# Patient Record
Sex: Female | Born: 1950
Health system: Southern US, Community
[De-identification: ages and names within clinical notes are randomized; demographics above are authoritative.]

## PROBLEM LIST (undated history)

## (undated) DIAGNOSIS — Z8619 Personal history of other infectious and parasitic diseases: Secondary | ICD-10-CM

## (undated) DIAGNOSIS — T7840XA Allergy, unspecified, initial encounter: Secondary | ICD-10-CM

## (undated) DIAGNOSIS — Z8669 Personal history of other diseases of the nervous system and sense organs: Secondary | ICD-10-CM

## (undated) DIAGNOSIS — Z8709 Personal history of other diseases of the respiratory system: Secondary | ICD-10-CM

## (undated) DIAGNOSIS — S0990XA Unspecified injury of head, initial encounter: Secondary | ICD-10-CM

## (undated) DIAGNOSIS — K449 Diaphragmatic hernia without obstruction or gangrene: Secondary | ICD-10-CM

## (undated) DIAGNOSIS — F419 Anxiety disorder, unspecified: Secondary | ICD-10-CM

## (undated) DIAGNOSIS — I499 Cardiac arrhythmia, unspecified: Secondary | ICD-10-CM

## (undated) DIAGNOSIS — E119 Type 2 diabetes mellitus without complications: Secondary | ICD-10-CM

## (undated) DIAGNOSIS — K5790 Diverticulosis of intestine, part unspecified, without perforation or abscess without bleeding: Secondary | ICD-10-CM

## (undated) DIAGNOSIS — I10 Essential (primary) hypertension: Secondary | ICD-10-CM

## (undated) DIAGNOSIS — N200 Calculus of kidney: Secondary | ICD-10-CM

## (undated) DIAGNOSIS — M1990 Unspecified osteoarthritis, unspecified site: Secondary | ICD-10-CM

## (undated) DIAGNOSIS — Z9289 Personal history of other medical treatment: Secondary | ICD-10-CM

## (undated) DIAGNOSIS — E785 Hyperlipidemia, unspecified: Secondary | ICD-10-CM

## (undated) DIAGNOSIS — Z872 Personal history of diseases of the skin and subcutaneous tissue: Secondary | ICD-10-CM

## (undated) HISTORY — DX: Unspecified injury of head, initial encounter: S09.90XA

## (undated) HISTORY — DX: Cardiac arrhythmia, unspecified: I49.9

## (undated) HISTORY — DX: Personal history of other medical treatment: Z92.89

## (undated) HISTORY — PX: CATARACT EXTRACTION: SUR2

## (undated) HISTORY — DX: Allergy, unspecified, initial encounter: T78.40XA

## (undated) HISTORY — DX: Type 2 diabetes mellitus without complications: E11.9

## (undated) HISTORY — DX: Personal history of diseases of the skin and subcutaneous tissue: Z87.2

## (undated) HISTORY — DX: Essential (primary) hypertension: I10

## (undated) HISTORY — PX: CARPAL TUNNEL RELEASE: SHX101

## (undated) HISTORY — DX: Diverticulosis of intestine, part unspecified, without perforation or abscess without bleeding: K57.90

## (undated) HISTORY — PX: TRIGGER FINGER RELEASE: SHX641

## (undated) HISTORY — DX: Personal history of other diseases of the nervous system and sense organs: Z86.69

## (undated) HISTORY — PX: TUBAL LIGATION: SHX77

## (undated) HISTORY — DX: Personal history of other diseases of the respiratory system: Z87.09

## (undated) HISTORY — DX: Personal history of other infectious and parasitic diseases: Z86.19

## (undated) HISTORY — DX: Diaphragmatic hernia without obstruction or gangrene: K44.9

## (undated) HISTORY — DX: Hyperlipidemia, unspecified: E78.5

## (undated) HISTORY — PX: EYE SURGERY: SHX253

## (undated) HISTORY — DX: Anxiety disorder, unspecified: F41.9

## (undated) HISTORY — DX: Calculus of kidney: N20.0

## (undated) HISTORY — DX: Unspecified osteoarthritis, unspecified site: M19.90

---

## 2002-12-17 LAB — HM COLONOSCOPY: HM Colonoscopy: NORMAL

## 2003-05-03 ENCOUNTER — Other Ambulatory Visit: Payer: Self-pay

## 2004-01-17 ENCOUNTER — Ambulatory Visit: Payer: Self-pay | Admitting: Gastroenterology

## 2011-03-04 DIAGNOSIS — S0990XA Unspecified injury of head, initial encounter: Secondary | ICD-10-CM

## 2011-03-04 HISTORY — DX: Unspecified injury of head, initial encounter: S09.90XA

## 2011-12-22 LAB — HM MAMMOGRAPHY: HM Mammogram: NORMAL

## 2012-12-14 LAB — HM PAP SMEAR: HM Pap smear: NEGATIVE

## 2012-12-14 LAB — HM MAMMOGRAPHY: HM Mammogram: NORMAL

## 2012-12-16 ENCOUNTER — Other Ambulatory Visit (HOSPITAL_COMMUNITY)
Admission: RE | Admit: 2012-12-16 | Discharge: 2012-12-16 | Disposition: A | Discharge status: Home / Self Care | Payer: Federal, State, Local not specified - PPO | Source: Ambulatory Visit | Attending: Internal Medicine | Admitting: Internal Medicine

## 2012-12-16 ENCOUNTER — Ambulatory Visit (INDEPENDENT_AMBULATORY_CARE_PROVIDER_SITE_OTHER): Payer: Federal, State, Local not specified - PPO | Admitting: Internal Medicine

## 2012-12-16 ENCOUNTER — Encounter: Payer: Self-pay | Admitting: Internal Medicine

## 2012-12-16 ENCOUNTER — Encounter (INDEPENDENT_AMBULATORY_CARE_PROVIDER_SITE_OTHER): Payer: Self-pay

## 2012-12-16 VITALS — BP 142/80 | HR 55 | Temp 97.9°F | Ht 64.25 in | Wt 160.5 lb

## 2012-12-16 DIAGNOSIS — R200 Anesthesia of skin: Secondary | ICD-10-CM | POA: Insufficient documentation

## 2012-12-16 DIAGNOSIS — Z01419 Encounter for gynecological examination (general) (routine) without abnormal findings: Secondary | ICD-10-CM | POA: Insufficient documentation

## 2012-12-16 DIAGNOSIS — Z23 Encounter for immunization: Secondary | ICD-10-CM

## 2012-12-16 DIAGNOSIS — R7309 Other abnormal glucose: Secondary | ICD-10-CM

## 2012-12-16 DIAGNOSIS — I10 Essential (primary) hypertension: Secondary | ICD-10-CM | POA: Insufficient documentation

## 2012-12-16 DIAGNOSIS — R209 Unspecified disturbances of skin sensation: Secondary | ICD-10-CM

## 2012-12-16 DIAGNOSIS — L409 Psoriasis, unspecified: Secondary | ICD-10-CM | POA: Insufficient documentation

## 2012-12-16 DIAGNOSIS — Z0000 Encounter for general adult medical examination without abnormal findings: Secondary | ICD-10-CM

## 2012-12-16 DIAGNOSIS — Z1151 Encounter for screening for human papillomavirus (HPV): Secondary | ICD-10-CM | POA: Insufficient documentation

## 2012-12-16 DIAGNOSIS — Z79899 Other long term (current) drug therapy: Secondary | ICD-10-CM

## 2012-12-16 DIAGNOSIS — R739 Hyperglycemia, unspecified: Secondary | ICD-10-CM | POA: Insufficient documentation

## 2012-12-16 DIAGNOSIS — L408 Other psoriasis: Secondary | ICD-10-CM

## 2012-12-16 LAB — COMPREHENSIVE METABOLIC PANEL
ALT: 18 U/L (ref 0–35)
AST: 25 U/L (ref 0–37)
Albumin: 4.1 g/dL (ref 3.5–5.2)
Alkaline Phosphatase: 58 U/L (ref 39–117)
Creatinine, Ser: 0.6 mg/dL (ref 0.4–1.2)
Glucose, Bld: 116 mg/dL — ABNORMAL HIGH (ref 70–99)
Potassium: 4 mEq/L (ref 3.5–5.1)
Sodium: 141 mEq/L (ref 135–145)
Total Bilirubin: 1.1 mg/dL (ref 0.3–1.2)
Total Protein: 7 g/dL (ref 6.0–8.3)

## 2012-12-16 LAB — LIPID PANEL
Cholesterol: 225 mg/dL — ABNORMAL HIGH (ref 0–200)
HDL: 60.4 mg/dL (ref >39.00)
Total CHOL/HDL Ratio: 4

## 2012-12-16 LAB — CBC WITH DIFFERENTIAL/PLATELET
Basophils Absolute: 0 10*3/uL (ref 0.0–0.1)
Basophils Relative: 0.6 % (ref 0.0–3.0)
Eosinophils Absolute: 0.1 10*3/uL (ref 0.0–0.7)
HCT: 41 % (ref 36.0–46.0)
MCHC: 33.8 g/dL (ref 30.0–36.0)
MCV: 89.3 fl (ref 78.0–100.0)
Monocytes Absolute: 0.4 10*3/uL (ref 0.1–1.0)
Neutro Abs: 3.1 10*3/uL (ref 1.4–7.7)
Neutrophils Relative %: 54.9 % (ref 43.0–77.0)
RBC: 4.6 Mil/uL (ref 3.87–5.11)
RDW: 12.8 % (ref 11.5–14.6)

## 2012-12-16 LAB — LDL CHOLESTEROL, DIRECT: Direct LDL: 149.5 mg/dL

## 2012-12-16 LAB — MICROALBUMIN / CREATININE URINE RATIO: Microalb, Ur: 0.3 mg/dL (ref 0.0–1.9)

## 2012-12-16 LAB — TSH: TSH: 1.29 u[IU]/mL (ref 0.35–5.50)

## 2012-12-16 LAB — HEMOGLOBIN A1C: Hgb A1c MFr Bld: 6.1 % (ref 4.6–6.5)

## 2012-12-16 MED ORDER — LOSARTAN POTASSIUM 100 MG PO TABS: 100.0000 mg | ORAL_TABLET | Freq: Every day | ORAL | Status: DC

## 2012-12-16 NOTE — Assessment & Plan Note (Signed)
Exam normal. Unclear etiology of paresthesia. Will check B12, TSH, CMP, CBC, A1c with labs.

## 2012-12-16 NOTE — Addendum Note (Signed)
Addended by: Montine Circle D on: 12/16/2012 08:46 AM   Modules accepted: Orders

## 2012-12-16 NOTE — Assessment & Plan Note (Signed)
Symptoms appear to be well controlled. Will continue to monitor.

## 2012-12-16 NOTE — Assessment & Plan Note (Signed)
Last A1c 6%. Will recheck A1c with labs today.

## 2012-12-16 NOTE — Assessment & Plan Note (Signed)
BP slightly elevated today, however much lower at home by report. Will change Losartan-HCTZ to plain Losartan given low BP at home and dehydration with elevated BUN/Cr based on recent labs by former PCP. Repeat labs today. Follow up 4 weeks and prn.

## 2012-12-16 NOTE — Progress Notes (Signed)
Subjective:    Patient ID: Molly Gregory, female    DOB: 11-Mar-1950, 62 y.o.   MRN: [id number]  HPI 62YO female with h/o hypertension presents to establish care and for physical exam. She is generally feeling well. No concerns today. However, brings record of labs from former PCP 05/2012 which show elevated BUN/Cr ratio. She notes BP at home often low, <100/60, and she occasionally feels lightheaded during these times. Questions if she could stop the HCTZ.  Also concerned about occasional numbness or tingling in her toes. This comes and goes. No h/o wounds on her feet. Was noted to have elevated BG, and last A1c 6%. Never on medication for BG.  Due for mammogram. Uncertain if due for colonoscopy. Due for influenza vaccine.  Outpatient Encounter Prescriptions as of 12/16/2012  Medication Sig Dispense Refill  . Calcium Carbonate-Vitamin D (CALCIUM + D PO) Take by mouth daily.      . citalopram (CELEXA) 10 MG tablet Take 10 mg by mouth daily.      Marland Kitchen losartan (COZAAR) 100 MG tablet Take 1 tablet (100 mg total) by mouth daily.  90 tablet  3  . propranolol ER (INDERAL LA) 80 MG 24 hr capsule Take 80 mg by mouth daily.      . psyllium (METAMUCIL) 58.6 % packet Take 1 packet by mouth daily.      . [DISCONTINUED] losartan-hydrochlorothiazide (HYZAAR) 100-25 MG per tablet Take 1 tablet by mouth daily.       No facility-administered encounter medications on file as of 12/16/2012.   BP 142/80  Pulse 55  Temp(Src) 97.9 F (36.6 C) (Oral)  Ht 5' 4.25" (1.632 m)  Wt 160 lb 8 oz (72.802 kg)  BMI 27.33 kg/m2  SpO2 95%  Review of Systems  Constitutional: Negative for fever, chills, appetite change, fatigue and unexpected weight change.  HENT: Negative for congestion, ear pain, sinus pressure, sore throat, trouble swallowing and voice change.   Eyes: Negative for visual disturbance.  Respiratory: Negative for cough, shortness of breath, wheezing and stridor.   Cardiovascular: Negative for chest  pain, palpitations and leg swelling.  Gastrointestinal: Negative for nausea, vomiting, abdominal pain, diarrhea, constipation, blood in stool, abdominal distention and anal bleeding.  Genitourinary: Negative for dysuria and flank pain.  Musculoskeletal: Negative for arthralgias, gait problem, myalgias and neck pain.  Skin: Negative for color change and rash.  Neurological: Positive for numbness. Negative for dizziness and headaches.  Hematological: Negative for adenopathy. Does not bruise/bleed easily.  Psychiatric/Behavioral: Negative for suicidal ideas, sleep disturbance and dysphoric mood. The patient is not nervous/anxious.        Objective:   Physical Exam  Constitutional: She is oriented to person, place, and time. She appears well-developed and well-nourished. No distress.  HENT:  Head: Normocephalic and atraumatic.  Right Ear: External ear normal.  Left Ear: External ear normal.  Nose: Nose normal.  Mouth/Throat: Oropharynx is clear and moist. No oropharyngeal exudate.  Eyes: Conjunctivae are normal. Pupils are equal, round, and reactive to light. Right eye exhibits no discharge. Left eye exhibits no discharge. No scleral icterus.  Neck: Normal range of motion. Neck supple. No tracheal deviation present. No thyromegaly present.  Cardiovascular: Normal rate, regular rhythm, normal heart sounds and intact distal pulses.  Exam reveals no gallop and no friction rub.   No murmur heard. Pulmonary/Chest: Effort normal and breath sounds normal. No accessory muscle usage. Not tachypneic. No respiratory distress. She has no decreased breath sounds. She has no wheezes. She has  no rhonchi. She has no rales. She exhibits no tenderness.  Abdominal: Soft. Bowel sounds are normal. She exhibits no distension and no mass. There is no tenderness. There is no rebound and no guarding.  Genitourinary: Rectum normal, vagina normal and uterus normal. No breast swelling, tenderness, discharge or bleeding.  Pelvic exam was performed with patient supine. There is no rash, tenderness or lesion on the right labia. There is no rash, tenderness or lesion on the left labia. Uterus is not enlarged and not tender. Cervix exhibits no motion tenderness, no discharge and no friability. Right adnexum displays no mass, no tenderness and no fullness. Left adnexum displays no mass, no tenderness and no fullness. No erythema or tenderness around the vagina. No vaginal discharge found.  Musculoskeletal: Normal range of motion. She exhibits no edema and no tenderness.  Lymphadenopathy:    She has no cervical adenopathy.  Neurological: She is alert and oriented to person, place, and time. No cranial nerve deficit. She exhibits normal muscle tone. Coordination normal.  Skin: Skin is warm and dry. No rash noted. She is not diaphoretic. No erythema. No pallor.  Psychiatric: She has a normal mood and affect. Her behavior is normal. Judgment and thought content normal.          Assessment & Plan:

## 2012-12-21 ENCOUNTER — Encounter: Payer: Self-pay | Admitting: Internal Medicine

## 2012-12-27 ENCOUNTER — Encounter: Payer: Self-pay | Admitting: Internal Medicine

## 2013-01-03 ENCOUNTER — Ambulatory Visit (INDEPENDENT_AMBULATORY_CARE_PROVIDER_SITE_OTHER): Payer: Federal, State, Local not specified - PPO | Admitting: Internal Medicine

## 2013-01-03 ENCOUNTER — Encounter: Payer: Self-pay | Admitting: Internal Medicine

## 2013-01-03 VITALS — BP 140/80 | HR 56 | Temp 98.4°F | Ht 64.25 in | Wt 165.5 lb

## 2013-01-03 DIAGNOSIS — I10 Essential (primary) hypertension: Secondary | ICD-10-CM

## 2013-01-03 DIAGNOSIS — J0100 Acute maxillary sinusitis, unspecified: Secondary | ICD-10-CM

## 2013-01-03 MED ORDER — CITALOPRAM HYDROBROMIDE 10 MG PO TABS: 10.0000 mg | ORAL_TABLET | Freq: Every day | ORAL | Status: DC

## 2013-01-03 MED ORDER — DOXYCYCLINE HYCLATE 100 MG PO TABS: 100.0000 mg | ORAL_TABLET | Freq: Two times a day (BID) | ORAL | Status: DC

## 2013-01-03 MED ORDER — PROPRANOLOL HCL ER 80 MG PO CP24: 80.0000 mg | ORAL_CAPSULE | Freq: Every day | ORAL | Status: DC

## 2013-01-03 NOTE — Progress Notes (Signed)
Subjective:    Patient ID: Molly Gregory, female    DOB: 1950/08/03, 62 y.o.   MRN: [id number]  HPI 62YO female presents to follow up hypertension. Seen 2 weeks ago, HCTZ stopped because of episodes of lightheadedness and dehydration noted on labs. Tolerating Losartan and Propranolol well. BP typically 110-120s/60s. No headache, chest pain, dyspnea, palpitations.  Concerned today about 2 weeks of maxillary sinus pressure, pain, purulent nasal congestion. Using saline washes with no improvement. Also notes occasional dizziness. No ear pain or change in hearing. NO fever, chills, cough, dyspnea.   Outpatient Encounter Prescriptions as of 01/03/2013  Medication Sig  . Calcium Carbonate-Vitamin D (CALCIUM + D PO) Take by mouth daily.  . citalopram (CELEXA) 10 MG tablet Take 1 tablet (10 mg total) by mouth daily.  Marland Kitchen losartan (COZAAR) 100 MG tablet Take 1 tablet (100 mg total) by mouth daily.  . Multiple Vitamins-Minerals (WOMENS MULTIVITAMIN PLUS PO) Take by mouth.  . propranolol ER (INDERAL LA) 80 MG 24 hr capsule Take 1 capsule (80 mg total) by mouth daily.  . psyllium (METAMUCIL) 58.6 % packet Take 1 packet by mouth daily.   BP 140/80  Pulse 56  Temp(Src) 98.4 F (36.9 C) (Oral)  Ht 5' 4.25" (1.632 m)  Wt 165 lb 8 oz (75.07 kg)  BMI 28.19 kg/m2  SpO2 97%  Review of Systems  Constitutional: Negative for fever, chills and unexpected weight change.  HENT: Positive for congestion, postnasal drip, rhinorrhea and sinus pressure. Negative for ear discharge, ear pain, facial swelling, hearing loss, mouth sores, nosebleeds, sneezing, sore throat, tinnitus, trouble swallowing and voice change.   Eyes: Negative for pain, discharge, redness and visual disturbance.  Respiratory: Negative for cough, chest tightness, shortness of breath, wheezing and stridor.   Cardiovascular: Negative for chest pain, palpitations and leg swelling.  Musculoskeletal: Negative for arthralgias, myalgias, neck pain  and neck stiffness.  Skin: Negative for color change and rash.  Neurological: Negative for dizziness, weakness, light-headedness and headaches.  Hematological: Negative for adenopathy.       Objective:   Physical Exam  Constitutional: She is oriented to person, place, and time. She appears well-developed and well-nourished. No distress.  HENT:  Head: Normocephalic and atraumatic.  Right Ear: External ear normal. Tympanic membrane is erythematous and bulging. A middle ear effusion is present.  Left Ear: External ear normal. Tympanic membrane is erythematous and bulging. A middle ear effusion is present.  Nose: Mucosal edema present. Right sinus exhibits maxillary sinus tenderness. Left sinus exhibits maxillary sinus tenderness.  Mouth/Throat: Oropharynx is clear and moist. No oropharyngeal exudate.  Eyes: Conjunctivae are normal. Pupils are equal, round, and reactive to light. Right eye exhibits no discharge. Left eye exhibits no discharge. No scleral icterus.  Neck: Normal range of motion. Neck supple. No tracheal deviation present. No thyromegaly present.  Cardiovascular: Normal rate, regular rhythm, normal heart sounds and intact distal pulses.  Exam reveals no gallop and no friction rub.   No murmur heard. Pulmonary/Chest: Effort normal and breath sounds normal. No accessory muscle usage. Not tachypneic. No respiratory distress. She has no decreased breath sounds. She has no wheezes. She has no rhonchi. She has no rales. She exhibits no tenderness.  Musculoskeletal: Normal range of motion. She exhibits no edema and no tenderness.  Lymphadenopathy:    She has no cervical adenopathy.  Neurological: She is alert and oriented to person, place, and time. No cranial nerve deficit. She exhibits normal muscle tone. Coordination normal.  Skin: Skin is  warm and dry. No rash noted. She is not diaphoretic. No erythema. No pallor.  Psychiatric: She has a normal mood and affect. Her behavior is  normal. Judgment and thought content normal.          Assessment & Plan:

## 2013-01-03 NOTE — Assessment & Plan Note (Signed)
Symptoms and exam consistent with acute maxillary sinusitis. Will start Doxycycline. Continue prn saline nasal wash and prn tylenol for pain. Follow up if symptoms not improving over the next week.

## 2013-01-03 NOTE — Assessment & Plan Note (Signed)
BP much better controlled at home 110-120s/60s, slightly elevated here likely related to anxiety with visit. Tolerating Losartan well. Will continue Losartan and Propranolol. Follow up 6 months and prn.

## 2013-02-03 ENCOUNTER — Telehealth: Payer: Self-pay | Admitting: Internal Medicine

## 2013-02-03 ENCOUNTER — Telehealth: Payer: Self-pay | Admitting: *Deleted

## 2013-02-03 MED ORDER — AMLODIPINE BESYLATE 2.5 MG PO TABS: 2.5000 mg | ORAL_TABLET | Freq: Every day | ORAL | Status: DC

## 2013-02-03 NOTE — Telephone Encounter (Signed)
Lets try adding Amlodipine 2.5mg  daily. #30 with 3 refills and bring her in next week for BP recheck.

## 2013-02-03 NOTE — Telephone Encounter (Signed)
The patient's blood pressure has been running high  . She was forwarded to Call a Nurse.

## 2013-02-03 NOTE — Telephone Encounter (Signed)
Spoke with patient, she state her BP medication was changed by Dr. Dan Humphreys. She was on Losartan/HCTZ but then she switched to regular Losartan without the diurectic. BP last night it was 177/65 and today it was 161/60 173/79. She spoke with pharmacist, she told her she may need to call her doctor office to see if she need to change her medication. No other symptoms

## 2013-02-03 NOTE — Telephone Encounter (Signed)
Patient informed and will call back to schedule an appointment. Rx sent to pharmacy on file per patient request.

## 2013-02-05 ENCOUNTER — Encounter: Payer: Self-pay | Admitting: Internal Medicine

## 2013-02-07 ENCOUNTER — Telehealth: Payer: Self-pay | Admitting: *Deleted

## 2013-02-07 ENCOUNTER — Ambulatory Visit (INDEPENDENT_AMBULATORY_CARE_PROVIDER_SITE_OTHER): Payer: Federal, State, Local not specified - PPO | Admitting: *Deleted

## 2013-02-07 ENCOUNTER — Encounter: Payer: Self-pay | Admitting: Internal Medicine

## 2013-02-07 VITALS — BP 178/70 | HR 52

## 2013-02-07 DIAGNOSIS — I10 Essential (primary) hypertension: Secondary | ICD-10-CM

## 2013-02-07 NOTE — Progress Notes (Signed)
Pt presents to office for blood pressure check. BP readings at home have been elevated. Started on Amlodipine 2.5 mg last week for elevated readings. Confirmed she is also taking her Losartan and Propanolol daily as directed. BP readings at home: 176/81 30 minutes after meds yesterday morning; 125/65; 161/80 yesterday afternoon; 175/79; and 180/81 this morning. Pt states a mild headache for weeks but denies any shest pain, shortness of breath. Using pt's cuff, BP here 157/77. My check was 178/70. Dr. Dan Humphreys notified.

## 2013-02-07 NOTE — Telephone Encounter (Signed)
Pt presents to office for blood pressure check. BP readings at home have been elevated. Started on Amlodipine 2.5 mg last week for elevated readings. Confirmed she is also taking her Losartan and Propanolol daily as directed. BP readings at home: 176/81 30 minutes after meds yesterday morning; 125/65; 161/80 yesterday afternoon; 175/79; and 180/81 this morning. Pt states a mild headache for weeks but denies any shest pain, shortness of breath. Using pt's cuff, BP here 157/77. My check was 178/70. Dr. Dan Humphreys notified. Pt would like call with what to do.

## 2013-02-07 NOTE — Telephone Encounter (Signed)
Let's have her increase Amlodipine to 5mg  daily and monitor BP daily. Please have her email with readings.

## 2013-02-07 NOTE — Telephone Encounter (Signed)
Pt notified and verbalized understanding.

## 2013-02-09 ENCOUNTER — Encounter: Payer: Self-pay | Admitting: Internal Medicine

## 2013-02-10 ENCOUNTER — Encounter: Payer: Self-pay | Admitting: Internal Medicine

## 2013-02-11 ENCOUNTER — Encounter: Payer: Self-pay | Admitting: Internal Medicine

## 2013-02-11 ENCOUNTER — Ambulatory Visit (INDEPENDENT_AMBULATORY_CARE_PROVIDER_SITE_OTHER): Payer: Federal, State, Local not specified - PPO | Admitting: Internal Medicine

## 2013-02-11 VITALS — BP 158/82 | HR 57 | Wt 164.0 lb

## 2013-02-11 DIAGNOSIS — I10 Essential (primary) hypertension: Secondary | ICD-10-CM

## 2013-02-11 MED ORDER — AMLODIPINE BESYLATE 10 MG PO TABS: 10.0000 mg | ORAL_TABLET | Freq: Every day | ORAL | Status: DC

## 2013-02-11 NOTE — Patient Instructions (Signed)
Increase Amlodipine to 10mg  daily. Continue Losartan and Propranolol. We will schedule an ultrasound of your kidneys.  Monitor blood pressure at home and email with readings.  Follow up in 4 weeks or sooner as needed.

## 2013-02-11 NOTE — Progress Notes (Signed)
Pre visit review using our clinic review tool, if applicable. No additional management support is needed unless otherwise documented below in the visit note. 

## 2013-02-12 NOTE — Progress Notes (Signed)
Subjective:    Patient ID: Molly Gregory, female    DOB: Jul 13, 1950, 62 y.o.   MRN: [id number]  HPI 62YO female with hypertension presents for follow. BP have been running higher over last few weeks. We recently increased Amlodipine to 5mg  daily, however BP has been near 140-170s/80-100s. She denies chest pain, headache, palpitations. She has been more anxious with getting ready for the holidays. No other concerns today.  Outpatient Encounter Prescriptions as of 02/11/2013  Medication Sig  . amLODipine (NORVASC) 10 MG tablet Take 1 tablet (10 mg total) by mouth daily.  . Calcium Carbonate-Vitamin D (CALCIUM + D PO) Take by mouth daily.  . citalopram (CELEXA) 10 MG tablet Take 1 tablet (10 mg total) by mouth daily.  Marland Kitchen losartan (COZAAR) 100 MG tablet Take 1 tablet (100 mg total) by mouth daily.  . Multiple Vitamins-Minerals (WOMENS MULTIVITAMIN PLUS PO) Take by mouth.  . propranolol ER (INDERAL LA) 80 MG 24 hr capsule Take 1 capsule (80 mg total) by mouth daily.  . psyllium (METAMUCIL) 58.6 % packet Take 1 packet by mouth daily.  . [DISCONTINUED] amLODipine (NORVASC) 2.5 MG tablet Take 1 tablet (2.5 mg total) by mouth daily.   BP 158/82  Pulse 57  Wt 164 lb (74.39 kg)  SpO2 98%  Review of Systems  Constitutional: Negative for fever, chills, appetite change, fatigue and unexpected weight change.  HENT: Negative for congestion, ear pain, sinus pressure, sore throat, trouble swallowing and voice change.   Eyes: Negative for visual disturbance.  Respiratory: Negative for cough, shortness of breath, wheezing and stridor.   Cardiovascular: Negative for chest pain, palpitations and leg swelling.  Gastrointestinal: Negative for nausea, vomiting, abdominal pain, diarrhea, constipation, blood in stool, abdominal distention and anal bleeding.  Genitourinary: Negative for dysuria and flank pain.  Musculoskeletal: Negative for arthralgias, gait problem, myalgias and neck pain.  Skin: Negative  for color change and rash.  Neurological: Negative for dizziness and headaches.  Hematological: Negative for adenopathy. Does not bruise/bleed easily.  Psychiatric/Behavioral: Negative for suicidal ideas, sleep disturbance and dysphoric mood. The patient is not nervous/anxious.        Objective:   Physical Exam  Constitutional: She is oriented to person, place, and time. She appears well-developed and well-nourished. No distress.  HENT:  Head: Normocephalic and atraumatic.  Right Ear: External ear normal.  Left Ear: External ear normal.  Nose: Nose normal.  Mouth/Throat: Oropharynx is clear and moist. No oropharyngeal exudate.  Eyes: Conjunctivae are normal. Pupils are equal, round, and reactive to light. Right eye exhibits no discharge. Left eye exhibits no discharge. No scleral icterus.  Neck: Normal range of motion. Neck supple. No tracheal deviation present. No thyromegaly present.  Cardiovascular: Normal rate, regular rhythm, normal heart sounds and intact distal pulses.  Exam reveals no gallop and no friction rub.   No murmur heard. Pulmonary/Chest: Effort normal and breath sounds normal. No accessory muscle usage. Not tachypneic. No respiratory distress. She has no decreased breath sounds. She has no wheezes. She has no rhonchi. She has no rales. She exhibits no tenderness.  Musculoskeletal: Normal range of motion. She exhibits no edema and no tenderness.  Lymphadenopathy:    She has no cervical adenopathy.  Neurological: She is alert and oriented to person, place, and time. No cranial nerve deficit. She exhibits normal muscle tone. Coordination normal.  Skin: Skin is warm and dry. No rash noted. She is not diaphoretic. No erythema. No pallor.  Psychiatric: She has a normal mood  and affect. Her behavior is normal. Judgment and thought content normal.          Assessment & Plan:

## 2013-02-12 NOTE — Assessment & Plan Note (Signed)
BP Readings from Last 3 Encounters:  02/11/13 158/82  02/07/13 178/70  01/03/13 140/80   BP has been more elevated over last few weeks. Will increase Amlodipine to 10mg  daily. Pt will email with BP readings. Follow up 1 month and prn.

## 2013-02-16 ENCOUNTER — Encounter: Payer: Self-pay | Admitting: Internal Medicine

## 2013-02-18 ENCOUNTER — Encounter: Payer: Self-pay | Admitting: Internal Medicine

## 2013-02-21 NOTE — Telephone Encounter (Signed)
Renal artery ultrasound was normal

## 2013-02-28 ENCOUNTER — Encounter: Payer: Self-pay | Admitting: Internal Medicine

## 2013-03-15 ENCOUNTER — Ambulatory Visit (INDEPENDENT_AMBULATORY_CARE_PROVIDER_SITE_OTHER): Payer: Federal, State, Local not specified - PPO | Admitting: Internal Medicine

## 2013-03-15 ENCOUNTER — Encounter: Payer: Self-pay | Admitting: Internal Medicine

## 2013-03-15 VITALS — BP 140/72 | HR 57 | Temp 98.2°F | Wt 166.0 lb

## 2013-03-15 DIAGNOSIS — L408 Other psoriasis: Secondary | ICD-10-CM

## 2013-03-15 DIAGNOSIS — I10 Essential (primary) hypertension: Secondary | ICD-10-CM

## 2013-03-15 DIAGNOSIS — L409 Psoriasis, unspecified: Secondary | ICD-10-CM

## 2013-03-15 DIAGNOSIS — R202 Paresthesia of skin: Secondary | ICD-10-CM

## 2013-03-15 DIAGNOSIS — R200 Anesthesia of skin: Secondary | ICD-10-CM

## 2013-03-15 DIAGNOSIS — R209 Unspecified disturbances of skin sensation: Secondary | ICD-10-CM

## 2013-03-15 MED ORDER — LOSARTAN POTASSIUM 100 MG PO TABS: 100.0000 mg | ORAL_TABLET | Freq: Every day | ORAL | Status: DC

## 2013-03-15 MED ORDER — AMLODIPINE BESYLATE 10 MG PO TABS: 10.0000 mg | ORAL_TABLET | Freq: Every day | ORAL | Status: DC

## 2013-03-15 NOTE — Progress Notes (Signed)
Subjective:    Patient ID: Molly Gregory, female    DOB: 06/25/1950, 63 y.o.   MRN: [id number]  HPI 63 year old female with history of hypertension presents for followup. At her last visit, we increased amlodipine to 10 mg daily. She reports blood pressure has been well-controlled at home, typically less than 130/70. She denies headache, palpitations. She also underwent renal ultrasound and was told the results were normal.   She reports she is generally feeling well. Her only concern is persistent numbness and tingling in both of her feet. Previous evaluation including electrolytes, thyroid function, B12 have all been normal. Blood sugars have been slightly elevated but not in range of diabetes. She notes that she does drink well water at home. She has never had EMG testing.  She is also concerned about history of psoriasis. In the past, she was treated with topical steroid cream with improvement. However, recently she has had worsening symptoms over her back and hands. She was followed by dermatology however her dermatologist has now retired.  Outpatient Encounter Prescriptions as of 03/15/2013  Medication Sig  . amLODipine (NORVASC) 10 MG tablet Take 1 tablet (10 mg total) by mouth daily.  . Calcium Carbonate-Vitamin D (CALCIUM + D PO) Take by mouth daily.  . citalopram (CELEXA) 10 MG tablet Take 1 tablet (10 mg total) by mouth daily.  Marland Kitchen losartan (COZAAR) 100 MG tablet Take 1 tablet (100 mg total) by mouth daily.  . Multiple Vitamins-Minerals (WOMENS MULTIVITAMIN PLUS PO) Take by mouth.  . propranolol ER (INDERAL LA) 80 MG 24 hr capsule Take 1 capsule (80 mg total) by mouth daily.  . psyllium (METAMUCIL) 58.6 % packet Take 1 packet by mouth daily.   BP 140/72  Pulse 57  Temp(Src) 98.2 F (36.8 C) (Oral)  Wt 166 lb (75.297 kg)  SpO2 97%  Review of Systems  Constitutional: Negative for fever, chills, appetite change, fatigue and unexpected weight change.  HENT: Negative for  congestion, ear pain, sinus pressure, sore throat, trouble swallowing and voice change.   Eyes: Negative for visual disturbance.  Respiratory: Negative for cough, shortness of breath, wheezing and stridor.   Cardiovascular: Negative for chest pain, palpitations and leg swelling.  Gastrointestinal: Negative for nausea, vomiting, abdominal pain, diarrhea, constipation, blood in stool, abdominal distention and anal bleeding.  Genitourinary: Negative for dysuria and flank pain.  Musculoskeletal: Negative for arthralgias, gait problem, myalgias and neck pain.  Skin: Negative for color change and rash.  Neurological: Positive for numbness. Negative for dizziness and headaches.  Hematological: Negative for adenopathy. Does not bruise/bleed easily.  Psychiatric/Behavioral: Negative for suicidal ideas, sleep disturbance and dysphoric mood. The patient is not nervous/anxious.        Objective:   Physical Exam  Constitutional: She is oriented to person, place, and time. She appears well-developed and well-nourished. No distress.  HENT:  Head: Normocephalic and atraumatic.  Right Ear: External ear normal.  Left Ear: External ear normal.  Nose: Nose normal.  Mouth/Throat: Oropharynx is clear and moist. No oropharyngeal exudate.  Eyes: Conjunctivae are normal. Pupils are equal, round, and reactive to light. Right eye exhibits no discharge. Left eye exhibits no discharge. No scleral icterus.  Neck: Normal range of motion. Neck supple. No tracheal deviation present. No thyromegaly present.  Cardiovascular: Normal rate, regular rhythm, normal heart sounds and intact distal pulses.  Exam reveals no gallop and no friction rub.   No murmur heard. Pulmonary/Chest: Effort normal and breath sounds normal. No accessory muscle usage. Not  tachypneic. No respiratory distress. She has no decreased breath sounds. She has no wheezes. She has no rhonchi. She has no rales. She exhibits no tenderness.  Musculoskeletal:  Normal range of motion. She exhibits no edema and no tenderness.  Lymphadenopathy:    She has no cervical adenopathy.  Neurological: She is alert and oriented to person, place, and time. No cranial nerve deficit. She exhibits normal muscle tone. Coordination normal.  Skin: Skin is warm and dry. Rash (erythematous plaques over back, hands) noted. She is not diaphoretic. No erythema. No pallor.  Psychiatric: She has a normal mood and affect. Her behavior is normal. Judgment and thought content normal.          Assessment & Plan:

## 2013-03-15 NOTE — Progress Notes (Signed)
Pre-visit discussion using our clinic review tool. No additional management support is needed unless otherwise documented below in the visit note.  

## 2013-03-15 NOTE — Assessment & Plan Note (Signed)
Symptoms are consistent with peripheral neuropathy. Recent blood sugar levels were just slightly elevated, not consistent with DM. B12 was normal. Electrolytes and thyroid function normal. Patient does drink well water. We discussed checking 24-hour urine for heavy metals. She prefers to hold off on this. We also discussed potential referral to neurology for EMG testing. She prefers to hold off at this time.

## 2013-03-15 NOTE — Assessment & Plan Note (Signed)
Chronic psoriasis over entire body. Recently worsening. Will set up dermatology evaluation. Question if she might benefit from [employer].

## 2013-03-15 NOTE — Assessment & Plan Note (Signed)
BP Readings from Last 3 Encounters:  03/15/13 140/72  02/11/13 158/82  02/07/13 178/70   BP better controlled on current regimen. Will continue. Follow up 3 months for BP recheck.

## 2013-03-18 NOTE — Telephone Encounter (Signed)
Renal artery ultrasound was normal 02/21/2013

## 2013-04-06 ENCOUNTER — Telehealth: Payer: Self-pay | Admitting: Internal Medicine

## 2013-04-06 NOTE — Telephone Encounter (Signed)
Relevant patient education assigned to patient using Emmi. ° °

## 2013-06-02 ENCOUNTER — Encounter: Payer: Self-pay | Admitting: *Deleted

## 2013-06-14 ENCOUNTER — Encounter: Payer: Self-pay | Admitting: Internal Medicine

## 2013-06-14 ENCOUNTER — Ambulatory Visit (INDEPENDENT_AMBULATORY_CARE_PROVIDER_SITE_OTHER): Payer: Federal, State, Local not specified - PPO | Admitting: Internal Medicine

## 2013-06-14 VITALS — BP 140/68 | HR 52 | Temp 98.9°F | Wt 166.0 lb

## 2013-06-14 DIAGNOSIS — M5432 Sciatica, left side: Secondary | ICD-10-CM

## 2013-06-14 DIAGNOSIS — M5430 Sciatica, unspecified side: Secondary | ICD-10-CM

## 2013-06-14 DIAGNOSIS — I10 Essential (primary) hypertension: Secondary | ICD-10-CM

## 2013-06-14 LAB — COMPREHENSIVE METABOLIC PANEL
ALT: 19 U/L (ref 0–35)
AST: 24 U/L (ref 0–37)
Albumin: 4.1 g/dL (ref 3.5–5.2)
Alkaline Phosphatase: 63 U/L (ref 39–117)
BUN: 16 mg/dL (ref 6–23)
CO2: 29 mEq/L (ref 19–32)
Calcium: 9.9 mg/dL (ref 8.4–10.5)
Chloride: 103 mEq/L (ref 96–112)
Creatinine, Ser: 0.7 mg/dL (ref 0.4–1.2)
GFR: 97.33 mL/min (ref >60.00)
Glucose, Bld: 109 mg/dL — ABNORMAL HIGH (ref 70–99)
Potassium: 4.4 mEq/L (ref 3.5–5.1)
SODIUM: 139 meq/L (ref 135–145)
TOTAL PROTEIN: 7.4 g/dL (ref 6.0–8.3)
Total Bilirubin: 0.9 mg/dL (ref 0.3–1.2)

## 2013-06-14 NOTE — Progress Notes (Signed)
Pre visit review using our clinic review tool, if applicable. No additional management support is needed unless otherwise documented below in the visit note. 

## 2013-06-14 NOTE — Assessment & Plan Note (Signed)
BP Readings from Last 3 Encounters:  06/14/13 140/68  03/15/13 140/72  02/11/13 158/82   BP well controlled at home. Will continue current medications. Check renal function with labs today.

## 2013-06-14 NOTE — Progress Notes (Signed)
Subjective:    Patient ID: Molly Gregory, female    DOB: 12/07/1950, 63 y.o.   MRN: [id number]  HPI 63YO female presents for follow up.  HTN - BP typically 115-130/60s at home. Compliant with meds. No chest pain, palpitations, headache.  Low back pain - Two weeks ago, had "muscle spasm" in left calf. Jerked leg back and then had some low back pain. Taking 245m ibuprofen with improvement in aching pain. No weakness or numbness noted.   Review of Systems  Constitutional: Negative for fever, chills, appetite change, fatigue and unexpected weight change.  HENT: Negative for congestion, ear pain, sinus pressure, sore throat, trouble swallowing and voice change.   Eyes: Negative for visual disturbance.  Respiratory: Negative for cough, shortness of breath, wheezing and stridor.   Cardiovascular: Negative for chest pain, palpitations and leg swelling.  Gastrointestinal: Negative for nausea, vomiting, abdominal pain, diarrhea, constipation, blood in stool, abdominal distention and anal bleeding.  Genitourinary: Negative for dysuria and flank pain.  Musculoskeletal: Positive for arthralgias, back pain and myalgias. Negative for gait problem and neck pain.  Skin: Positive for color change and rash.  Neurological: Negative for dizziness and headaches.  Hematological: Negative for adenopathy. Does not bruise/bleed easily.  Psychiatric/Behavioral: Negative for suicidal ideas, sleep disturbance and dysphoric mood. The patient is not nervous/anxious.        Objective:    BP 140/68  Pulse 52  Temp(Src) 98.9 F (37.2 C) (Oral)  Wt 166 lb (75.297 kg)  SpO2 98% Physical Exam  Constitutional: She is oriented to person, place, and time. She appears well-developed and well-nourished. No distress.  HENT:  Head: Normocephalic and atraumatic.  Right Ear: External ear normal.  Left Ear: External ear normal.  Nose: Nose normal.  Mouth/Throat: Oropharynx is clear and moist. No oropharyngeal  exudate.  Eyes: Conjunctivae are normal. Pupils are equal, round, and reactive to light. Right eye exhibits no discharge. Left eye exhibits no discharge. No scleral icterus.  Neck: Normal range of motion. Neck supple. No tracheal deviation present. No thyromegaly present.  Cardiovascular: Normal rate, regular rhythm, normal heart sounds and intact distal pulses.  Exam reveals no gallop and no friction rub.   No murmur heard. Pulmonary/Chest: Effort normal and breath sounds normal. No accessory muscle usage. Not tachypneic. No respiratory distress. She has no decreased breath sounds. She has no wheezes. She has no rhonchi. She has no rales. She exhibits no tenderness.  Musculoskeletal: Normal range of motion. She exhibits no edema.       Lumbar back: She exhibits tenderness and pain. She exhibits no bony tenderness, no edema, no deformity and no spasm.  Lymphadenopathy:    She has no cervical adenopathy.  Neurological: She is alert and oriented to person, place, and time. No cranial nerve deficit. She exhibits normal muscle tone. Coordination normal.  Skin: Skin is warm and dry. No rash noted. She is not diaphoretic. No erythema. No pallor.  Psychiatric: She has a normal mood and affect. Her behavior is normal. Judgment and thought content normal.          Assessment & Plan:   Problem List Items Addressed This Visit   Essential hypertension, benign - Primary      BP Readings from Last 3 Encounters:  06/14/13 140/68  03/15/13 140/72  02/11/13 158/82   BP well controlled at home. Will continue current medications. Check renal function with labs today.    Relevant Orders      Comp Met (CMET)  Left sciatic nerve pain     Discussed stretching exercises and use of Ibuprofen 686m tid prn. If no improvement over next few days, consider referral for PT.        Return in about 6 months (around 12/14/2013) for Physical.

## 2013-06-14 NOTE — Assessment & Plan Note (Addendum)
Symptoms consistent with left sciatic nerve pain. Discussed stretching exercises and use of Ibuprofen 600mg  tid prn. If no improvement over next few days, consider referral for PT.

## 2013-06-14 NOTE — Patient Instructions (Signed)
Increase ibuprofen to 600mg  up to three times daily if low back pain is persistent.  Follow up 6 months and as needed.

## 2013-07-05 ENCOUNTER — Ambulatory Visit: Payer: Federal, State, Local not specified - PPO | Admitting: Internal Medicine

## 2013-07-07 ENCOUNTER — Encounter: Payer: Self-pay | Admitting: Internal Medicine

## 2013-08-03 ENCOUNTER — Encounter: Payer: Self-pay | Admitting: Internal Medicine

## 2013-08-16 ENCOUNTER — Other Ambulatory Visit: Payer: Self-pay | Admitting: Internal Medicine

## 2013-08-16 ENCOUNTER — Telehealth: Payer: Self-pay | Admitting: Internal Medicine

## 2013-08-16 NOTE — Telephone Encounter (Signed)
Came in to office with paperwork for jury duty.  States she has been summoned to go for jury duty in July.  States her aunt is in the hospital and pt is her sole caregiver and POA.  Asking if Dr. Gilford Rile can write on the summons for her to take back so she does not have to go to jury duty.  Copy made, placed in Dr. Thomes Dinning box.  Pt would like a call when ready for pick up.

## 2013-08-16 NOTE — Telephone Encounter (Signed)
Last OV 4.14.15, last refill 11.3.14.  Please advise refill

## 2013-08-18 NOTE — Telephone Encounter (Signed)
Form is not to be completed by a physician, it is to be completed by the pt. Notified pt and put form in folder for her to pick up

## 2013-09-13 ENCOUNTER — Other Ambulatory Visit: Payer: Self-pay | Admitting: Internal Medicine

## 2013-11-17 ENCOUNTER — Encounter: Payer: Self-pay | Admitting: Internal Medicine

## 2013-11-17 ENCOUNTER — Ambulatory Visit (INDEPENDENT_AMBULATORY_CARE_PROVIDER_SITE_OTHER): Payer: Federal, State, Local not specified - PPO | Admitting: Internal Medicine

## 2013-11-17 VITALS — BP 128/72 | HR 58 | Temp 98.6°F | Wt 169.0 lb

## 2013-11-17 DIAGNOSIS — R300 Dysuria: Secondary | ICD-10-CM

## 2013-11-17 DIAGNOSIS — R350 Frequency of micturition: Secondary | ICD-10-CM

## 2013-11-17 LAB — POCT URINALYSIS DIPSTICK
Bilirubin, UA: NEGATIVE
Glucose, UA: NEGATIVE
Ketones, UA: NEGATIVE
Leukocytes, UA: NEGATIVE
Nitrite, UA: NEGATIVE
PH UA: 6
Protein, UA: NEGATIVE
RBC UA: NEGATIVE
Spec Grav, UA: <=1.005
Urobilinogen, UA: NEGATIVE

## 2013-11-17 NOTE — Addendum Note (Signed)
Addended by: Lurlean Nanny on: 11/17/2013 11:07 AM   Modules accepted: Orders

## 2013-11-17 NOTE — Progress Notes (Signed)
HPI  Pt presents to the clinic today with c/o urinary frequency and dysuria. She reports this started 2 weeks ago. She has had some intermittent bladder pressure and left sided back pain. She denies fever, chills. She denies vaginal complaints or issues with her bowels. She has tried to drink some cranberry juice. She does drink a lot of water but has not increased the amount she is drinking. She reports that she has never had a UTI in the past.   Review of Systems  Past Medical History  Diagnosis Date  . Arthritis   . Diabetes mellitus without complication   . Allergy   . Hyperlipidemia   . Hypertension   . H/O psoriasis   . History of chicken pox   . Hx of acute bronchitis   . Hx of migraines   . Head injury, closed 2013  . H/O exercise stress test     normal    Family History  Problem Relation Age of Onset  . Hypertension Maternal Aunt   . Diabetes Maternal Grandmother   . Heart disease Maternal Grandmother   . Hypertension Maternal Grandmother   . Diabetes Paternal Grandmother   . Heart disease Paternal Grandmother   . Hypertension Paternal Grandmother     History   Social History  . Marital Status: Married    Spouse Name: N/A    Number of Children: N/A  . Years of Education: N/A   Occupational History  . Not on file.   Social History Main Topics  . Smoking status: Never Smoker   . Smokeless tobacco: Not on file  . Alcohol Use: No  . Drug Use: Not on file  . Sexual Activity: Not on file   Other Topics Concern  . Not on file   Social History Narrative   Lives with husband in [place]. Cat in home. 1 daughter and 2 sons. 7 grandchildren.      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 Penicillins     Constitutional: Denies fever, malaise, fatigue, headache or abrupt weight changes.   GU: Pt reports urgency, frequency and pain with urination. Denies burning sensation,  blood in urine, odor or discharge. Skin: Denies redness, rashes, lesions or ulcercations.   No other specific complaints in a complete review of systems (except as listed in HPI above).    Objective:   Physical Exam  BP 128/72  Pulse 58  Temp(Src) 98.6 F (37 C) (Oral)  Wt 169 lb (76.658 kg)  SpO2 98%  Wt Readings from Last 3 Encounters:  11/17/13 169 lb (76.658 kg)  06/14/13 166 lb (75.297 kg)  03/15/13 166 lb (75.297 kg)    General: Appears her stated age, well developed, well nourished in NAD. Cardiovascular: Normal rate and rhythm. S1,S2 noted.  No murmur, rubs or gallops noted. No JVD or BLE edema. No carotid bruits noted. Pulmonary/Chest: Normal effort and positive vesicular breath sounds. No respiratory distress. No wheezes, rales or ronchi noted.  Abdomen: Soft. Normal bowel sounds, no bruits noted. No distention or masses noted. Liver, spleen and kidneys non palpable. Slightly tender to palpation over the bladder area. Slight CVA tenderness.      Assessment & Plan:   Urgency, Frequency, Dysuria secondary to Cystitis:  Urinalysis: normal Ok to continue cranberry juice OK to take AZO OTC Drink plenty of fluids  RTC  as needed or if symptoms persist.

## 2013-11-17 NOTE — Patient Instructions (Signed)

## 2013-11-17 NOTE — Progress Notes (Signed)
Pre visit review using our clinic review tool, if applicable. No additional management support is needed unless otherwise documented below in the visit note. 

## 2013-11-17 NOTE — Progress Notes (Signed)
Subjective:    Patient ID: Molly Gregory, female    DOB: 1950/07/12, 63 y.o.   MRN: [id number]  HPI HPI  Pt presents to the clinic today with c/o urinary symptoms. Frequency for past 2 weeks, occasional burning and supra pubic pressure, and lower left back pain since last night . She reports a small spot of blood on tissue 2-3 weeks ago. She has tried pure cranberry juice without relief.  She does not have a hx of UTI's.  Review of Systems  Past Medical History  Diagnosis Date  . Arthritis   . Diabetes mellitus without complication   . Allergy   . Hyperlipidemia   . Hypertension   . H/O psoriasis   . History of chicken pox   . Hx of acute bronchitis   . Hx of migraines   . Head injury, closed 2013  . H/O exercise stress test     normal    Family History  Problem Relation Age of Onset  . Hypertension Maternal Aunt   . Diabetes Maternal Grandmother   . Heart disease Maternal Grandmother   . Hypertension Maternal Grandmother   . Diabetes Paternal Grandmother   . Heart disease Paternal Grandmother   . Hypertension Paternal Grandmother     History   Social History  . Marital Status: Married    Spouse Name: N/A    Number of Children: N/A  . Years of Education: N/A   Occupational History  . Not on file.   Social History Main Topics  . Smoking status: Never Smoker   . Smokeless tobacco: Not on file  . Alcohol Use: No  . Drug Use: Not on file  . Sexual Activity: Not on file   Other Topics Concern  . Not on file   Social History Narrative   Lives with husband in [place]. Cat in home. 1 daughter and 2 sons. 7 grandchildren.      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 Penicillins     Constitutional: Denies fever, malaise, fatigue, headache or abrupt weight changes.    No other specific complaints in a complete review of systems (except as listed in HPI above).      Objective:   Physical Exam Filed Vitals:   11/17/13 0855  BP: 128/72  Pulse: 58  Temp: 98.6 F (37 C)    There were no vitals taken for this visit. Wt Readings from Last 3 Encounters:  06/14/13 166 lb (75.297 kg)  03/15/13 166 lb (75.297 kg)  02/11/13 164 lb (74.39 kg)    General: Appears her stated age, well developed, well nourished in NAD. Cardiovascular: Normal rate and rhythm. S1,S2 noted.  No murmur, rubs or gallops noted. Pulmonary/Chest: Normal effort and positive vesicular breath sounds. No respiratory distress. No wheezes, rales or ronchi noted.  Abdomen: Soft and nontender. Normal bowel sounds, no bruits noted. No distention or masses noted. Liver, spleen and kidneys non palpable. Non-Tender to palpation over the bladder area, some pressure. Left CVA tenderness.      Assessment & Plan:  Urinary frequency  Urinalysis: clear Increase your fluid intake Cut back on caffeine or anything that would irritate the bladder OK to continue cranberry juice or take AZO  RTC as needed or if symptoms persist.    Review of Systems     Physical Exam

## 2013-11-26 ENCOUNTER — Ambulatory Visit (INDEPENDENT_AMBULATORY_CARE_PROVIDER_SITE_OTHER): Payer: Medicare Other

## 2013-11-26 DIAGNOSIS — Z23 Encounter for immunization: Secondary | ICD-10-CM

## 2013-12-14 ENCOUNTER — Ambulatory Visit (INDEPENDENT_AMBULATORY_CARE_PROVIDER_SITE_OTHER): Payer: Medicare Other | Admitting: Internal Medicine

## 2013-12-14 ENCOUNTER — Encounter: Payer: Self-pay | Admitting: Internal Medicine

## 2013-12-14 VITALS — BP 136/58 | HR 55 | Temp 98.3°F | Ht 64.5 in | Wt 166.5 lb

## 2013-12-14 DIAGNOSIS — Z1211 Encounter for screening for malignant neoplasm of colon: Secondary | ICD-10-CM | POA: Diagnosis not present

## 2013-12-14 DIAGNOSIS — Z8249 Family history of ischemic heart disease and other diseases of the circulatory system: Secondary | ICD-10-CM

## 2013-12-14 DIAGNOSIS — M2550 Pain in unspecified joint: Secondary | ICD-10-CM | POA: Insufficient documentation

## 2013-12-14 DIAGNOSIS — Z7189 Other specified counseling: Secondary | ICD-10-CM | POA: Diagnosis not present

## 2013-12-14 DIAGNOSIS — Z23 Encounter for immunization: Secondary | ICD-10-CM

## 2013-12-14 DIAGNOSIS — Z0000 Encounter for general adult medical examination without abnormal findings: Secondary | ICD-10-CM | POA: Diagnosis not present

## 2013-12-14 DIAGNOSIS — Z634 Disappearance and death of family member: Secondary | ICD-10-CM | POA: Diagnosis not present

## 2013-12-14 DIAGNOSIS — I10 Essential (primary) hypertension: Secondary | ICD-10-CM

## 2013-12-14 LAB — CBC WITH DIFFERENTIAL/PLATELET
Basophils Absolute: 0 10*3/uL (ref 0.0–0.1)
Basophils Relative: 0.5 % (ref 0.0–3.0)
EOS PCT: 2.2 % (ref 0.0–5.0)
Eosinophils Absolute: 0.2 10*3/uL (ref 0.0–0.7)
HEMATOCRIT: 41.5 % (ref 36.0–46.0)
Hemoglobin: 13.6 g/dL (ref 12.0–15.0)
Lymphocytes Relative: 31.9 % (ref 12.0–46.0)
Lymphs Abs: 2.2 10*3/uL (ref 0.7–4.0)
MCHC: 32.7 g/dL (ref 30.0–36.0)
MCV: 89.9 fl (ref 78.0–100.0)
MONOS PCT: 7 % (ref 3.0–12.0)
Monocytes Absolute: 0.5 10*3/uL (ref 0.1–1.0)
Neutro Abs: 4.1 10*3/uL (ref 1.4–7.7)
Neutrophils Relative %: 58.4 % (ref 43.0–77.0)
PLATELETS: 289 10*3/uL (ref 150.0–400.0)
RBC: 4.61 Mil/uL (ref 3.87–5.11)
RDW: 13.4 % (ref 11.5–15.5)
WBC: 7 10*3/uL (ref 4.0–10.5)

## 2013-12-14 LAB — COMPREHENSIVE METABOLIC PANEL
ALK PHOS: 75 U/L (ref 39–117)
ALT: 21 U/L (ref 0–35)
AST: 26 U/L (ref 0–37)
Albumin: 3.8 g/dL (ref 3.5–5.2)
BUN: 15 mg/dL (ref 6–23)
CO2: 25 mEq/L (ref 19–32)
Calcium: 9.5 mg/dL (ref 8.4–10.5)
Chloride: 103 mEq/L (ref 96–112)
Creatinine, Ser: 0.6 mg/dL (ref 0.4–1.2)
GFR: 102.62 mL/min (ref >60.00)
GLUCOSE: 108 mg/dL — AB (ref 70–99)
POTASSIUM: 4.4 meq/L (ref 3.5–5.1)
Sodium: 140 mEq/L (ref 135–145)
TOTAL PROTEIN: 7.5 g/dL (ref 6.0–8.3)
Total Bilirubin: 1.1 mg/dL (ref 0.2–1.2)

## 2013-12-14 LAB — LIPID PANEL
Cholesterol: 212 mg/dL — ABNORMAL HIGH (ref 0–200)
HDL: 51.9 mg/dL (ref >39.00)
LDL Cholesterol: 140 mg/dL — ABNORMAL HIGH (ref 0–99)
NONHDL: 160.1
Total CHOL/HDL Ratio: 4
Triglycerides: 100 mg/dL (ref 0.0–149.0)
VLDL: 20 mg/dL (ref 0.0–40.0)

## 2013-12-14 LAB — HEMOGLOBIN A1C: HEMOGLOBIN A1C: 5.9 % (ref 4.6–6.5)

## 2013-12-14 LAB — VITAMIN D 25 HYDROXY (VIT D DEFICIENCY, FRACTURES): VITD: 32.31 ng/mL (ref 30.00–100.00)

## 2013-12-14 NOTE — Assessment & Plan Note (Signed)
Maternal aunt recently died from AAA. Will set up vascular evaluation for US abdomen to screen for AAA.

## 2013-12-14 NOTE — Assessment & Plan Note (Signed)
Offered support today after recent death of her 89. Encouraged her to seek counseling through [facility]osp[facility].

## 2013-12-14 NOTE — Assessment & Plan Note (Signed)
Chronic arthralgia bilateral toes. Exam remarkable for drop of 2nd metatarsal head left foot. Recommended podiatry referral. She declines for now.

## 2013-12-14 NOTE — Progress Notes (Signed)
The patient is here for annual Medicare Wellness Examination and management of other chronic and acute problems.   The risk factors are reflected in the history.  The roster of all physicians providing medical care to patient - is listed in the Snapshot section of the chart.  Activities of daily living:   The patient is 100% independent in all ADLs: dressing, toileting, feeding as well as independent mobility. Patient lives with husband, Shanon Brow. Lives in a one story home. Has carpeted floors. No falls.  Home safety :  The patient has smoke detectors in the home.  They wear seatbelts in their car. There are no firearms at home.  There is no violence in the home. They feel safe where they live.  Infectious Risks: There is no risks for hepatitis, STDs or HIV.  There is no  history of blood transfusion.  They have no travel history to infectious disease endemic areas of the world.  Additional Health Care Providers: The patient has seen their dentist in the last six months. Dentist - Dr. Yong Channel, [facility] They have seen their eye doctor in the last year. Opthalmologist - Dr. Thomasene Ripple They deny hearing issues. They have deferred audiologic testing in the last year.   They do not  have excessive sun exposure. Discussed the need for sun protection: hats,long sleeves and use of sunscreen if there is significant sun exposure.  Dermatologist - Dr. Sharlett Iles in the past  Diet: the importance of a healthy diet is discussed. They do have a healthy diet.  The benefits of regular aerobic exercise were discussed. Patient exercises by walking aerobics.  Depression screen: there are no signs or vegative symptoms of depression- irritability, change in appetite, anhedonia, sadness/tearfullness.  Cognitive assessment: the patient manages all their financial and personal affairs and is actively engaged. They could relate day,date,year and events.  HCPOA - none in place Living Will - yes  The  following portions of the patient's history were reviewed and updated as appropriate: allergies, current medications, past family history, past medical history,  past surgical history, past social history and problem list.  Visual acuity was not assessed per patient preference as they have regular follow up with their ophthalmologist. Hearing and body mass index were assessed and reviewed.   During the course of the visit the patient was educated and counseled about appropriate screening and preventive services including : fall prevention , diabetes screening, nutrition counseling, colorectal cancer screening, and recommended immunizations.    FOLLOW UP: She is tearful today describing recent death of her aunt from AAA. She cared for her in her home with assistance from Hospice. She has not sought counseling related to this. She is concerned about her own risk of aneurysm, and would like to have screening for this.  She is also concerned about persistent pain in both of her feet distally. Using OTC topical treatment with some improvement. Described as aching and burning. Would like to recheck blood sugars as A1c elevated in the past.  Review of Systems  Constitutional: Negative for fever, chills, appetite change, fatigue and unexpected weight change.  Eyes: Negative for visual disturbance.  Respiratory: Negative for shortness of breath.   Cardiovascular: Negative for chest pain and leg swelling.  Gastrointestinal: Negative for nausea, vomiting, abdominal pain, diarrhea and constipation.  Musculoskeletal: Positive for arthralgias and myalgias.  Skin: Negative for color change and rash.  Neurological: Negative for tremors, weakness, light-headedness and headaches.  Hematological: Negative for adenopathy. Does not bruise/bleed easily.  Psychiatric/Behavioral: Positive  for dysphoric mood. Negative for suicidal ideas and sleep disturbance. The patient is not nervous/anxious.        Objective:     BP 136/58  Pulse 55  Temp(Src) 98.3 F (36.8 C) (Oral)  Ht 5' 4.5" (1.638 m)  Wt 166 lb 8 oz (75.524 kg)  BMI 28.15 kg/m2  SpO2 97% Physical Exam  Constitutional: She is oriented to person, place, and time. She appears well-developed and well-nourished. No distress.  HENT:  Head: Normocephalic and atraumatic.  Right Ear: External ear normal.  Left Ear: External ear normal.  Nose: Nose normal.  Mouth/Throat: Oropharynx is clear and moist. No oropharyngeal exudate.  Eyes: Conjunctivae are normal. Pupils are equal, round, and reactive to light. Right eye exhibits no discharge. Left eye exhibits no discharge. No scleral icterus.  Neck: Normal range of motion. Neck supple. No tracheal deviation present. No thyromegaly present.  Cardiovascular: Normal rate, regular rhythm, normal heart sounds and intact distal pulses.  Exam reveals no gallop and no friction rub.   No murmur heard. Pulmonary/Chest: Effort normal and breath sounds normal. No accessory muscle usage. Not tachypneic. No respiratory distress. She has no decreased breath sounds. She has no wheezes. She has no rhonchi. She has no rales. She exhibits no tenderness. Right breast exhibits no inverted nipple, no mass, no nipple discharge, no skin change and no tenderness. Left breast exhibits no inverted nipple, no mass, no nipple discharge, no skin change and no tenderness. Breasts are symmetrical.  Abdominal: Soft. Bowel sounds are normal. She exhibits no distension and no mass. There is no tenderness. There is no rebound and no guarding.  Musculoskeletal: Normal range of motion. She exhibits no edema and no tenderness.  Lymphadenopathy:    She has no cervical adenopathy.  Neurological: She is alert and oriented to person, place, and time. No cranial nerve deficit or sensory deficit. She exhibits normal muscle tone. Coordination and gait normal.  Skin: Skin is warm and dry. No rash noted. She is not diaphoretic. No erythema. No pallor.   Psychiatric: She has a normal mood and affect. Her behavior is normal. Judgment and thought content normal.          Assessment & Plan:   Problem List Items Addressed This Visit     Unprioritized   Advance care planning     Encouraged her to set up HCPOA. Discussed ways to do this. Living will in place.    Arthralgia     Chronic arthralgia bilateral toes. Exam remarkable for drop of 2nd metatarsal head left foot. Recommended podiatry referral. She declines for now.    Bereavement     Offered support today after recent death of her 93. Encouraged her to seek counseling through [facility]osp[facility].    Family history of aortic aneurysm     Maternal aunt recently died from AAA. Will set up vascular evaluation for US abdomen to screen for AAA.    Relevant Orders      Ambulatory referral to Vascular Surgery   Special screening for malignant neoplasms, colon     Pt prefers not to repeat colonoscopy. Will set up Cologuard testing. Information give to pt today.    Welcome to Medicare preventive visit - Primary     General medical exam including breast exam normal today. PAP and pelvic deferred as PAP normal, HPV neg 2014. Mammogram normal 2015, reviewed today. Colonoscopy 2004, will set up Cologuard testing as pt prefers not to have repeat colonoscopy. Labs today including CBC, CMP,  lipids, A1c. Prevnar today.    Relevant Orders      Comprehensive metabolic panel      Lipid panel      CBC with Differential      Microalbumin / creatinine urine ratio      Vit D  25 hydroxy (rtn osteoporosis monitoring)      HgB A1c    Other Visit Diagnoses   Need for prophylactic vaccination against Streptococcus pneumoniae (pneumococcus)        Relevant Orders       Pneumococcal conjugate vaccine 13-valent (Completed)        Return in about 6 months (around 06/15/2014) for Recheck.

## 2013-12-14 NOTE — Assessment & Plan Note (Signed)
Encouraged her to set up HCPOA. Discussed ways to do this. Living will in place.

## 2013-12-14 NOTE — Assessment & Plan Note (Signed)
Pt prefers not to repeat colonoscopy. Will set up Cologuard testing. Information give to pt today.

## 2013-12-14 NOTE — Patient Instructions (Signed)

## 2013-12-14 NOTE — Assessment & Plan Note (Signed)
General medical exam including breast exam normal today. PAP and pelvic deferred as PAP normal, HPV neg 2014. Mammogram normal 2015, reviewed today. Colonoscopy 2004, will set up Cologuard testing as pt prefers not to have repeat colonoscopy. Labs today including CBC, CMP, lipids, A1c. Prevnar today.

## 2013-12-14 NOTE — Progress Notes (Signed)
Pre visit review using our clinic review tool, if applicable. No additional management support is needed unless otherwise documented below in the visit note. 

## 2013-12-17 ENCOUNTER — Other Ambulatory Visit: Payer: Self-pay | Admitting: Internal Medicine

## 2013-12-30 ENCOUNTER — Encounter: Payer: Self-pay | Admitting: Internal Medicine

## 2014-01-02 DIAGNOSIS — I872 Venous insufficiency (chronic) (peripheral): Secondary | ICD-10-CM | POA: Diagnosis not present

## 2014-01-02 DIAGNOSIS — Z1231 Encounter for screening mammogram for malignant neoplasm of breast: Secondary | ICD-10-CM | POA: Diagnosis not present

## 2014-01-02 DIAGNOSIS — M79609 Pain in unspecified limb: Secondary | ICD-10-CM | POA: Diagnosis not present

## 2014-01-02 DIAGNOSIS — E119 Type 2 diabetes mellitus without complications: Secondary | ICD-10-CM | POA: Diagnosis not present

## 2014-01-02 DIAGNOSIS — I8310 Varicose veins of unspecified lower extremity with inflammation: Secondary | ICD-10-CM | POA: Diagnosis not present

## 2014-01-02 LAB — HM MAMMOGRAPHY: HM MAMMO: NEGATIVE

## 2014-01-03 ENCOUNTER — Ambulatory Visit: Payer: Self-pay | Admitting: Vascular Surgery

## 2014-01-03 DIAGNOSIS — K8020 Calculus of gallbladder without cholecystitis without obstruction: Secondary | ICD-10-CM | POA: Diagnosis not present

## 2014-01-03 DIAGNOSIS — R05 Cough: Secondary | ICD-10-CM | POA: Diagnosis not present

## 2014-01-12 DIAGNOSIS — Z947 Corneal transplant status: Secondary | ICD-10-CM | POA: Diagnosis not present

## 2014-01-13 DIAGNOSIS — R109 Unspecified abdominal pain: Secondary | ICD-10-CM | POA: Diagnosis not present

## 2014-01-13 DIAGNOSIS — M79609 Pain in unspecified limb: Secondary | ICD-10-CM | POA: Diagnosis not present

## 2014-01-16 DIAGNOSIS — Z1211 Encounter for screening for malignant neoplasm of colon: Secondary | ICD-10-CM | POA: Diagnosis not present

## 2014-01-16 DIAGNOSIS — Z1212 Encounter for screening for malignant neoplasm of rectum: Secondary | ICD-10-CM | POA: Diagnosis not present

## 2014-01-16 LAB — COLOGUARD: COLOGUARD: NEGATIVE

## 2014-01-23 ENCOUNTER — Encounter: Payer: Self-pay | Admitting: *Deleted

## 2014-02-07 ENCOUNTER — Encounter: Payer: Self-pay | Admitting: Internal Medicine

## 2014-02-09 ENCOUNTER — Encounter: Payer: Self-pay | Admitting: Internal Medicine

## 2014-02-28 ENCOUNTER — Ambulatory Visit (INDEPENDENT_AMBULATORY_CARE_PROVIDER_SITE_OTHER): Payer: Medicare Other | Admitting: Internal Medicine

## 2014-02-28 ENCOUNTER — Encounter: Payer: Self-pay | Admitting: Internal Medicine

## 2014-02-28 VITALS — BP 113/66 | HR 59 | Temp 98.1°F | Ht 64.5 in | Wt 172.8 lb

## 2014-02-28 DIAGNOSIS — J209 Acute bronchitis, unspecified: Secondary | ICD-10-CM | POA: Insufficient documentation

## 2014-02-28 MED ORDER — HYDROCOD POLST-CHLORPHEN POLST 10-8 MG/5ML PO LQCR: 5.0000 mL | Freq: Two times a day (BID) | ORAL | Status: DC | PRN

## 2014-02-28 MED ORDER — LEVOFLOXACIN 500 MG PO TABS: 500.0000 mg | ORAL_TABLET | Freq: Every day | ORAL | Status: DC

## 2014-02-28 MED ORDER — ALBUTEROL SULFATE HFA 108 (90 BASE) MCG/ACT IN AERS: 2.0000 | INHALATION_SPRAY | Freq: Four times a day (QID) | RESPIRATORY_TRACT | Status: DC | PRN

## 2014-02-28 NOTE — Progress Notes (Signed)
Pre visit review using our clinic review tool, if applicable. No additional management support is needed unless otherwise documented below in the visit note. 

## 2014-02-28 NOTE — Progress Notes (Signed)
Subjective:    Patient ID: Molly Gregory, female    DOB: June 01, 1950, 63 y.o.   MRN: [id number]  HPI 63YO female presents for acute visit.  Cough -Molly Gregory has been ill with similar symptoms. First, she developed sore throat and nasal congestion 2 weeks ago. Now developed cough and chest congestion. Started on Mucinex with no improvement. No fever, chills. Some headache    Past medical, surgical, family and social history per today's encounter.  Review of Systems  Constitutional: Positive for fatigue. Negative for fever, chills and unexpected weight change.  HENT: Positive for congestion, postnasal drip, rhinorrhea and sore throat. Negative for ear discharge, ear pain, facial swelling, hearing loss, mouth sores, nosebleeds, sinus pressure, sneezing, tinnitus, trouble swallowing and voice change.   Eyes: Negative for pain, discharge, redness and visual disturbance.  Respiratory: Positive for cough, chest tightness and shortness of breath. Negative for wheezing and stridor.   Cardiovascular: Negative for chest pain, palpitations and leg swelling.  Musculoskeletal: Negative for myalgias, arthralgias, neck pain and neck stiffness.  Skin: Negative for color change and rash.  Neurological: Negative for dizziness, weakness, light-headedness and headaches.  Hematological: Negative for adenopathy.       Objective:    BP 113/66 mmHg  Pulse 59  Temp(Src) 98.1 F (36.7 C) (Oral)  Ht 5' 4.5" (1.638 m)  Wt 172 lb 12 oz (78.359 kg)  BMI 29.21 kg/m2  SpO2 98% Physical Exam  Constitutional: She is oriented to person, place, and time. She appears well-developed and well-nourished. No distress.  HENT:  Head: Normocephalic and atraumatic.  Right Ear: External ear normal.  Left Ear: External ear normal.  Nose: Nose normal.  Mouth/Throat: Oropharynx is clear and moist. No oropharyngeal exudate.  Eyes: Conjunctivae are normal. Pupils are equal, round, and reactive to light. Right eye  exhibits no discharge. Left eye exhibits no discharge. No scleral icterus.  Neck: Normal range of motion. Neck supple. No tracheal deviation present. No thyromegaly present.  Cardiovascular: Normal rate, regular rhythm, normal heart sounds and intact distal pulses.  Exam reveals no gallop and no friction rub.   No murmur heard. Pulmonary/Chest: Effort normal. No accessory muscle usage. No tachypnea. No respiratory distress. She has no decreased breath sounds. She has no wheezes. She has rhonchi (scattered). She has no rales. She exhibits no tenderness.  Musculoskeletal: Normal range of motion. She exhibits no edema or tenderness.  Lymphadenopathy:    She has no cervical adenopathy.  Neurological: She is alert and oriented to person, place, and time. No cranial nerve deficit. She exhibits normal muscle tone. Coordination normal.  Skin: Skin is warm and dry. No rash noted. She is not diaphoretic. No erythema. No pallor.  Psychiatric: She has a normal mood and affect. Her behavior is normal. Judgment and thought content normal.          Assessment & Plan:   Problem List Items Addressed This Visit      Unprioritized   Acute bronchitis - Primary    Symptoms and exam most consistent with acute bronchitis. Will start Levaquin and prn Albuterol. Tussionex as needed for cough. Will hold Celexa this week given potential interaction with Levaquin (has not recently been taking this medication consistently). Encouraged rest, adequate fluids. Follow up as needed if symptoms are not improving over the next few days.    Relevant Medications      levofloxacin (LEVAQUIN) tablet      ALBUTEROL SULFATE HFA 108 (90 BASE) MCG/ACT IN AERS  chlorpheniramine-HYDROcodone (TUSSIONEX) suspension 8-10 mg/54mL       Return if symptoms worsen or fail to improve.

## 2014-02-28 NOTE — Assessment & Plan Note (Signed)
Symptoms and exam most consistent with acute bronchitis. Will start Levaquin and prn Albuterol. Tussionex as needed for cough. Will hold Celexa this week given potential interaction with Levaquin (has not recently been taking this medication consistently). Encouraged rest, adequate fluids. Follow up as needed if symptoms are not improving over the next few days.

## 2014-02-28 NOTE — Patient Instructions (Signed)
Start Levaquin 500mg  daily.  Hold Celexa while on Levaquin.  Use Albuterol inhaler as needed up to every 6-8 hours as needed.  Start Tussionex as needed for cough up to twice daily.  Follow up next week if symptoms not improving.

## 2014-03-27 ENCOUNTER — Other Ambulatory Visit: Payer: Self-pay | Admitting: Internal Medicine

## 2014-04-29 ENCOUNTER — Other Ambulatory Visit: Payer: Self-pay | Admitting: Internal Medicine

## 2014-06-23 DIAGNOSIS — D0471 Carcinoma in situ of skin of right lower limb, including hip: Secondary | ICD-10-CM | POA: Diagnosis not present

## 2014-06-23 DIAGNOSIS — Z5181 Encounter for therapeutic drug level monitoring: Secondary | ICD-10-CM | POA: Diagnosis not present

## 2014-06-23 DIAGNOSIS — D485 Neoplasm of uncertain behavior of skin: Secondary | ICD-10-CM | POA: Diagnosis not present

## 2014-06-23 DIAGNOSIS — L409 Psoriasis, unspecified: Secondary | ICD-10-CM | POA: Diagnosis not present

## 2014-06-23 DIAGNOSIS — L400 Psoriasis vulgaris: Secondary | ICD-10-CM | POA: Diagnosis not present

## 2014-07-02 ENCOUNTER — Other Ambulatory Visit: Payer: Self-pay | Admitting: Internal Medicine

## 2014-07-03 ENCOUNTER — Encounter: Payer: Self-pay | Admitting: Internal Medicine

## 2014-07-03 ENCOUNTER — Ambulatory Visit (INDEPENDENT_AMBULATORY_CARE_PROVIDER_SITE_OTHER): Payer: Medicare Other | Admitting: Internal Medicine

## 2014-07-03 VITALS — BP 131/70 | HR 56 | Temp 98.2°F | Ht 64.5 in | Wt 175.0 lb

## 2014-07-03 DIAGNOSIS — E663 Overweight: Secondary | ICD-10-CM | POA: Diagnosis not present

## 2014-07-03 DIAGNOSIS — I10 Essential (primary) hypertension: Secondary | ICD-10-CM

## 2014-07-03 DIAGNOSIS — D0471 Carcinoma in situ of skin of right lower limb, including hip: Secondary | ICD-10-CM | POA: Diagnosis not present

## 2014-07-03 DIAGNOSIS — L409 Psoriasis, unspecified: Secondary | ICD-10-CM

## 2014-07-03 MED ORDER — AMLODIPINE BESYLATE 10 MG PO TABS: 10.0000 mg | ORAL_TABLET | Freq: Every day | ORAL | Status: DC

## 2014-07-03 MED ORDER — CITALOPRAM HYDROBROMIDE 10 MG PO TABS: 10.0000 mg | ORAL_TABLET | Freq: Every day | ORAL | Status: DC

## 2014-07-03 MED ORDER — LOSARTAN POTASSIUM 100 MG PO TABS: 100.0000 mg | ORAL_TABLET | Freq: Every day | ORAL | Status: DC

## 2014-07-03 MED ORDER — PROPRANOLOL HCL ER 80 MG PO CP24: 80.0000 mg | ORAL_CAPSULE | Freq: Every day | ORAL | Status: DC

## 2014-07-03 NOTE — Assessment & Plan Note (Signed)
Planning to start Methotrexate. Will request recent labs from Dr. Evorn Gong.

## 2014-07-03 NOTE — Progress Notes (Signed)
Pre visit review using our clinic review tool, if applicable. No additional management support is needed unless otherwise documented below in the visit note. 

## 2014-07-03 NOTE — Assessment & Plan Note (Signed)
BP Readings from Last 3 Encounters:  07/03/14 131/70  02/28/14 113/66  12/14/13 136/58   BP well controlled. Will request recent labs with renal function. Continue current medications.

## 2014-07-03 NOTE — Assessment & Plan Note (Signed)
Wt Readings from Last 3 Encounters:  07/03/14 175 lb (79.379 kg)  02/28/14 172 lb 12 oz (78.359 kg)  12/14/13 166 lb 8 oz (75.524 kg)   Body mass index is 29.59 kg/(m^2). Encouraged healthy diet and exercise with goal of 67min 3x per week.

## 2014-07-03 NOTE — Progress Notes (Signed)
Subjective:    Patient ID: Molly Gregory, female    DOB: 1950-11-13, 64 y.o.   MRN: [id number]  HPI  64YO female presents for follow up.  Psoriasis - Waiting to possibly start Methotrexate. Labs last week pending.  HTN - BP well controlled. At times less than 283/15 with diastolic less than 65. No CP, HA, palpitations.  Concerned about difficulty losing weight. Exercising 5-7 days per week. Tries to follow healthy diet but notes some dietary indiscretion.  Wt Readings from Last 3 Encounters:  07/03/14 175 lb (79.379 kg)  02/28/14 172 lb 12 oz (78.359 kg)  12/14/13 166 lb 8 oz (75.524 kg)     Past medical, surgical, family and social history per today's encounter.  Review of Systems  Constitutional: Negative for fever, chills, appetite change, fatigue and unexpected weight change.  Eyes: Negative for visual disturbance.  Respiratory: Negative for shortness of breath.   Cardiovascular: Negative for chest pain and leg swelling.  Gastrointestinal: Negative for nausea, vomiting, abdominal pain, diarrhea and constipation.  Musculoskeletal: Negative for myalgias and arthralgias.  Skin: Negative for color change and rash.  Hematological: Negative for adenopathy. Does not bruise/bleed easily.  Psychiatric/Behavioral: Negative for sleep disturbance and dysphoric mood. The patient is not nervous/anxious.        Objective:    BP 131/70 mmHg  Pulse 56  Temp(Src) 98.2 F (36.8 C) (Oral)  Ht 5' 4.5" (1.638 m)  Wt 175 lb (79.379 kg)  BMI 29.59 kg/m2  SpO2 95% Physical Exam  Constitutional: She is oriented to person, place, and time. She appears well-developed and well-nourished. No distress.  HENT:  Head: Normocephalic and atraumatic.  Right Ear: External ear normal.  Left Ear: External ear normal.  Nose: Nose normal.  Mouth/Throat: Oropharynx is clear and moist. No oropharyngeal exudate.  Eyes: Conjunctivae are normal. Pupils are equal, round, and reactive to light.  Right eye exhibits no discharge. Left eye exhibits no discharge. No scleral icterus.  Neck: Normal range of motion. Neck supple. No tracheal deviation present. No thyromegaly present.  Cardiovascular: Normal rate, regular rhythm, normal heart sounds and intact distal pulses.  Exam reveals no gallop and no friction rub.   No murmur heard. Pulmonary/Chest: Effort normal and breath sounds normal. No respiratory distress. She has no wheezes. She has no rales. She exhibits no tenderness.  Musculoskeletal: Normal range of motion. She exhibits no edema or tenderness.  Lymphadenopathy:    She has no cervical adenopathy.  Neurological: She is alert and oriented to person, place, and time. No cranial nerve deficit. She exhibits normal muscle tone. Coordination normal.  Skin: Skin is warm and dry. No rash noted. She is not diaphoretic. No erythema. No pallor.  Psychiatric: She has a normal mood and affect. Her behavior is normal. Judgment and thought content normal.          Assessment & Plan:   Problem List Items Addressed This Visit      Unprioritized   Essential hypertension, benign - Primary    BP Readings from Last 3 Encounters:  07/03/14 131/70  02/28/14 113/66  12/14/13 136/58   BP well controlled. Will request recent labs with renal function. Continue current medications.      Relevant Medications   amLODipine (NORVASC) 10 MG tablet   losartan (COZAAR) 100 MG tablet   propranolol ER (INDERAL LA) 80 MG 24 hr capsule   Overweight (BMI 25.0-29.9)    Wt Readings from Last 3 Encounters:  07/03/14 175 lb (79.379  kg)  02/28/14 172 lb 12 oz (78.359 kg)  12/14/13 166 lb 8 oz (75.524 kg)   Body mass index is 29.59 kg/(m^2). Encouraged healthy diet and exercise with goal of 75min 3x per week.      Psoriasis    Planning to start Methotrexate. Will request recent labs from Dr. Evorn Gong.      Squamous cell carcinoma in situ of skin of right lower leg    Scheduled for wider resection  with Dr. Evorn Gong. Will follow up          Return in about 6 months (around 01/03/2015) for Wellness Visit.

## 2014-07-03 NOTE — Patient Instructions (Addendum)
Follow up in 6 months. Consider reading "Always Hungry" by Isabella Stalling      Why follow it? Research shows. . Those who follow the Mediterranean diet have a reduced risk of heart disease  . The diet is associated with a reduced incidence of Parkinson's and Alzheimer's diseases . People following the diet may have longer life expectancies and lower rates of chronic diseases  . The Dietary Guidelines for Americans recommends the Mediterranean diet as an eating plan to promote health and prevent disease  What Is the Mediterranean Diet?  . Healthy eating plan based on typical foods and recipes of Mediterranean-style cooking . The diet is primarily a plant based diet; these foods should make up a majority of meals   Starches - Plant based foods should make up a majority of meals - They are an important sources of vitamins, minerals, energy, antioxidants, and fiber - Choose whole grains, foods high in fiber and minimally processed items  - Typical grain sources include wheat, oats, barley, corn, brown rice, bulgar, farro, millet, polenta, couscous  - Various types of beans include chickpeas, lentils, fava beans, black beans, white beans   Fruits  Veggies - Large quantities of antioxidant rich fruits & veggies; 6 or more servings  - Vegetables can be eaten raw or lightly drizzled with oil and cooked  - Vegetables common to the traditional Mediterranean Diet include: artichokes, arugula, beets, broccoli, brussel sprouts, cabbage, carrots, celery, collard greens, cucumbers, eggplant, kale, leeks, lemons, lettuce, mushrooms, okra, onions, peas, peppers, potatoes, pumpkin, radishes, rutabaga, shallots, spinach, sweet potatoes, turnips, zucchini - Fruits common to the Mediterranean Diet include: apples, apricots, avocados, cherries, clementines, dates, figs, grapefruits, grapes, melons, nectarines, oranges, peaches, pears, pomegranates, strawberries, tangerines  Fats - Replace butter and margarine with healthy  oils, such as olive oil, canola oil, and tahini  - Limit nuts to no more than a handful a day  - Nuts include walnuts, almonds, pecans, pistachios, pine nuts  - Limit or avoid candied, honey roasted or heavily salted nuts - Olives are central to the [employer] - can be eaten whole or used in a variety of dishes   Meats Protein - Limiting red meat: no more than a few times a month - When eating red meat: choose lean cuts and keep the portion to the size of deck of cards - Eggs: approx. 0 to 4 times a week  - Fish and lean poultry: at least 2 a week  - Healthy protein sources include, chicken, [place], lean beef, lamb - Increase intake of seafood such as tuna, salmon, trout, mackerel, shrimp, scallops - Avoid or limit high fat processed meats such as sausage and bacon  Dairy - Include moderate amounts of low fat dairy products  - Focus on healthy dairy such as fat free yogurt, skim milk, low or reduced fat cheese - Limit dairy products higher in fat such as whole or 2% milk, cheese, ice cream  Alcohol - Moderate amounts of red wine is ok  - No more than 5 oz daily for women (all ages) and men older than age 39  - No more than 10 oz of wine daily for men younger than 94  Other - Limit sweets and other desserts  - Use herbs and spices instead of salt to flavor foods  - Herbs and spices common to the traditional Mediterranean Diet include: basil, bay leaves, chives, cloves, cumin, fennel, garlic, lavender, marjoram, mint, oregano, parsley, pepper, rosemary, sage, savory, sumac, tarragon, thyme  It's not just a diet, it's a lifestyle:  . The Mediterranean diet includes lifestyle factors typical of those in the region  . Foods, drinks and meals are best eaten with others and savored . Daily physical activity is important for overall good health . This could be strenuous exercise like running and aerobics . This could also be more leisurely activities such as walking, housework, yard-work,  or taking the stairs . Moderation is the key; a balanced and healthy diet accommodates most foods and drinks . Consider portion sizes and frequency of consumption of certain foods   Meal Ideas & Options:  . Breakfast:  o Whole wheat toast or whole wheat English muffins with peanut butter & hard boiled egg o Steel cut oats topped with apples & cinnamon and skim milk  o Fresh fruit: banana, strawberries, melon, berries, peaches  o Smoothies: strawberries, bananas, greek yogurt, peanut butter o Low fat greek yogurt with blueberries and granola  o Egg white omelet with spinach and mushrooms o Breakfast couscous: whole wheat couscous, apricots, skim milk, cranberries  . Sandwiches:  o Hummus and grilled vegetables (peppers, zucchini, squash) on whole wheat bread   o Grilled chicken on whole wheat pita with lettuce, tomatoes, cucumbers or tzatziki  o Tuna salad on whole wheat bread: tuna salad made with greek yogurt, olives, red peppers, capers, green onions o Garlic rosemary lamb pita: lamb sauted with garlic, rosemary, salt & pepper; add lettuce, cucumber, greek yogurt to pita - flavor with lemon juice and black pepper  . Seafood:  o Mediterranean grilled salmon, seasoned with garlic, basil, parsley, lemon juice and black pepper o Shrimp, lemon, and spinach whole-grain pasta salad made with low fat greek yogurt  o Seared scallops with lemon orzo  o Seared tuna steaks seasoned salt, pepper, coriander topped with tomato mixture of olives, tomatoes, olive oil, minced garlic, parsley, green onions and cappers  . Meats:  o Herbed greek chicken salad with kalamata olives, cucumber, feta  o Red bell peppers stuffed with spinach, bulgur, lean ground beef (or lentils) & topped with feta   o Kebabs: skewers of chicken, tomatoes, onions, zucchini, squash  o [place] burgers: made with red onions, mint, dill, lemon juice, feta cheese topped with roasted red peppers . Vegetarian o Cucumber salad:  cucumbers, artichoke hearts, celery, red onion, feta cheese, tossed in olive oil & lemon juice  o Hummus and whole grain pita points with a greek salad (lettuce, tomato, feta, olives, cucumbers, red onion) o Lentil soup with celery, carrots made with vegetable broth, garlic, salt and pepper  o Tabouli salad: parsley, bulgur, mint, scallions, cucumbers, tomato, radishes, lemon juice, olive oil, salt and pepper.

## 2014-07-03 NOTE — Assessment & Plan Note (Signed)
Scheduled for wider resection with Dr. Evorn Gong. Will follow up

## 2014-07-11 DIAGNOSIS — E119 Type 2 diabetes mellitus without complications: Secondary | ICD-10-CM | POA: Diagnosis not present

## 2014-07-12 LAB — HM DIABETES EYE EXAM

## 2014-07-13 ENCOUNTER — Encounter: Payer: Self-pay | Admitting: *Deleted

## 2014-07-20 DIAGNOSIS — C44722 Squamous cell carcinoma of skin of right lower limb, including hip: Secondary | ICD-10-CM | POA: Diagnosis not present

## 2014-07-28 DIAGNOSIS — Z5181 Encounter for therapeutic drug level monitoring: Secondary | ICD-10-CM | POA: Diagnosis not present

## 2014-07-28 DIAGNOSIS — L400 Psoriasis vulgaris: Secondary | ICD-10-CM | POA: Diagnosis not present

## 2014-07-31 ENCOUNTER — Other Ambulatory Visit: Payer: Self-pay | Admitting: Internal Medicine

## 2014-08-03 ENCOUNTER — Other Ambulatory Visit: Payer: Self-pay | Admitting: Internal Medicine

## 2014-08-07 DIAGNOSIS — S0006XA Insect bite (nonvenomous) of scalp, initial encounter: Secondary | ICD-10-CM | POA: Diagnosis not present

## 2014-10-09 ENCOUNTER — Other Ambulatory Visit: Payer: Self-pay | Admitting: Internal Medicine

## 2014-10-23 DIAGNOSIS — L720 Epidermal cyst: Secondary | ICD-10-CM | POA: Diagnosis not present

## 2014-10-23 DIAGNOSIS — L814 Other melanin hyperpigmentation: Secondary | ICD-10-CM | POA: Diagnosis not present

## 2014-10-23 DIAGNOSIS — Z5181 Encounter for therapeutic drug level monitoring: Secondary | ICD-10-CM | POA: Diagnosis not present

## 2014-10-23 DIAGNOSIS — L821 Other seborrheic keratosis: Secondary | ICD-10-CM | POA: Diagnosis not present

## 2014-10-23 DIAGNOSIS — L400 Psoriasis vulgaris: Secondary | ICD-10-CM | POA: Diagnosis not present

## 2014-10-23 DIAGNOSIS — D2261 Melanocytic nevi of right upper limb, including shoulder: Secondary | ICD-10-CM | POA: Diagnosis not present

## 2014-10-23 DIAGNOSIS — D485 Neoplasm of uncertain behavior of skin: Secondary | ICD-10-CM | POA: Diagnosis not present

## 2014-12-16 ENCOUNTER — Other Ambulatory Visit: Payer: Self-pay | Admitting: Internal Medicine

## 2015-01-03 ENCOUNTER — Ambulatory Visit (INDEPENDENT_AMBULATORY_CARE_PROVIDER_SITE_OTHER): Payer: Medicare Other

## 2015-01-03 VITALS — HR 60 | Temp 97.1°F | Resp 12 | Ht 64.0 in | Wt 164.8 lb

## 2015-01-03 DIAGNOSIS — Z1159 Encounter for screening for other viral diseases: Secondary | ICD-10-CM

## 2015-01-03 DIAGNOSIS — Z0000 Encounter for general adult medical examination without abnormal findings: Secondary | ICD-10-CM | POA: Diagnosis not present

## 2015-01-03 DIAGNOSIS — Z13220 Encounter for screening for lipoid disorders: Secondary | ICD-10-CM

## 2015-01-03 DIAGNOSIS — R739 Hyperglycemia, unspecified: Secondary | ICD-10-CM

## 2015-01-03 DIAGNOSIS — R7309 Other abnormal glucose: Secondary | ICD-10-CM

## 2015-01-03 DIAGNOSIS — Z13820 Encounter for screening for osteoporosis: Secondary | ICD-10-CM

## 2015-01-03 DIAGNOSIS — Z1231 Encounter for screening mammogram for malignant neoplasm of breast: Secondary | ICD-10-CM

## 2015-01-03 DIAGNOSIS — Z23 Encounter for immunization: Secondary | ICD-10-CM | POA: Diagnosis not present

## 2015-01-03 LAB — LIPID PANEL
Cholesterol: 204 mg/dL — ABNORMAL HIGH (ref 0–200)
HDL: 63.4 mg/dL (ref >39.00)
LDL Cholesterol: 120 mg/dL — ABNORMAL HIGH (ref 0–99)
NONHDL: 140.43
TRIGLYCERIDES: 101 mg/dL (ref 0.0–149.0)
Total CHOL/HDL Ratio: 3
VLDL: 20.2 mg/dL (ref 0.0–40.0)

## 2015-01-03 LAB — MICROALBUMIN / CREATININE URINE RATIO
CREATININE, U: 351.7 mg/dL
MICROALB/CREAT RATIO: 0.9 mg/g (ref 0.0–30.0)
Microalb, Ur: 3.3 mg/dL — ABNORMAL HIGH (ref 0.0–1.9)

## 2015-01-03 LAB — HEMOGLOBIN A1C: HEMOGLOBIN A1C: 5.8 % (ref 4.6–6.5)

## 2015-01-03 NOTE — Progress Notes (Signed)
Annual Wellness Visit as completed by Health Coach was reviewed in full.  

## 2015-01-03 NOTE — Progress Notes (Signed)
Subjective:   Molly Gregory is a 64 y.o. female who presents for an Initial Medicare Annual Wellness Visit.  Review of Systems    No ROS.  Medicare Wellness Visit.   Cardiac Risk Factors include: advanced age (>60men, >36 women)     Objective:    Today's Vitals   01/03/15 0812  Pulse: 60  Temp: 97.1 F (36.2 C)  TempSrc: Oral  Resp: 12  Height: 5\' 4"  (1.626 m)  Weight: 164 lb 12.8 oz (74.753 kg)  SpO2: 98%    Current Medications (verified) Outpatient Encounter Prescriptions as of 01/03/2015  Medication Sig  . amLODipine (NORVASC) 10 MG tablet Take 1 tablet (10 mg total) by mouth daily.  Marland Kitchen amLODipine (NORVASC) 10 MG tablet TAKE 1 TABLET (10 MG TOTAL) BY MOUTH DAILY.  . Calcium Carbonate-Vitamin D (CALCIUM + D PO) Take by mouth daily.  . citalopram (CELEXA) 10 MG tablet Take 1 tablet (10 mg total) by mouth daily.  Marland Kitchen losartan (COZAAR) 100 MG tablet Take 1 tablet (100 mg total) by mouth daily.  . Multiple Vitamins-Minerals (WOMENS MULTIVITAMIN PLUS PO) Take by mouth.  . propranolol ER (INDERAL LA) 80 MG 24 hr capsule Take 1 capsule (80 mg total) by mouth daily.  . propranolol ER (INDERAL LA) 80 MG 24 hr capsule TAKE ONE CAPSULE BY MOUTH EVERY DAY  . psyllium (METAMUCIL) 58.6 % packet Take 1 packet by mouth daily.  Marland Kitchen triamcinolone cream (KENALOG) 0.1 % Apply 1 application topically 2 (two) times daily.  . [DISCONTINUED] Dextromethorphan-Guaifenesin (MUCINEX DM MAXIMUM STRENGTH) 60-1200 MG TB12 Take by mouth.  Marland Kitchen albuterol (PROVENTIL HFA;VENTOLIN HFA) 108 (90 BASE) MCG/ACT inhaler Inhale 2 puffs into the lungs every 6 (six) hours as needed for wheezing or shortness of breath. (Patient not taking: Reported on 01/03/2015)  . Ibuprofen 200 MG CAPS Take by mouth.   No facility-administered encounter medications on file as of 01/03/2015.    Allergies (verified) Penicillins   History: Past Medical History  Diagnosis Date  . Arthritis   . Diabetes mellitus without  complication ([place])   . Allergy   . Hyperlipidemia   . Hypertension   . H/O psoriasis   . History of chicken pox   . Hx of acute bronchitis   . Hx of migraines   . Head injury, closed 2013  . H/O exercise stress test     normal   Past Surgical History  Procedure Laterality Date  . Tubal ligation    . Carpal tunnel release Bilateral     both hands, Dr. Ranae Pila  . Eye surgery Left     cornea transplant, Dr. Sandra Cockayne   Family History  Problem Relation Age of Onset  . Hypertension Maternal Aunt   . Aneurysm Maternal Aunt   . Diabetes Maternal Grandmother   . Heart disease Maternal Grandmother   . Hypertension Maternal Grandmother   . Diabetes Paternal Grandmother   . Heart disease Paternal Grandmother   . Hypertension Paternal Grandmother    Social History   Occupational History  . Not on file.   Social History Main Topics  . Smoking status: Never Smoker   . Smokeless tobacco: Not on file  . Alcohol Use: No  . Drug Use: No  . Sexual Activity: Not Currently    Tobacco Counseling Counseling given: Not Answered   Activities of Daily Living In your present state of health, do you have any difficulty performing the following activities: 01/03/2015  Hearing? N  Vision? N  Difficulty concentrating or making decisions? N  Walking or climbing stairs? N  Dressing or bathing? N  Doing errands, shopping? N  Preparing Food and eating ? N  Using the Toilet? N  In the past six months, have you accidently leaked urine? N  Do you have problems with loss of bowel control? N  Managing your Medications? N  Managing your Finances? N  Housekeeping or managing your Housekeeping? N    Immunizations and Health Maintenance Immunization History  Administered Date(s) Administered  . Influenza, High Dose Seasonal PF 01/03/2015  . Influenza,inj,Quad PF,36+ Mos 12/16/2012, 11/26/2013  . Pneumococcal Conjugate-13 12/14/2013  . Pneumococcal Polysaccharide-23 12/17/2011  . Tdap  12/17/2011  . Zoster 12/17/2011   Health Maintenance Due  Topic Date Due  . FOOT EXAM  10/13/1958  . COLONOSCOPY  12/16/2012  . DEXA SCAN  10/12/2013  . MAMMOGRAM  01/03/2015    Patient Care Team: Jackolyn Confer, MD as PCP - General (Internal Medicine)  Indicate any recent Medical Services you may have received from other than [facility] providers in the past year (date may be approximate).     Assessment:   This is a routine wellness examination for Molly Gregory.  The goal of the wellness visit is to assist the patient how to close the gaps in care and create a preventative care plan for the patient.   Calcium and Vit D as appropriate/ Osteoporosis risk reviewed.  Taking meds without issues; no barriers identified.  Safety issues reviewed; smoke detectors in the home. Firearms locked in a secure area. Wears seatbelts when driving or riding with others. No violence in the home.  No identified risk were noted; The patient was oriented x 3; appropriate in dress and manner and no objective failures at ADL's or IADL's.   Labs completed today: Microalbumin Urine with creat. ratio, CMP, Lipid panel, HgB A1C  High dose influenza administered.  Tolerated well.  Referrals placed today: Bone Density, Mammogram.  Patient Concerns: Uncomfortable feeling, some numbness in both feet.  Deferred to PCP.    Colonoscopy declined.  COLOGUARD option completed 1 year ago.  Hearing/Vision screen  Hearing Screening   125Hz  250Hz  500Hz  1000Hz  2000Hz  4000Hz  8000Hz   Right ear:   Pass Pass Pass Pass   Left ear:   Pass Pass Pass Pass   Vision Screening Comments: Followed by [facility], Dr. Sandra Cockayne Hx of Cornea Dystrophy Wears glasses  Dietary issues and exercise activities discussed: Current Exercise Habits:: Home exercise routine (Works out with DVD 3x weekly for 30 minutes), Type of exercise: walking, Time (Minutes): 60, Intensity: Moderate  Goals    . Eat more fruits and  vegetables     Incorporate more fresh fruits and vegetable in diet.  Continue making healthier food choices and drinking plenty of water.         Depression Screen PHQ 2/9 Scores 01/03/2015 12/14/2013  PHQ - 2 Score 0 0    Fall Risk Fall Risk  01/03/2015 12/14/2013  Falls in the past year? No No    Cognitive Function: MMSE - Mini Mental State Exam 01/03/2015  Orientation to time 5  Orientation to Place 5  Registration 3  Attention/ Calculation 5  Recall 3  Language- name 2 objects 2  Language- repeat 1  Language- follow 3 step command 3  Language- read & follow direction 1  Write a sentence 1  Copy design 1  Total score 30    Screening Tests Health Maintenance  Topic Date Due  .  FOOT EXAM  10/13/1958  . COLONOSCOPY  12/16/2012  . DEXA SCAN  10/12/2013  . MAMMOGRAM  01/03/2015  . Hepatitis C Screening  01/03/2016 (Originally Feb 21, 1951)  . HEMOGLOBIN A1C  07/03/2015  . OPHTHALMOLOGY EXAM  07/12/2015  . INFLUENZA VACCINE  10/02/2015  . URINE MICROALBUMIN  01/03/2016  . PNA vac Low Risk Adult (2 of 2 - PPSV23) 12/16/2016  . TETANUS/TDAP  12/16/2021  . ZOSTAVAX  Completed      Plan:   End of life planning was discussed; aging in home or other; plans to return copy of HCPOA/Living Will.   Follow up in 2 weeks with PCP.  Follow up with [facility] in 1 year with Health Coach.  Follow up with Mammogram as directed.  Follow up with Dexa Scan as directed.   During the course of the visit, Molly Gregory was educated and counseled about the following appropriate screening and preventive services:   Vaccines to include Pneumoccal, Influenza, Hepatitis B, Td, Zostavax, HCV  Electrocardiogram  Cardiovascular disease screening  Colorectal cancer screening  Bone density screening  Diabetes screening  Glaucoma screening  Mammography/PAP  Nutrition counseling  Smoking cessation counseling  Patient Instructions (the written plan) were given to the patient.     Varney Biles, LPN   [id number]

## 2015-01-03 NOTE — Patient Instructions (Signed)
Bone Densitometry Bone densitometry is an imaging test that uses a special X-ray to measure the amount of calcium and other minerals in your bones (bone density). This test is also known as a bone mineral density test or dual-energy X-ray absorptiometry (DXA). The test can measure bone density at your hip and your spine. It is similar to having a regular X-ray. You may have this test to:  Diagnose a condition that causes weak or thin bones (osteoporosis).  Predict your risk of a broken bone (fracture).  Determine how well osteoporosis treatment is working. LET [facility] CARE PROVIDER KNOW ABOUT:  Any allergies you have.  All medicines you are taking, including vitamins, herbs, eye drops, creams, and over-the-counter medicines.  Previous problems you or members of your family have had with the use of anesthetics.  Any blood disorders you have.  Previous surgeries you have had.  Medical conditions you have.  Possibility of pregnancy.  Any other medical test you had within the previous 14 days that used contrast material. RISKS AND COMPLICATIONS Generally, this is a safe procedure. However, problems can occur and may include the following:  This test exposes you to a very small amount of radiation.  The risks of radiation exposure may be greater to unborn children. BEFORE THE PROCEDURE  Do not take any calcium supplements for 24 hours before having the test. You can otherwise eat and drink what you usually do.  Take off all metal jewelry, eyeglasses, dental appliances, and any other metal objects. PROCEDURE  You may lie on an exam table. There will be an X-ray generator below you and an imaging device above you.  Other devices, such as boxes or braces, may be used to position your body properly for the scan.  You will need to lie still while the machine slowly scans your body.  The images will show up on a computer monitor. AFTER THE PROCEDURE You may need more testing  at a later time.   This information is not intended to replace advice given to you by your health care provider. Make sure you discuss any questions you have with your health care provider.   Document Released: 03/11/2004 Document Revised: 03/10/2014 Document Reviewed: 07/28/2013 Elsevier Interactive Patient Education 2016 [place] A mammogram is an X-ray of the breasts that is done to check for abnormal changes. This procedure can screen for and detect any changes that may suggest breast cancer. A mammogram can also identify other changes and variations in the breast, such as:  Inflammation of the breast tissue (mastitis).  An infected area that contains a collection of pus (abscess).  A fluid-filled sac (cyst).  Fibrocystic changes. This is when breast tissue becomes denser, which can make the tissue feel rope-like or uneven under the skin.  Tumors that are not cancerous (benign). LET [facility] CARE PROVIDER KNOW ABOUT:  Any allergies you have.  If you have breast implants.  If you have had previous breast disease, biopsy, or surgery.  If you are breastfeeding.  Any possibility that you could be pregnant, if this applies.  If you are younger than age 38.  If you have a family history of breast cancer. RISKS AND COMPLICATIONS Generally, this is a safe procedure. However, problems may occur, including:  Exposure to radiation. Radiation levels are very low with this test.  The results being misinterpreted.  The need for further tests.  The inability of the mammogram to detect certain cancers. BEFORE THE PROCEDURE  Schedule your test about 1-2 weeks after your menstrual period. This is usually when your breasts are the least tender.  If you have had a mammogram done at a different facility in the past, get the mammogram X-rays or have them sent to your current exam facility in order to compare them.  Wash your breasts and under your arms the day of  the test.  Do not wear deodorants, perfumes, lotions, or powders anywhere on your body on the day of the test.  Remove any jewelry from your neck.  Wear clothes that you can change into and out of easily. PROCEDURE  You will undress from the waist up and put on a gown.  You will stand in front of the X-ray machine.  Each breast will be placed between two plastic or glass plates. The plates will compress your breast for a few seconds. Try to stay as relaxed as possible during the procedure. This does not cause any harm to your breasts and any discomfort you feel will be very brief.  X-rays will be taken from different angles of each breast. The procedure may vary among health care providers and hospitals. AFTER THE PROCEDURE  The mammogram will be examined by a specialist (radiologist).  You may need to repeat certain parts of the test, depending on the quality of the images. This is commonly done if the radiologist needs a better view of the breast tissue.  Ask when your test results will be ready. Make sure you get your test results.  You may resume your normal activities.   This information is not intended to replace advice given to you by your health care provider. Make sure you discuss any questions you have with your health care provider.   Document Released: 02/15/2000 Document Revised: 11/08/2014 Document Reviewed: 04/28/2014 Elsevier Interactive Patient Education [employer].

## 2015-01-09 DIAGNOSIS — E119 Type 2 diabetes mellitus without complications: Secondary | ICD-10-CM | POA: Diagnosis not present

## 2015-01-15 ENCOUNTER — Ambulatory Visit (INDEPENDENT_AMBULATORY_CARE_PROVIDER_SITE_OTHER): Payer: Medicare Other | Admitting: Internal Medicine

## 2015-01-15 ENCOUNTER — Encounter: Payer: Self-pay | Admitting: Internal Medicine

## 2015-01-15 VITALS — BP 156/71 | HR 51 | Temp 98.0°F | Ht 64.5 in | Wt 165.2 lb

## 2015-01-15 DIAGNOSIS — E663 Overweight: Secondary | ICD-10-CM

## 2015-01-15 DIAGNOSIS — G609 Hereditary and idiopathic neuropathy, unspecified: Secondary | ICD-10-CM | POA: Diagnosis not present

## 2015-01-15 DIAGNOSIS — I10 Essential (primary) hypertension: Secondary | ICD-10-CM | POA: Diagnosis not present

## 2015-01-15 NOTE — Assessment & Plan Note (Signed)
BP Readings from Last 3 Encounters:  01/15/15 156/71  07/03/14 131/70  02/28/14 113/66   BP elevated slightly today but generally well controlled. Will repeat check in 4 weeks. Continue current medications.

## 2015-01-15 NOTE — Progress Notes (Signed)
Pre visit review using our clinic review tool, if applicable. No additional management support is needed unless otherwise documented below in the visit note. 

## 2015-01-15 NOTE — Assessment & Plan Note (Signed)
Symptoms are most consistent with neuropathy. Recent A1c was normal. Previous B12 was normal. Will check urine heavy metals as she drinks water from a well. Will set up neurology evaluation for possible EMG testing. Consider adding Neurontin at next visit if symptoms persistent.

## 2015-01-15 NOTE — Patient Instructions (Signed)
Lab test of urine for heavy metals.  We will set up referral to neurology for EMG testing.  We will set up evaluation with podiatry for evaluation for orthotics.

## 2015-01-15 NOTE — Assessment & Plan Note (Signed)
Congratulated pt on weight loss. Encouraged continued healthy diet and exercise. 

## 2015-01-15 NOTE — Progress Notes (Signed)
Subjective:    Patient ID: Molly Gregory, female    DOB: 1950-10-15, 64 y.o.   MRN: [id number]  HPI  64YO female presents for follow up.  Last seen in 07/2014.  HTN - Has not taken medication this morning. No recent CP, HA, palpitations. Recent renal function normal.  Foot  Pain - Ongoing for months. Bilateral. Like numb pressure in distal feet bilaterally. Using a cream at night which helps with pain.  Weight - Walking 8 laps at home daily. Has lost about 10lb.   Wt Readings from Last 3 Encounters:  01/15/15 165 lb 4 oz (74.957 kg)  01/03/15 164 lb 12.8 oz (74.753 kg)  07/03/14 175 lb (79.379 kg)   BP Readings from Last 3 Encounters:  01/15/15 156/71  07/03/14 131/70  02/28/14 113/66    Past Medical History  Diagnosis Date  . Arthritis   . Diabetes mellitus without complication ([place])   . Allergy   . Hyperlipidemia   . Hypertension   . H/O psoriasis   . History of chicken pox   . Hx of acute bronchitis   . Hx of migraines   . Head injury, closed 2013  . H/O exercise stress test     normal   Family History  Problem Relation Age of Onset  . Hypertension Maternal Aunt   . Aneurysm Maternal Aunt   . Diabetes Maternal Grandmother   . Heart disease Maternal Grandmother   . Hypertension Maternal Grandmother   . Diabetes Paternal Grandmother   . Heart disease Paternal Grandmother   . Hypertension Paternal Grandmother    Past Surgical History  Procedure Laterality Date  . Tubal ligation    . Carpal tunnel release Bilateral     both hands, Dr. Ranae Pila  . Eye surgery Left     cornea transplant, Dr. Sandra Cockayne   Social History   Social History  . Marital Status: Married    Spouse Name: N/A  . Number of Children: N/A  . Years of Education: N/A   Social History Main Topics  . Smoking status: Never Smoker   . Smokeless tobacco: None  . Alcohol Use: No  . Drug Use: No  . Sexual Activity: Not Currently   Other Topics Concern  . None   Social History  Narrative   Lives with husband in [place]. Cat in home. 1 daughter and 2 sons. 7 grandchildren.      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  Eyes: Negative for visual disturbance.  Respiratory: Negative for shortness of breath.   Cardiovascular: Negative for chest pain, palpitations and leg swelling.  Gastrointestinal: Negative for nausea, vomiting, abdominal pain, diarrhea and constipation.  Musculoskeletal: Positive for myalgias and arthralgias.  Skin: Negative for color change and rash.  Neurological: Positive for numbness. Negative for weakness.  Hematological: Negative for adenopathy. Does not bruise/bleed easily.  Psychiatric/Behavioral: Negative for dysphoric mood. The patient is not nervous/anxious.        Objective:    BP 156/71 mmHg  Pulse 51  Temp(Src) 98 F (36.7 C) (Oral)  Ht 5' 4.5" (1.638 m)  Wt 165 lb 4 oz (74.957 kg)  BMI 27.94 kg/m2  SpO2 100% Physical Exam  Constitutional: She is oriented to person, place, and time. She appears well-developed and well-nourished.  No distress.  HENT:  Head: Normocephalic and atraumatic.  Right Ear: External ear normal.  Left Ear: External ear normal.  Nose: Nose normal.  Mouth/Throat: Oropharynx is clear and moist. No oropharyngeal exudate.  Eyes: Conjunctivae are normal. Pupils are equal, round, and reactive to light. Right eye exhibits no discharge. Left eye exhibits no discharge. No scleral icterus.  Neck: Normal range of motion. Neck supple. No tracheal deviation present. No thyromegaly present.  Cardiovascular: Normal rate, regular rhythm, normal heart sounds and intact distal pulses.  Exam reveals no gallop and no friction rub.   No murmur heard. Pulmonary/Chest: Effort normal and breath sounds normal. No respiratory distress. She has no wheezes. She  has no rales. She exhibits no tenderness.  Musculoskeletal: Normal range of motion. She exhibits no edema or tenderness.       Feet:  Sensation to light touch intact over distal feet.  Lymphadenopathy:    She has no cervical adenopathy.  Neurological: She is alert and oriented to person, place, and time. No cranial nerve deficit. She exhibits normal muscle tone. Coordination normal.  Skin: Skin is warm and dry. No rash noted. She is not diaphoretic. No erythema. No pallor.  Psychiatric: She has a normal mood and affect. Her behavior is normal. Judgment and thought content normal.          Assessment & Plan:   Problem List Items Addressed This Visit      Unprioritized   Essential hypertension, benign - Primary    BP Readings from Last 3 Encounters:  01/15/15 156/71  07/03/14 131/70  02/28/14 113/66   BP elevated slightly today but generally well controlled. Will repeat check in 4 weeks. Continue current medications.      Hereditary and idiopathic peripheral neuropathy    Symptoms are most consistent with neuropathy. Recent A1c was normal. Previous B12 was normal. Will check urine heavy metals as she drinks water from a well. Will set up neurology evaluation for possible EMG testing. Consider adding Neurontin at next visit if symptoms persistent.      Relevant Orders   Ambulatory referral to Podiatry   Ambulatory referral to Neurology   Heavy Metals Profile, Urine   Overweight (BMI 25.0-29.9)    Congratulated pt on weight loss. Encouraged continued healthy diet and exercise.          Return in about 4 weeks (around 02/12/2015) for Recheck.

## 2015-01-17 DIAGNOSIS — G609 Hereditary and idiopathic neuropathy, unspecified: Secondary | ICD-10-CM | POA: Diagnosis not present

## 2015-01-17 DIAGNOSIS — M2042 Other hammer toe(s) (acquired), left foot: Secondary | ICD-10-CM | POA: Diagnosis not present

## 2015-01-17 DIAGNOSIS — M2041 Other hammer toe(s) (acquired), right foot: Secondary | ICD-10-CM | POA: Diagnosis not present

## 2015-01-19 LAB — HEAVY METALS PROFILE, URINE
ARSENIC UR: NOT DETECTED ug/L (ref 0–50)
Arsenic(Inorganic),U: NOT DETECTED ug/L (ref 0–19)
CREATININE(CRT), U: 0.51 g/L (ref 0.30–3.00)
Lead, Rand Ur: NOT DETECTED ug/L (ref 0–49)
MERCURY UR: NOT DETECTED ug/L (ref 0–19)

## 2015-02-05 DIAGNOSIS — M8588 Other specified disorders of bone density and structure, other site: Secondary | ICD-10-CM | POA: Diagnosis not present

## 2015-02-05 DIAGNOSIS — Z13820 Encounter for screening for osteoporosis: Secondary | ICD-10-CM | POA: Diagnosis not present

## 2015-02-05 DIAGNOSIS — M85862 Other specified disorders of bone density and structure, left lower leg: Secondary | ICD-10-CM | POA: Diagnosis not present

## 2015-02-05 DIAGNOSIS — M85852 Other specified disorders of bone density and structure, left thigh: Secondary | ICD-10-CM | POA: Diagnosis not present

## 2015-02-05 DIAGNOSIS — Z1231 Encounter for screening mammogram for malignant neoplasm of breast: Secondary | ICD-10-CM | POA: Diagnosis not present

## 2015-02-05 LAB — HM DEXA SCAN

## 2015-02-05 LAB — HM MAMMOGRAPHY

## 2015-02-15 ENCOUNTER — Ambulatory Visit: Payer: Medicare Other | Admitting: Internal Medicine

## 2015-02-15 DIAGNOSIS — G629 Polyneuropathy, unspecified: Secondary | ICD-10-CM | POA: Diagnosis not present

## 2015-02-16 ENCOUNTER — Ambulatory Visit (INDEPENDENT_AMBULATORY_CARE_PROVIDER_SITE_OTHER): Payer: Medicare Other | Admitting: Internal Medicine

## 2015-02-16 ENCOUNTER — Encounter: Payer: Self-pay | Admitting: Internal Medicine

## 2015-02-16 VITALS — BP 130/70 | HR 56 | Temp 98.1°F | Resp 18 | Ht 64.5 in | Wt 163.0 lb

## 2015-02-16 DIAGNOSIS — E663 Overweight: Secondary | ICD-10-CM

## 2015-02-16 DIAGNOSIS — J209 Acute bronchitis, unspecified: Secondary | ICD-10-CM

## 2015-02-16 DIAGNOSIS — G609 Hereditary and idiopathic neuropathy, unspecified: Secondary | ICD-10-CM

## 2015-02-16 DIAGNOSIS — I10 Essential (primary) hypertension: Secondary | ICD-10-CM

## 2015-02-16 MED ORDER — ALBUTEROL SULFATE HFA 108 (90 BASE) MCG/ACT IN AERS: 2.0000 | INHALATION_SPRAY | Freq: Four times a day (QID) | RESPIRATORY_TRACT | Status: DC | PRN

## 2015-02-16 NOTE — Progress Notes (Signed)
Subjective:    Patient ID: Molly Gregory, female    DOB: April 06, 1950, 64 y.o.   MRN: [id number]  HPI  64YO female presents for follow up.  Last seen in November for peripheral neuropathy and hypertension. Referred to Podiatry and Neurology.  Seen by Dr. Manuella Ghazi in Neurology. He recommended Neurontin. She has not started as concerned about side effects. Notes several family members with similar symptoms. She continues to have pain in legs, particularly at night, which wakes her from sleep. No weakness or numbness noted.  She is working on [employer] and exercise with goal of preventing DM. Her goal weight is 149.  HTN - BP well controlled. No CP, HA, palpitations. Compliant with medication.   Wt Readings from Last 3 Encounters:  02/16/15 163 lb (73.936 kg)  01/15/15 165 lb 4 oz (74.957 kg)  01/03/15 164 lb 12.8 oz (74.753 kg)   BP Readings from Last 3 Encounters:  02/16/15 130/70  01/15/15 156/71  07/03/14 131/70    Past Medical History  Diagnosis Date  . Arthritis   . Diabetes mellitus without complication ([place])   . Allergy   . Hyperlipidemia   . Hypertension   . H/O psoriasis   . History of chicken pox   . Hx of acute bronchitis   . Hx of migraines   . Head injury, closed 2013  . H/O exercise stress test     normal   Family History  Problem Relation Age of Onset  . Hypertension Maternal Aunt   . Aneurysm Maternal Aunt   . Diabetes Maternal Grandmother   . Heart disease Maternal Grandmother   . Hypertension Maternal Grandmother   . Diabetes Paternal Grandmother   . Heart disease Paternal Grandmother   . Hypertension Paternal Grandmother    Past Surgical History  Procedure Laterality Date  . Tubal ligation    . Carpal tunnel release Bilateral     both hands, Dr. Ranae Pila  . Eye surgery Left     cornea transplant, Dr. Sandra Cockayne   Social History   Social History  . Marital Status: Married    Spouse Name: N/A  . Number of Children: N/A  . Years of  Education: N/A   Social History Main Topics  . Smoking status: Never Smoker   . Smokeless tobacco: None  . Alcohol Use: No  . Drug Use: No  . Sexual Activity: Not Currently   Other Topics Concern  . None   Social History Narrative   Lives with husband in [place]. Cat in home. 1 daughter and 2 sons. 7 grandchildren.      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  HENT: Positive for congestion, postnasal drip and rhinorrhea. Negative for sinus pressure.   Eyes: Negative for visual disturbance.  Respiratory: Negative for cough and shortness of breath.   Cardiovascular: Negative for chest pain and leg swelling.  Gastrointestinal: Negative for nausea, vomiting, abdominal pain, diarrhea and constipation.  Musculoskeletal: Positive for myalgias. Negative for arthralgias.  Skin: Negative for color change and rash.  Neurological: Negative for weakness and numbness.  Hematological: Negative for adenopathy. Does not bruise/bleed easily.  Psychiatric/Behavioral: Positive for sleep disturbance. Negative for dysphoric mood. The patient is not nervous/anxious.        Objective:  BP 130/70 mmHg  Pulse 56  Temp(Src) 98.1 F (36.7 C) (Oral)  Resp 18  Ht 5' 4.5" (1.638 m)  Wt 163 lb (73.936 kg)  BMI 27.56 kg/m2  SpO2 97% Physical Exam  Constitutional: She is oriented to person, place, and time. She appears well-developed and well-nourished. No distress.  HENT:  Head: Normocephalic and atraumatic.  Right Ear: External ear normal.  Left Ear: External ear normal.  Nose: Nose normal.  Mouth/Throat: Oropharynx is clear and moist. No oropharyngeal exudate.  Eyes: Conjunctivae are normal. Pupils are equal, round, and reactive to light. Right eye exhibits no discharge. Left eye exhibits no discharge. No scleral icterus.    Neck: Normal range of motion. Neck supple. No tracheal deviation present. No thyromegaly present.  Cardiovascular: Normal rate, regular rhythm, normal heart sounds and intact distal pulses.  Exam reveals no gallop and no friction rub.   No murmur heard. Pulmonary/Chest: Effort normal and breath sounds normal. No respiratory distress. She has no wheezes. She has no rales. She exhibits no tenderness.  Musculoskeletal: Normal range of motion. She exhibits no edema or tenderness.  Lymphadenopathy:    She has no cervical adenopathy.  Neurological: She is alert and oriented to person, place, and time. No cranial nerve deficit. She exhibits normal muscle tone. Coordination normal.  Skin: Skin is warm and dry. No rash noted. She is not diaphoretic. No erythema. No pallor.  Psychiatric: She has a normal mood and affect. Her behavior is normal. Judgment and thought content normal.          Assessment & Plan:   Problem List Items Addressed This Visit      Unprioritized   Essential hypertension, benign - Primary    BP Readings from Last 3 Encounters:  02/16/15 130/70  01/15/15 156/71  07/03/14 131/70   BP well controlled. Continue current medications. Follow up in 6 months and prn.      Hereditary and idiopathic peripheral neuropathy    No change in symptoms. Reviewed notes from Neurology. Recommended adding Neurontin 300mg  at bedtime. Follow up with neurology in 04/2015 and here  In 6 months and prn.      Relevant Medications   gabapentin (NEURONTIN) 300 MG capsule   Overweight (BMI 25.0-29.9)    Wt Readings from Last 3 Encounters:  02/16/15 163 lb (73.936 kg)  01/15/15 165 lb 4 oz (74.957 kg)  01/03/15 164 lb 12.8 oz (74.753 kg)   Body mass index is 27.56 kg/(m^2). Congratulated pt on weight loss. Encouraged continued healthy diet and exercise.       Other Visit Diagnoses    Acute bronchitis, unspecified organism        Relevant Medications    albuterol (PROVENTIL  HFA;VENTOLIN HFA) 108 (90 BASE) MCG/ACT inhaler        Return in about 6 months (around 08/17/2015) for Recheck.

## 2015-02-16 NOTE — Patient Instructions (Addendum)
Consider starting Neurontin 300mg  at bedtime to help with leg pain.  Follow up in 6 months.

## 2015-02-16 NOTE — Assessment & Plan Note (Signed)
Wt Readings from Last 3 Encounters:  02/16/15 163 lb (73.936 kg)  01/15/15 165 lb 4 oz (74.957 kg)  01/03/15 164 lb 12.8 oz (74.753 kg)   Body mass index is 27.56 kg/(m^2). Congratulated pt on weight loss. Encouraged continued healthy diet and exercise.

## 2015-02-16 NOTE — Progress Notes (Signed)
Pre-visit discussion using our clinic review tool. No additional management support is needed unless otherwise documented below in the visit note.  

## 2015-02-16 NOTE — Assessment & Plan Note (Signed)
No change in symptoms. Reviewed notes from Neurology. Recommended adding Neurontin 300mg  at bedtime. Follow up with neurology in 04/2015 and here  In 6 months and prn.

## 2015-02-16 NOTE — Assessment & Plan Note (Signed)
BP Readings from Last 3 Encounters:  02/16/15 130/70  01/15/15 156/71  07/03/14 131/70   BP well controlled. Continue current medications. Follow up in 6 months and prn.

## 2015-03-26 ENCOUNTER — Encounter: Payer: Self-pay | Admitting: Internal Medicine

## 2015-03-26 DIAGNOSIS — L400 Psoriasis vulgaris: Secondary | ICD-10-CM | POA: Diagnosis not present

## 2015-04-10 ENCOUNTER — Encounter: Payer: Self-pay | Admitting: *Deleted

## 2015-05-04 DIAGNOSIS — E538 Deficiency of other specified B group vitamins: Secondary | ICD-10-CM | POA: Diagnosis not present

## 2015-05-04 DIAGNOSIS — R200 Anesthesia of skin: Secondary | ICD-10-CM | POA: Diagnosis not present

## 2015-07-03 DIAGNOSIS — E119 Type 2 diabetes mellitus without complications: Secondary | ICD-10-CM | POA: Diagnosis not present

## 2015-07-03 LAB — HM DIABETES EYE EXAM

## 2015-07-10 ENCOUNTER — Encounter: Payer: Self-pay | Admitting: Internal Medicine

## 2015-07-24 DIAGNOSIS — L400 Psoriasis vulgaris: Secondary | ICD-10-CM | POA: Diagnosis not present

## 2015-07-24 DIAGNOSIS — D2271 Melanocytic nevi of right lower limb, including hip: Secondary | ICD-10-CM | POA: Diagnosis not present

## 2015-07-24 DIAGNOSIS — Z85828 Personal history of other malignant neoplasm of skin: Secondary | ICD-10-CM | POA: Diagnosis not present

## 2015-07-24 DIAGNOSIS — D225 Melanocytic nevi of trunk: Secondary | ICD-10-CM | POA: Diagnosis not present

## 2015-07-31 ENCOUNTER — Other Ambulatory Visit: Payer: Self-pay | Admitting: Internal Medicine

## 2015-08-17 ENCOUNTER — Ambulatory Visit: Payer: Medicare Other | Admitting: Internal Medicine

## 2015-08-24 ENCOUNTER — Encounter: Payer: Self-pay | Admitting: Internal Medicine

## 2015-08-27 ENCOUNTER — Encounter: Payer: Self-pay | Admitting: Internal Medicine

## 2015-08-27 ENCOUNTER — Ambulatory Visit (INDEPENDENT_AMBULATORY_CARE_PROVIDER_SITE_OTHER): Payer: Medicare Other | Admitting: Internal Medicine

## 2015-08-27 VITALS — BP 138/58 | HR 60 | Ht 64.0 in | Wt 166.4 lb

## 2015-08-27 DIAGNOSIS — G609 Hereditary and idiopathic neuropathy, unspecified: Secondary | ICD-10-CM | POA: Diagnosis not present

## 2015-08-27 DIAGNOSIS — J0101 Acute recurrent maxillary sinusitis: Secondary | ICD-10-CM

## 2015-08-27 DIAGNOSIS — I10 Essential (primary) hypertension: Secondary | ICD-10-CM

## 2015-08-27 DIAGNOSIS — L409 Psoriasis, unspecified: Secondary | ICD-10-CM

## 2015-08-27 LAB — CBC WITH DIFFERENTIAL/PLATELET
BASOS PCT: 0.6 % (ref 0.0–3.0)
Basophils Absolute: 0 10*3/uL (ref 0.0–0.1)
EOS PCT: 1.4 % (ref 0.0–5.0)
Eosinophils Absolute: 0.1 10*3/uL (ref 0.0–0.7)
HCT: 39.2 % (ref 36.0–46.0)
Hemoglobin: 12.9 g/dL (ref 12.0–15.0)
LYMPHS PCT: 24.6 % (ref 12.0–46.0)
Lymphs Abs: 1.9 10*3/uL (ref 0.7–4.0)
MCHC: 32.9 g/dL (ref 30.0–36.0)
MCV: 88.9 fl (ref 78.0–100.0)
MONOS PCT: 10 % (ref 3.0–12.0)
Monocytes Absolute: 0.8 10*3/uL (ref 0.1–1.0)
NEUTROS ABS: 4.8 10*3/uL (ref 1.4–7.7)
NEUTROS PCT: 63.4 % (ref 43.0–77.0)
Platelets: 338 10*3/uL (ref 150.0–400.0)
RBC: 4.4 Mil/uL (ref 3.87–5.11)
RDW: 13.2 % (ref 11.5–15.5)
WBC: 7.6 10*3/uL (ref 4.0–10.5)

## 2015-08-27 LAB — COMPREHENSIVE METABOLIC PANEL
ALT: 11 U/L (ref 0–35)
AST: 16 U/L (ref 0–37)
Albumin: 4.1 g/dL (ref 3.5–5.2)
Alkaline Phosphatase: 62 U/L (ref 39–117)
BUN: 13 mg/dL (ref 6–23)
CHLORIDE: 104 meq/L (ref 96–112)
CO2: 30 mEq/L (ref 19–32)
Calcium: 9.7 mg/dL (ref 8.4–10.5)
Creatinine, Ser: 0.61 mg/dL (ref 0.40–1.20)
GFR: 104.02 mL/min (ref >60.00)
GLUCOSE: 96 mg/dL (ref 70–99)
POTASSIUM: 4.5 meq/L (ref 3.5–5.1)
SODIUM: 139 meq/L (ref 135–145)
Total Bilirubin: 0.6 mg/dL (ref 0.2–1.2)
Total Protein: 7 g/dL (ref 6.0–8.3)

## 2015-08-27 MED ORDER — AZITHROMYCIN 250 MG PO TABS: ORAL_TABLET | ORAL | Status: DC

## 2015-08-27 MED ORDER — APREMILAST 30 MG PO TABS: ORAL_TABLET | ORAL | Status: DC

## 2015-08-27 NOTE — Assessment & Plan Note (Signed)
BP Readings from Last 3 Encounters:  08/27/15 138/58  02/16/15 130/70  01/15/15 156/71   BP well controlled. Renal function with labs. Continue current medications.

## 2015-08-27 NOTE — Patient Instructions (Signed)
Start Azithromcyin.  Labs today.  Follow up in 3 months.

## 2015-08-27 NOTE — Assessment & Plan Note (Signed)
Symptoms c/w sinusitis. Will start Azithromycin. She will call if symptoms are not improving.

## 2015-08-27 NOTE — Assessment & Plan Note (Signed)
Symptoms improved with [place]. Will continue to follow up with dermatology.

## 2015-08-27 NOTE — Assessment & Plan Note (Signed)
Symptoms improved with Gabapentin. Will continue.

## 2015-08-27 NOTE — Progress Notes (Signed)
Pre visit review using our clinic review tool, if applicable. No additional management support is needed unless otherwise documented below in the visit note. 

## 2015-08-27 NOTE — Progress Notes (Signed)
Subjective:    Patient ID: Molly Gregory, female    DOB: Jun 26, 1950, 65 y.o.   MRN: [id number]  HPI  65YO female presents for follow up.  Sinus congestion - Purulent nasal drainage since early spring. Some productive cough. No fever, dyspnea. Not taking anything for this.  Neuropathy - Seen by Dr. Manuella Ghazi. Symptoms have improved with neurontin.  Psoriasis - Symptoms improved with Otezla. Notes increased bruising with this medication.    Wt Readings from Last 3 Encounters:  08/27/15 166 lb 6.4 oz (75.479 kg)  02/16/15 163 lb (73.936 kg)  01/15/15 165 lb 4 oz (74.957 kg)   BP Readings from Last 3 Encounters:  08/27/15 138/58  02/16/15 130/70  01/15/15 156/71    Past Medical History  Diagnosis Date  . Arthritis   . Diabetes mellitus without complication ([place])   . Allergy   . Hyperlipidemia   . Hypertension   . H/O psoriasis   . History of chicken pox   . Hx of acute bronchitis   . Hx of migraines   . Head injury, closed 2013  . H/O exercise stress test     normal   Family History  Problem Relation Age of Onset  . Hypertension Maternal Aunt   . Aneurysm Maternal Aunt   . Diabetes Maternal Grandmother   . Heart disease Maternal Grandmother   . Hypertension Maternal Grandmother   . Diabetes Paternal Grandmother   . Heart disease Paternal Grandmother   . Hypertension Paternal Grandmother    Past Surgical History  Procedure Laterality Date  . Tubal ligation    . Carpal tunnel release Bilateral     both hands, Dr. Ranae Pila  . Eye surgery Left     cornea transplant, Dr. Sandra Cockayne   Social History   Social History  . Marital Status: Married    Spouse Name: N/A  . Number of Children: N/A  . Years of Education: N/A   Social History Main Topics  . Smoking status: Never Smoker   . Smokeless tobacco: None  . Alcohol Use: No  . Drug Use: No  . Sexual Activity: Not Currently   Other Topics Concern  . None   Social History Narrative   Lives with husband  in [place]. Cat in home. 1 daughter and 2 sons. 7 grandchildren.      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  HENT: Positive for congestion, rhinorrhea and sinus pressure. Negative for ear discharge, ear pain, facial swelling, hearing loss, mouth sores, nosebleeds, postnasal drip, sneezing, sore throat, tinnitus, trouble swallowing and voice change.   Eyes: Negative for pain, discharge, redness and visual disturbance.  Respiratory: Negative for cough, chest tightness, shortness of breath, wheezing and stridor.   Cardiovascular: Negative for chest pain, palpitations and leg swelling.  Musculoskeletal: Negative for myalgias, arthralgias, neck pain and neck stiffness.  Skin: Negative for color change and rash.  Neurological: Negative for dizziness, weakness, light-headedness and headaches.  Hematological: Negative for adenopathy. Bruises/bleeds easily.       Objective:    BP 138/58 mmHg  Pulse 60  Ht 5\' 4"  (1.626 m)  Wt 166 lb 6.4 oz (75.479 kg)  BMI 28.55 kg/m2  SpO2 99% Physical Exam  Constitutional: She is oriented to person, place, and time. She appears well-developed and well-nourished.  No distress.  HENT:  Head: Normocephalic and atraumatic.  Right Ear: Tympanic membrane and external ear normal.  Left Ear: Tympanic membrane and external ear normal.  Nose: Mucosal edema present.  Mouth/Throat: Oropharynx is clear and moist. No oropharyngeal exudate.  Eyes: Conjunctivae are normal. Pupils are equal, round, and reactive to light. Right eye exhibits no discharge. Left eye exhibits no discharge. No scleral icterus.  Neck: Normal range of motion. Neck supple. No tracheal deviation present. No thyromegaly present.  Cardiovascular: Normal rate, regular rhythm, normal heart sounds and intact distal pulses.  Exam reveals no gallop and no  friction rub.   No murmur heard. Pulmonary/Chest: Effort normal and breath sounds normal. No respiratory distress. She has no wheezes. She has no rales. She exhibits no tenderness.  Musculoskeletal: Normal range of motion. She exhibits no edema or tenderness.  Lymphadenopathy:    She has no cervical adenopathy.  Neurological: She is alert and oriented to person, place, and time. No cranial nerve deficit. She exhibits normal muscle tone. Coordination normal.  Skin: Skin is warm and dry. No rash noted. She is not diaphoretic. No erythema. No pallor.  Psychiatric: She has a normal mood and affect. Her behavior is normal. Judgment and thought content normal.          Assessment & Plan:   Problem List Items Addressed This Visit      Unprioritized   Acute recurrent maxillary sinusitis    Symptoms c/w sinusitis. Will start Azithromycin. She will call if symptoms are not improving.      Relevant Medications   azithromycin (ZITHROMAX Z-PAK) 250 MG tablet   Essential hypertension, benign - Primary    BP Readings from Last 3 Encounters:  08/27/15 138/58  02/16/15 130/70  01/15/15 156/71   BP well controlled. Renal function with labs. Continue current medications.      Hereditary and idiopathic peripheral neuropathy    Symptoms improved with Gabapentin. Will continue.      Relevant Medications   gabapentin (NEURONTIN) 300 MG capsule   Psoriasis    Symptoms improved with Otezla. Will continue to follow up with dermatology.      Relevant Orders   CBC with Differential/Platelet   Comprehensive metabolic panel       Return in about 3 months (around 11/27/2015) for Recheck.  Ronette Deter, MD Internal Medicine [facility]

## 2015-08-31 ENCOUNTER — Encounter: Payer: Self-pay | Admitting: Internal Medicine

## 2015-09-01 ENCOUNTER — Other Ambulatory Visit: Payer: Self-pay | Admitting: Internal Medicine

## 2015-09-03 ENCOUNTER — Telehealth: Payer: Self-pay

## 2015-09-03 MED ORDER — LOSARTAN POTASSIUM 100 MG PO TABS: ORAL_TABLET | ORAL | Status: DC

## 2015-09-03 MED ORDER — CITALOPRAM HYDROBROMIDE 10 MG PO TABS: 10.0000 mg | ORAL_TABLET | Freq: Every day | ORAL | Status: DC

## 2015-09-03 MED ORDER — PROPRANOLOL HCL ER 80 MG PO CP24: 80.0000 mg | ORAL_CAPSULE | Freq: Every day | ORAL | Status: DC

## 2015-09-03 NOTE — Addendum Note (Signed)
Addended by: Elpidio Galea T on: 09/03/2015 02:33 PM   Modules accepted: Orders

## 2015-09-03 NOTE — Telephone Encounter (Signed)
Medication refill

## 2015-09-03 NOTE — Telephone Encounter (Signed)
Fine to fill. 

## 2015-10-01 ENCOUNTER — Other Ambulatory Visit: Payer: Self-pay | Admitting: Internal Medicine

## 2015-10-02 ENCOUNTER — Other Ambulatory Visit: Payer: Self-pay

## 2015-10-02 MED ORDER — CITALOPRAM HYDROBROMIDE 10 MG PO TABS: 10.0000 mg | ORAL_TABLET | Freq: Every day | ORAL | 0 refills | Status: DC

## 2015-10-29 ENCOUNTER — Other Ambulatory Visit: Payer: Self-pay | Admitting: Surgical

## 2015-10-29 MED ORDER — AMLODIPINE BESYLATE 10 MG PO TABS: 10.0000 mg | ORAL_TABLET | Freq: Every day | ORAL | 0 refills | Status: DC

## 2015-10-31 ENCOUNTER — Other Ambulatory Visit: Payer: Self-pay | Admitting: Internal Medicine

## 2015-10-31 ENCOUNTER — Other Ambulatory Visit: Payer: Self-pay | Admitting: Family Medicine

## 2015-11-02 MED ORDER — CITALOPRAM HYDROBROMIDE 10 MG PO TABS: 10.0000 mg | ORAL_TABLET | Freq: Every day | ORAL | 3 refills | Status: DC

## 2015-11-07 DIAGNOSIS — G629 Polyneuropathy, unspecified: Secondary | ICD-10-CM | POA: Diagnosis not present

## 2015-11-22 DIAGNOSIS — L570 Actinic keratosis: Secondary | ICD-10-CM | POA: Diagnosis not present

## 2015-11-22 DIAGNOSIS — X32XXXA Exposure to sunlight, initial encounter: Secondary | ICD-10-CM | POA: Diagnosis not present

## 2015-11-22 DIAGNOSIS — L400 Psoriasis vulgaris: Secondary | ICD-10-CM | POA: Diagnosis not present

## 2015-11-22 DIAGNOSIS — R202 Paresthesia of skin: Secondary | ICD-10-CM | POA: Diagnosis not present

## 2015-11-26 ENCOUNTER — Telehealth: Payer: Self-pay | Admitting: Internal Medicine

## 2015-11-26 NOTE — Telephone Encounter (Signed)
Dr.Sonnenberg will want to see pt first before getting labs due to her being a new pt to him.

## 2015-11-26 NOTE — Telephone Encounter (Signed)
Pt has an appointment on 10/3 with Dr. Caryl Bis and was asking if she needs to have an A1C done before her appointment. Please Advise. Thank you!   Call pt @ [phone] (M)

## 2015-11-26 NOTE — Telephone Encounter (Signed)
Ok. Thank you.

## 2015-12-04 ENCOUNTER — Encounter: Payer: Self-pay | Admitting: Cardiovascular Disease

## 2015-12-04 ENCOUNTER — Telehealth: Payer: Self-pay | Admitting: Family Medicine

## 2015-12-04 ENCOUNTER — Ambulatory Visit (INDEPENDENT_AMBULATORY_CARE_PROVIDER_SITE_OTHER): Payer: Medicare Other | Admitting: Family Medicine

## 2015-12-04 ENCOUNTER — Ambulatory Visit (INDEPENDENT_AMBULATORY_CARE_PROVIDER_SITE_OTHER): Payer: Medicare Other | Admitting: Cardiovascular Disease

## 2015-12-04 ENCOUNTER — Encounter: Payer: Self-pay | Admitting: Family Medicine

## 2015-12-04 VITALS — BP 124/72 | HR 58 | Temp 98.2°F | Wt 155.0 lb

## 2015-12-04 VITALS — BP 124/54 | HR 59 | Ht 64.0 in | Wt 155.8 lb

## 2015-12-04 DIAGNOSIS — I10 Essential (primary) hypertension: Secondary | ICD-10-CM

## 2015-12-04 DIAGNOSIS — R319 Hematuria, unspecified: Secondary | ICD-10-CM | POA: Diagnosis not present

## 2015-12-04 DIAGNOSIS — R51 Headache: Secondary | ICD-10-CM | POA: Diagnosis not present

## 2015-12-04 DIAGNOSIS — R0789 Other chest pain: Secondary | ICD-10-CM | POA: Diagnosis not present

## 2015-12-04 DIAGNOSIS — R079 Chest pain, unspecified: Secondary | ICD-10-CM

## 2015-12-04 DIAGNOSIS — G8929 Other chronic pain: Secondary | ICD-10-CM | POA: Insufficient documentation

## 2015-12-04 DIAGNOSIS — E663 Overweight: Secondary | ICD-10-CM

## 2015-12-04 DIAGNOSIS — R7303 Prediabetes: Secondary | ICD-10-CM | POA: Diagnosis not present

## 2015-12-04 DIAGNOSIS — R519 Headache, unspecified: Secondary | ICD-10-CM | POA: Insufficient documentation

## 2015-12-04 DIAGNOSIS — G44229 Chronic tension-type headache, not intractable: Secondary | ICD-10-CM

## 2015-12-04 DIAGNOSIS — G609 Hereditary and idiopathic neuropathy, unspecified: Secondary | ICD-10-CM

## 2015-12-04 HISTORY — DX: Other chest pain: R07.89

## 2015-12-04 LAB — COMPREHENSIVE METABOLIC PANEL
ALT: 13 U/L (ref 0–35)
AST: 18 U/L (ref 0–37)
Albumin: 4 g/dL (ref 3.5–5.2)
Alkaline Phosphatase: 67 U/L (ref 39–117)
BUN: 17 mg/dL (ref 6–23)
CALCIUM: 9.5 mg/dL (ref 8.4–10.5)
CO2: 32 meq/L (ref 19–32)
Chloride: 103 mEq/L (ref 96–112)
Creatinine, Ser: 0.69 mg/dL (ref 0.40–1.20)
GFR: 90.16 mL/min (ref >60.00)
GLUCOSE: 114 mg/dL — AB (ref 70–99)
Potassium: 4.4 mEq/L (ref 3.5–5.1)
SODIUM: 139 meq/L (ref 135–145)
Total Bilirubin: 0.9 mg/dL (ref 0.2–1.2)
Total Protein: 7.3 g/dL (ref 6.0–8.3)

## 2015-12-04 LAB — SEDIMENTATION RATE: Sed Rate: 8 mm/hr (ref 0–30)

## 2015-12-04 LAB — POCT URINALYSIS DIPSTICK
Glucose, UA: NEGATIVE
Ketones, UA: NEGATIVE
NITRITE UA: NEGATIVE
PH UA: 6
Spec Grav, UA: 1.01
UROBILINOGEN UA: 0.2

## 2015-12-04 LAB — LIPID PANEL
CHOL/HDL RATIO: 4
Cholesterol: 206 mg/dL — ABNORMAL HIGH (ref 0–200)
HDL: 55.3 mg/dL (ref >39.00)
LDL CALC: 134 mg/dL — AB (ref 0–99)
NONHDL: 150.55
Triglycerides: 84 mg/dL (ref 0.0–149.0)
VLDL: 16.8 mg/dL (ref 0.0–40.0)

## 2015-12-04 LAB — HEMOGLOBIN A1C: Hgb A1c MFr Bld: 6 % (ref 4.6–6.5)

## 2015-12-04 NOTE — Progress Notes (Signed)
Cardiology Office Note   Date:  12/04/2015   ID:  Molly Gregory, DOB 02-04-1951, MRN [id number]  PCP:  [name], MD  Cardiologist:   Kathlyn Sacramento, MD   Chief Complaint  Patient presents with  . other    C/o chest pain and sharp hand pain. Meds reviewed verbally with pt.      History of Present Illness: Molly Gregory is a 65 y.o. female who Was referred by Dr. Caryl Bis for evaluation of chest pain. She reports having a stress test years ago after she was found to have premature beats post surgery. The stress test was normal. She has known history of diet-controlled diabetes, essential hypertension and neuropathy. She has family history of  coronary artery disease but not prematurely. She has known history of psoriasis and was started on [place] few months ago. She describes intermittent episodes of substernal chest pain described as sharp stabbing sensation lasting for a few seconds. This typically happens at rest and not with physical activities. She exercises in a regular basis with no exertional chest pain. She does complain of mild exertional dyspnea. There is no orthopnea, PND or leg edema. No palpitations, syncope or presyncope. She is not a smoker.   Past Medical History:  Diagnosis Date  . Allergy   . Arthritis   . Diabetes mellitus without complication ([place])   . H/O exercise stress test    normal  . H/O psoriasis   . Head injury, closed 2013  . History of chicken pox   . Hx of acute bronchitis   . Hx of migraines   . Hyperlipidemia   . Hypertension   . Irregular heart beat     Past Surgical History:  Procedure Laterality Date  . CARPAL TUNNEL RELEASE Bilateral    both hands, Dr. Ranae Pila  . EYE SURGERY Left    cornea transplant, Dr. Sandra Cockayne  . TUBAL LIGATION       Current Outpatient Prescriptions  Medication Sig Dispense Refill  . albuterol (PROVENTIL HFA;VENTOLIN HFA) 108 (90 BASE) MCG/ACT inhaler Inhale 2 puffs into the lungs every  6 (six) hours as needed for wheezing or shortness of breath. 1 Inhaler 0  . amLODipine (NORVASC) 5 MG tablet Take 5 mg by mouth daily.    Marland Kitchen Apremilast (OTEZLA) 30 MG TABS Take by mouth 2 (two) times daily.    . Calcium Carbonate-Vitamin D (CALCIUM + D PO) Take by mouth daily.    . citalopram (CELEXA) 10 MG tablet Take 1 tablet (10 mg total) by mouth daily. 30 tablet 3  . gabapentin (NEURONTIN) 300 MG capsule TAKE 1 CAPSULE (300 MG TOTAL) BY MOUTH NIGHTLY.  8  . Ibuprofen 200 MG CAPS Take by mouth.    . losartan (COZAAR) 100 MG tablet TAKE 1 TABLET (100 MG TOTAL) BY MOUTH DAILY. 90 tablet 0  . Multiple Vitamins-Minerals (WOMENS MULTIVITAMIN PLUS PO) Take by mouth.    . propranolol ER (INDERAL LA) 80 MG 24 hr capsule Take 1 capsule (80 mg total) by mouth daily. 90 capsule 0  . psyllium (METAMUCIL) 58.6 % packet Take 1 packet by mouth daily.    Marland Kitchen triamcinolone cream (KENALOG) 0.1 % Apply 1 application topically 2 (two) times daily.     No current facility-administered medications for this visit.     Allergies:   Penicillins    Social History:  The patient  reports that she has never smoked. She has never used smokeless tobacco. She reports that she  does not drink alcohol or use drugs.   Family History:  The patient's family history includes Aneurysm in her maternal aunt; Diabetes in her maternal grandmother and paternal grandmother; Heart disease in her maternal grandmother and paternal grandmother; Hypertension in her maternal aunt, maternal grandmother, and paternal grandmother.    ROS:  Please see the history of present illness.   Otherwise, review of systems are positive for none.   All other systems are reviewed and negative.    PHYSICAL EXAM: VS:  BP (!) 124/54 (BP Location: Right Arm, Patient Position: Sitting, Cuff Size: Normal)   Pulse (!) 59   Ht 5\' 4"  (1.626 m)   Wt 155 lb 12 oz (70.6 kg)   BMI 26.73 kg/m  , BMI Body mass index is 26.73 kg/m. GEN: Well nourished, well  developed, in no acute distress  HEENT: normal  Neck: no JVD, carotid bruits, or masses Cardiac: RRR; no murmurs, rubs, or gallops,no edema  Respiratory:  clear to auscultation bilaterally, normal work of breathing GI: soft, nontender, nondistended, + BS MS: no deformity or atrophy  Skin: warm and dry, no rash Neuro:  Strength and sensation are intact Psych: euthymic mood, full affect   EKG:  EKG is ordered today. The ekg ordered today demonstrates sinus bradycardia with nonspecific inferior T wave changes.Marland Kitchen Heart rate is 59 bpm.  Recent Labs: 08/27/2015: ALT 11; BUN 13; Creatinine, Ser 0.61; Hemoglobin 12.9; Platelets 338.0; Potassium 4.5; Sodium 139    Lipid Panel    Component Value Date/Time   CHOL 204 (H) 01/03/2015 0923   TRIG 101.0 01/03/2015 0923   HDL 63.40 01/03/2015 0923   CHOLHDL 3 01/03/2015 0923   VLDL 20.2 01/03/2015 0923   LDLCALC 120 (H) 01/03/2015 0923   LDLDIRECT 149.5 12/16/2012 0815      Wt Readings from Last 3 Encounters:  12/04/15 155 lb 12 oz (70.6 kg)  12/04/15 155 lb (70.3 kg)  08/27/15 166 lb 6.4 oz (75.5 kg)       PAD Screen 12/04/2015  Previous PAD dx? No  Previous surgical procedure? No  Pain with walking? No  Feet/toe relief with dangling? No  Painful, non-healing ulcers? No  Extremities discolored? No      ASSESSMENT AND PLAN:  1.  Atypical chest pain: The chest pain is overall atypical and does not sound anginal. She does complain of mild exertional dyspnea. Baseline ECG is slightly abnormal with nonspecific inferior T wave changes. Given her risk factors for coronary artery disease, I recommend evaluation with a treadmill nuclear stress test.  2. Essential hypertension: Blood pressure is controlled on current medications.    Disposition:   FU with me as needed.   Signed,  Kathlyn Sacramento, MD  12/04/2015 1:33 PM    [facility]

## 2015-12-04 NOTE — Assessment & Plan Note (Signed)
Patient with single episode of hematuria today though has had them in the past. UA with blood on the dipstick and also leukocytes. No nitrites. Could be related to infection though without nitrites this could be less likely. Discussed awaiting return of urine culture prior to treating with antibiotics given lack of systemic symptoms and lack of convincing symptoms for UTI. Doubt nephrolithiasis as cause given lack of history. She'll continue to monitor. If she develops further symptoms she'll let us know. She is given return precautions.

## 2015-12-04 NOTE — Assessment & Plan Note (Signed)
Weight is trending down. Patient has been exercising and watching what she eats. Encouraged continuing this.

## 2015-12-04 NOTE — Patient Instructions (Signed)
Nice to meet you. We will get some lab work today. We are going to refer you to cardiology for your chest pain. We will have you decrease your amlodipine dose to 5 mg daily. You will continue your other blood pressure medications. We will send your urine for culture to confirm if there is infection or not. If you develop abdominal pain, fevers, persistent chest pain, shortness of breath, sweatiness, or burning or changing symptoms please seek medical attention.

## 2015-12-04 NOTE — Progress Notes (Signed)
[name], MD Phone: [phone]  Molly Gregory is a 65 y.o. female who presents today for f/u.  HYPERTENSION  Disease Monitoring  Home BP Monitoring typically in the low 100s over 60s Chest pain- yes, see below    Dyspnea- no Medications  Compliance-  taking propranolol, losartan, amlodipine. Lightheadedness-  yes, with rising from a seated position  Edema- no  Chest pain: Patient notes over the last year she occasionally gets a central grabbing type sensation in her chest. Lasts for a few seconds. No radiation. No trouble breathing. No exertional component. She exercises quite heavily and has not had any issues with that. She does have a family history of heart disease in her grandparents. Both of her parents died when she was young so she does not know what their issues were. No shortness of breath or edema. No pain at this time.  Patient also noted hematuria today. Has had it intermittently in the past. Found when she was wiping her urethra. No vaginal bleeding. Notes some chronic frequency. No urgency. No dysuria. Some mild low back discomfort. No abdominal pain with this.  Neuropathy: Followed by Dr. Manuella Ghazi. Is on gabapentin. They believe it's due to prediabetes. She wonders if it is related to when she fell and hit her head about 5 years ago. She had no concussion at that time. Notes her toes are numb all the time. She has no other numbness. No weakness. Notes some sharp pains in her hands occasionally.  Patient additionally notes chronic intermittent left-sided headaches over her left eye and left temple. This has has been occurring since she fell and hit her head several years ago. Does not happen all the time. No associated vision changes, numbness, or weakness. Notes there is occasionally tenderness over her left temple with this.   PMH: nonsmoker.   ROS see history of present illness  Objective  Physical Exam Vitals:   12/04/15 0841  BP: 124/72  Pulse: (!) 58    Temp: 98.2 F (36.8 C)    BP Readings from Last 3 Encounters:  12/04/15 (!) 124/54  12/04/15 124/72  08/27/15 (!) 138/58   Wt Readings from Last 3 Encounters:  12/04/15 155 lb 12 oz (70.6 kg)  12/04/15 155 lb (70.3 kg)  08/27/15 166 lb 6.4 oz (75.5 kg)    Physical Exam  Constitutional: No distress.  HENT:  Head: Normocephalic and atraumatic.  Mouth/Throat: Oropharynx is clear and moist. No oropharyngeal exudate.  No tenderness over her left eye, there is mild soreness over her left temple, no tenderness or soreness over her right eye or right temple  Eyes: Conjunctivae are normal. Pupils are equal, round, and reactive to light.  Cardiovascular: Normal rate, regular rhythm and normal heart sounds.   Pulmonary/Chest: Effort normal and breath sounds normal.  Abdominal: Soft. Bowel sounds are normal. She exhibits no distension. There is no tenderness. There is no rebound and no guarding.  Musculoskeletal:  No midline spine tenderness, no midline spine step-off, no muscular back tenderness  Neurological: She is alert. Gait normal.  5 out of 5 strength bilateral quads, hamstrings, plantar flexion, and dorsiflexion, sensation to light touch intact bilateral lower extremities  Skin: Skin is warm and dry. She is not diaphoretic.   EKG: Sinus bradycardia, nonspecific T-wave changes, no apparent change from prior  Assessment/Plan: Please see individual problem list.  Essential hypertension, benign At goal. Per her report she has some low diastolic numbers at home with some mild lightheadedness. We will have  her half her dose of amlodipine and continue to monitor her blood pressure.  Hereditary and idiopathic peripheral neuropathy Followed by neurology. She'll continue gabapentin.  Overweight (BMI 25.0-29.9) Weight is trending down. Patient has been exercising and watching what she eats. Encouraged continuing this.  Atypical chest pain Patient's chest pain overall atypical. EKG  appears unchanged from previously. Given family history and risk factors we will refer to cardiology for evaluation. She is given return precautions.  Hematuria Patient with single episode of hematuria today though has had them in the past. UA with blood on the dipstick and also leukocytes. No nitrites. Could be related to infection though without nitrites this could be less likely. Discussed awaiting return of urine culture prior to treating with antibiotics given lack of systemic symptoms and lack of convincing symptoms for UTI. Doubt nephrolithiasis as cause given lack of history. She'll continue to monitor. If she develops further symptoms she'll let us know. She is given return precautions.  Chronic headache Patient with chronic intermittent left-sided headache following a fall several years ago. She noted some mild tenderness over the left temple and thus an ESR will be checked today. Given chronicity doubt this is related to temporal arteritis. If ESR elevated I did discuss treating with prednisone and sending for biopsy. If normal suspect this is chronic headache related. She's given return precautions.   Orders Placed This Encounter  Procedures  . Urine Culture  . Comp Met (CMET)  . Lipid Profile  . HgB A1c  . Sed Rate (ESR)  . Ambulatory referral to Cardiology    Referral Priority:   Routine    Referral Type:   Consultation    Referral Reason:   Specialty Services Required    Requested Specialty:   Cardiology    Number of Visits Requested:   1  . POCT Urinalysis Dipstick  . EKG 12-Lead    [name], MD [place]

## 2015-12-04 NOTE — Telephone Encounter (Signed)
Pt stated that you can leave her a message, she is in a seminar right now.  Thank you!

## 2015-12-04 NOTE — Assessment & Plan Note (Signed)
Patient's chest pain overall atypical. EKG appears unchanged from previously. Given family history and risk factors we will refer to cardiology for evaluation. She is given return precautions.

## 2015-12-04 NOTE — Assessment & Plan Note (Signed)
Followed by neurology. She'll continue gabapentin.

## 2015-12-04 NOTE — Assessment & Plan Note (Signed)
Patient with chronic intermittent left-sided headache following a fall several years ago. She noted some mild tenderness over the left temple and thus an ESR will be checked today. Given chronicity doubt this is related to temporal arteritis. If ESR elevated I did discuss treating with prednisone and sending for biopsy. If normal suspect this is chronic headache related. She's given return precautions.

## 2015-12-04 NOTE — Telephone Encounter (Signed)
Pt called returning your call. Not sure what it was regarding.   Call pt @ [phone]

## 2015-12-04 NOTE — Patient Instructions (Addendum)
Medication Instructions:  Your physician recommends that you continue on your current medications as directed. Please refer to the Current Medication list given to you today.  Labwork: none  Testing/Procedures: Your physician has requested that you have a lexiscan myoview. For further information please visit [email or web address]. Please follow instruction sheet, as given.  Molly Gregory  Your caregiver has ordered a Stress Test with nuclear imaging. The purpose of this test is to evaluate the blood supply to your heart muscle. This procedure is referred to as a "Non-Invasive Stress Test." This is because other than having an IV started in your vein, nothing is inserted or "invades" your body. Cardiac stress tests are done to find areas of poor blood flow to the heart by determining the extent of coronary artery disease (CAD). Some patients exercise on a treadmill, which naturally increases the blood flow to your heart, while others who are  unable to walk on a treadmill due to physical limitations have a pharmacologic/chemical stress agent called Lexiscan . This medicine will mimic walking on a treadmill by temporarily increasing your coronary blood flow.   Please note: these test may take anywhere between 2-4 hours to complete  PLEASE REPORT TO [place] AT THE FIRST DESK WILL DIRECT YOU WHERE TO GO  Date of Procedure:___Tuesday, Oct 10_____________  Arrival Time for Procedure:__8:45am_____  Instructions regarding medication:    __xx__:  Hold propranolol  night before procedure and morning of procedure    PLEASE NOTIFY THE OFFICE AT LEAST 24 HOURS IN ADVANCE IF YOU ARE UNABLE TO KEEP YOUR APPOINTMENT.  [phone] AND  PLEASE NOTIFY NUCLEAR MEDICINE AT [facility] AT LEAST 24 HOURS IN ADVANCE IF YOU ARE UNABLE TO KEEP YOUR APPOINTMENT. [phone]  How to prepare for your Myoview test:  1. Do not eat or drink after midnight 2. No caffeine for 24 hours  prior to test 3. No smoking 24 hours prior to test. 4. Your medication may be taken with water.  If your doctor stopped a medication because of this test, do not take that medication. 5. Ladies, please do not wear dresses.  Skirts or pants are appropriate. Please wear a short sleeve shirt. 6. No perfume, cologne or lotion. 7. Wear comfortable walking shoes. No heels!            Follow-Up: Your physician recommends that you schedule a follow-up appointment as needed.    Any Other Special Instructions Will Be Listed Below (If Applicable).     If you need a refill on your cardiac medications before your next appointment, please call your pharmacy.  Cardiac Nuclear Scanning A cardiac nuclear scan is used to check your heart for problems, such as the following:  A portion of the heart is not getting enough blood.  Part of the heart muscle has died, which happens with a heart attack.  The heart wall is not working normally.  In this test, a radioactive dye (tracer) is injected into your bloodstream. After the tracer has traveled to your heart, a scanning device is used to measure how much of the tracer is absorbed by or distributed to various areas of your heart. LET [facility] CARE PROVIDER KNOW ABOUT:  Any allergies you have.  All medicines you are taking, including vitamins, herbs, eye drops, creams, and over-the-counter medicines.  Previous problems you or members of your family have had with the use of anesthetics.  Any blood disorders you have.  Previous surgeries you have had.  Medical conditions you have.  RISKS AND COMPLICATIONS Generally, this is a safe procedure. However, as with any procedure, problems can occur. Possible problems include:   Serious chest pain.  Rapid heartbeat.  Sensation of warmth in your chest. This usually passes quickly. BEFORE THE PROCEDURE Ask your health care provider about changing or stopping your regular  medicines. PROCEDURE This procedure is usually done at a hospital and takes 2-4 hours.  An IV tube is inserted into one of your veins.  Your health care provider will inject a small amount of radioactive tracer through the tube.  You will then wait for 20-40 minutes while the tracer travels through your bloodstream.  You will lie down on an exam table so images of your heart can be taken. Images will be taken for about 15-20 minutes.  You will exercise on a treadmill or stationary bike. While you exercise, your heart activity will be monitored with an electrocardiogram (ECG), and your blood pressure will be checked.  If you are unable to exercise, you may be given a medicine to make your heart beat faster.  When blood flow to your heart has peaked, tracer will again be injected through the IV tube.  After 20-40 minutes, you will get back on the exam table and have more images taken of your heart.  When the procedure is over, your IV tube will be removed. AFTER THE PROCEDURE  You will likely be able to leave shortly after the test. Unless your health care provider tells you otherwise, you may return to your normal schedule, including diet, activities, and medicines.  Make sure you find out how and when you will get your test results.   This information is not intended to replace advice given to you by your health care provider. Make sure you discuss any questions you have with your health care provider.   Document Released: 03/14/2004 Document Revised: 02/22/2013 Document Reviewed: 01/26/2013 Elsevier Interactive Patient Education [employer].

## 2015-12-04 NOTE — Assessment & Plan Note (Signed)
At goal. Per her report she has some low diastolic numbers at home with some mild lightheadedness. We will have her half her dose of amlodipine and continue to monitor her blood pressure.

## 2015-12-05 ENCOUNTER — Other Ambulatory Visit: Payer: Self-pay | Admitting: Family Medicine

## 2015-12-05 DIAGNOSIS — E785 Hyperlipidemia, unspecified: Secondary | ICD-10-CM

## 2015-12-05 MED ORDER — ATORVASTATIN CALCIUM 40 MG PO TABS: 40.0000 mg | ORAL_TABLET | Freq: Every day | ORAL | 3 refills | Status: DC

## 2015-12-05 NOTE — Telephone Encounter (Signed)
Notified patient of results. See results note.

## 2015-12-06 ENCOUNTER — Telehealth: Payer: Self-pay

## 2015-12-06 DIAGNOSIS — R319 Hematuria, unspecified: Secondary | ICD-10-CM

## 2015-12-06 LAB — URINE CULTURE

## 2015-12-06 NOTE — Telephone Encounter (Signed)
Spoke to patient. Advised that her urine culture did not reveal an infection. She did not provide enough. In order for [place] to look at it under the microscope,  suggest re-collection of urine at her lab visit in one month to evaluate for blood in her urine per Dr. Caryl Bis. Patient verbalized understanding.

## 2015-12-07 ENCOUNTER — Other Ambulatory Visit (INDEPENDENT_AMBULATORY_CARE_PROVIDER_SITE_OTHER): Payer: Medicare Other

## 2015-12-07 DIAGNOSIS — R319 Hematuria, unspecified: Secondary | ICD-10-CM | POA: Diagnosis not present

## 2015-12-07 LAB — URINALYSIS, MICROSCOPIC ONLY: RBC / HPF: NONE SEEN (ref 0–?)

## 2015-12-07 MED ORDER — AMLODIPINE BESYLATE 5 MG PO TABS: 5.0000 mg | ORAL_TABLET | Freq: Every day | ORAL | 2 refills | Status: DC

## 2015-12-07 NOTE — Telephone Encounter (Signed)
Spoke to patient. Advised ok to come in today to provide urine sample. Pt was grateful.

## 2015-12-07 NOTE — Telephone Encounter (Signed)
Pt stated that he would like to come in today to give a urine sample, she stated that her myChart read of her having bacteria in her urine. She stated that she will be out of of town this weekend and would feel better if she knew that she did not have infection.  Pt contact [phone]

## 2015-12-07 NOTE — Addendum Note (Signed)
Addended by: Milta Deiters on: 12/07/2015 02:39 PM   Modules accepted: Orders

## 2015-12-07 NOTE — Addendum Note (Signed)
Addended by: Frutoso Chase A on: 12/07/2015 10:43 AM   Modules accepted: Orders

## 2015-12-07 NOTE — Addendum Note (Signed)
Addended by: Milta Deiters on: 12/07/2015 10:29 AM   Modules accepted: Orders

## 2015-12-07 NOTE — Telephone Encounter (Signed)
There was some question regarding her amlodipine. Patient can split her current prescription in half. Thanks

## 2015-12-10 ENCOUNTER — Telehealth: Payer: Self-pay | Admitting: Cardiovascular Disease

## 2015-12-10 NOTE — Telephone Encounter (Signed)
Reviewed myoview instructions w/pt who verbalized understanding with no questions at this time.  

## 2015-12-11 ENCOUNTER — Encounter
Admission: RE | Admit: 2015-12-11 | Discharge: 2015-12-11 | Disposition: A | Discharge status: Home / Self Care | Payer: Medicare Other | Source: Ambulatory Visit | Attending: Cardiovascular Disease | Admitting: Cardiovascular Disease

## 2015-12-11 DIAGNOSIS — R079 Chest pain, unspecified: Secondary | ICD-10-CM

## 2015-12-11 MED ORDER — TECHNETIUM TC 99M TETROFOSMIN IV KIT
30.8300 | PACK | Freq: Once | INTRAVENOUS | Status: AC | PRN
  Administered 2015-12-11: 30.83 via INTRAVENOUS

## 2015-12-11 MED ORDER — TECHNETIUM TC 99M TETROFOSMIN IV KIT
13.0000 | PACK | Freq: Once | INTRAVENOUS | Status: AC | PRN
  Administered 2015-12-11: 13.424 via INTRAVENOUS

## 2015-12-11 MED ORDER — REGADENOSON 0.4 MG/5ML IV SOLN
0.4000 mg | Freq: Once | INTRAVENOUS | Status: AC
  Administered 2015-12-11: 0.4 mg via INTRAVENOUS

## 2015-12-12 LAB — NM MYOCAR MULTI W/SPECT W/WALL MOTION / EF
CHL CUP NUCLEAR SDS: 0
CHL CUP STRESS STAGE 1 SPEED: 0 mph
CHL CUP STRESS STAGE 2 HR: 65 {beats}/min
CHL CUP STRESS STAGE 2 SPEED: 0 mph
CHL CUP STRESS STAGE 3 DBP: 45 mmHg
CHL CUP STRESS STAGE 3 SBP: 149 mmHg
CHL CUP STRESS STAGE 3 SPEED: 1.7 mph
CHL CUP STRESS STAGE 4 GRADE: 12 %
CHL CUP STRESS STAGE 4 SPEED: 2.5 mph
CHL CUP STRESS STAGE 5 GRADE: 14 %
CHL CUP STRESS STAGE 5 HR: 105 {beats}/min
CHL CUP STRESS STAGE 5 SBP: 173 mmHg
CHL CUP STRESS STAGE 5 SPEED: 3.4 mph
CHL CUP STRESS STAGE 6 GRADE: 16 %
CHL CUP STRESS STAGE 6 HR: 118 {beats}/min
CHL CUP STRESS STAGE 6 SPEED: 4.2 mph
CHL CUP STRESS STAGE 7 SPEED: 3.4 mph
CHL CUP STRESS STAGE 8 GRADE: 0 %
CHL CUP STRESS STAGE 9 HR: 93 {beats}/min
CHL CUP STRESS STAGE 9 SPEED: 0 mph
CSEPEW: 12.2 METS
CSEPPMHR: 77 %
LV dias vol: 73 mL (ref 46–106)
LV sys vol: 32 mL
Peak HR: 118 {beats}/min
Percent HR: 66 %
Rest HR: 90 {beats}/min
SRS: 2
SSS: 2
Stage 1 Grade: 0 %
Stage 1 HR: 65 {beats}/min
Stage 2 Grade: 0.1 %
Stage 3 Grade: 10 %
Stage 3 HR: 86 {beats}/min
Stage 4 DBP: 48 mmHg
Stage 4 HR: 92 {beats}/min
Stage 4 SBP: 164 mmHg
Stage 5 DBP: 55 mmHg
Stage 7 Grade: 13.7 %
Stage 7 HR: 118 {beats}/min
Stage 8 HR: 95 {beats}/min
Stage 8 Speed: 0 mph
Stage 9 Grade: 0 %
TID: 1

## 2015-12-12 NOTE — Addendum Note (Signed)
Addended by: Britt Bottom on: 12/12/2015 03:16 PM   Modules accepted: Orders

## 2015-12-25 ENCOUNTER — Other Ambulatory Visit: Payer: Self-pay | Admitting: Internal Medicine

## 2015-12-25 MED ORDER — LOSARTAN POTASSIUM 100 MG PO TABS
ORAL_TABLET | ORAL | 1 refills | Status: DC
Start: 2015-12-25 — End: 2016-06-16

## 2015-12-25 MED ORDER — PROPRANOLOL HCL ER 80 MG PO CP24: 80.0000 mg | ORAL_CAPSULE | Freq: Every day | ORAL | 1 refills | Status: DC

## 2016-01-01 DIAGNOSIS — H1851 Endothelial corneal dystrophy: Secondary | ICD-10-CM | POA: Diagnosis not present

## 2016-01-03 ENCOUNTER — Ambulatory Visit: Payer: Medicare Other

## 2016-01-06 ENCOUNTER — Encounter: Payer: Self-pay | Admitting: Family Medicine

## 2016-01-07 ENCOUNTER — Telehealth: Payer: Self-pay | Admitting: Surgical

## 2016-01-07 ENCOUNTER — Other Ambulatory Visit: Payer: Self-pay | Admitting: Surgical

## 2016-01-07 ENCOUNTER — Other Ambulatory Visit (INDEPENDENT_AMBULATORY_CARE_PROVIDER_SITE_OTHER): Payer: Medicare Other

## 2016-01-07 DIAGNOSIS — E785 Hyperlipidemia, unspecified: Secondary | ICD-10-CM | POA: Diagnosis not present

## 2016-01-07 DIAGNOSIS — R319 Hematuria, unspecified: Secondary | ICD-10-CM

## 2016-01-07 LAB — HEPATIC FUNCTION PANEL
ALT: 17 U/L (ref 0–35)
AST: 19 U/L (ref 0–37)
Albumin: 4.3 g/dL (ref 3.5–5.2)
Alkaline Phosphatase: 77 U/L (ref 39–117)
BILIRUBIN DIRECT: 0.2 mg/dL (ref 0.0–0.3)
BILIRUBIN TOTAL: 1 mg/dL (ref 0.2–1.2)
Total Protein: 7.2 g/dL (ref 6.0–8.3)

## 2016-01-07 LAB — POCT URINALYSIS DIPSTICK
Glucose, UA: NEGATIVE
KETONES UA: NEGATIVE
LEUKOCYTES UA: NEGATIVE
Nitrite, UA: NEGATIVE
PROTEIN UA: 30
Spec Grav, UA: 1.025
Urobilinogen, UA: 0.2
pH, UA: 5

## 2016-01-07 LAB — LDL CHOLESTEROL, DIRECT: Direct LDL: 51 mg/dL

## 2016-01-07 NOTE — Addendum Note (Signed)
Addended by: Leeanne Rio on: 01/07/2016 03:43 PM   Modules accepted: Orders

## 2016-01-07 NOTE — Addendum Note (Signed)
Addended by: Leeanne Rio on: 01/07/2016 03:54 PM   Modules accepted: Orders

## 2016-01-07 NOTE — Telephone Encounter (Signed)
Pt came in for labs this morning & mentioned this mychart message. She left a urine. If okay to run, please place order & let me know.   Thanks

## 2016-01-07 NOTE — Progress Notes (Signed)
Placed order for U-DIP

## 2016-01-07 NOTE — Telephone Encounter (Signed)
Scheduled patient for appointment due to having lower abdominal and back pain

## 2016-01-08 ENCOUNTER — Other Ambulatory Visit (HOSPITAL_COMMUNITY)
Admission: RE | Admit: 2016-01-08 | Discharge: 2016-01-08 | Disposition: A | Discharge status: Home / Self Care | Payer: Medicare Other | Source: Ambulatory Visit | Attending: Family Medicine | Admitting: Family Medicine

## 2016-01-08 ENCOUNTER — Encounter: Payer: Self-pay | Admitting: Family Medicine

## 2016-01-08 ENCOUNTER — Ambulatory Visit (INDEPENDENT_AMBULATORY_CARE_PROVIDER_SITE_OTHER): Payer: Medicare Other | Admitting: Family Medicine

## 2016-01-08 VITALS — BP 122/74 | HR 63 | Temp 98.4°F | Wt 154.0 lb

## 2016-01-08 DIAGNOSIS — Z87898 Personal history of other specified conditions: Secondary | ICD-10-CM

## 2016-01-08 DIAGNOSIS — Z01419 Encounter for gynecological examination (general) (routine) without abnormal findings: Secondary | ICD-10-CM | POA: Diagnosis not present

## 2016-01-08 DIAGNOSIS — Z1151 Encounter for screening for human papillomavirus (HPV): Secondary | ICD-10-CM | POA: Diagnosis not present

## 2016-01-08 DIAGNOSIS — J069 Acute upper respiratory infection, unspecified: Secondary | ICD-10-CM | POA: Insufficient documentation

## 2016-01-08 DIAGNOSIS — Z8742 Personal history of other diseases of the female genital tract: Secondary | ICD-10-CM

## 2016-01-08 DIAGNOSIS — R319 Hematuria, unspecified: Secondary | ICD-10-CM | POA: Diagnosis not present

## 2016-01-08 LAB — URINALYSIS, MICROSCOPIC ONLY
Bacteria, UA: NONE SEEN [HPF]
Casts: NONE SEEN [LPF]
Yeast: NONE SEEN [HPF]

## 2016-01-08 NOTE — Assessment & Plan Note (Signed)
Continues to have intermittent issues with this. Urine microscopy with no blood. Did have some calcium oxalate crystals. Pelvic exam performed today with no urethral opening issues. Overall benign vaginal and cervical exam. Pap smear completed given prior history of abnormal Pap smear. We will await urine culture results. If positive we will treat for UTI. If negative will refer to urology.

## 2016-01-08 NOTE — Patient Instructions (Signed)
Nice to see you. We are going to await your urine culture. If this is negative we will refer you to urology. We will call you with your Pap smear results. Your upper respiratory symptoms are likely related to a virus. You can start Flonase and Claritin over-the-counter for this. If your symptoms do not improve by next week we'll come back and see [place]. If you develop fevers, cough productive of blood, or trouble breathing please seek medical attention immediately.

## 2016-01-08 NOTE — Assessment & Plan Note (Signed)
Symptoms most consistent with viral upper respiratory infection. We will start the patient on Claritin and Flonase over-the-counter. She will continue to monitor. If not improving by next week she'll follow-up. Given return precautions.

## 2016-01-08 NOTE — Progress Notes (Signed)
  Molly Rumps, MD Phone: [phone]  Molly Gregory is a 65 y.o. female who presents today for same-day visit.  Patient notes recurrent issues with hematuria. Notes a couple of occasions where she will urinate and then wiped and has some blood on the tissue. Notes it only occurs after urinating. No blood on her underwear between urination cycles. Some urge and frequency. Some mild low back discomfort though no radiation. She has not had a menstrual period since her early 46s. She notes no blood in the toilet water. No numbness, weakness, loss of bowel or bladder function, saddle anesthesia, or fevers. She reports a history of abnormal Pap smear 20+ years ago requiring cryotherapy.  Patient additionally notes she woke up with sore throat on Saturday. Notes some frontal sinus pressure as well. Some cough. Throat is improving. Notes she is blowing clear mucus out of her nose. Nonproductive cough. No fevers. No shortness of breath. Mucinex did not help.  PMH: nonsmoker.   ROS see history of present illness  Objective  Physical Exam Vitals:   01/08/16 1017  BP: 122/74  Pulse: 63  Temp: 98.4 F (36.9 C)    BP Readings from Last 3 Encounters:  01/08/16 122/74  12/04/15 (!) 124/54  12/04/15 124/72   Wt Readings from Last 3 Encounters:  01/08/16 154 lb (69.9 kg)  12/04/15 155 lb 12 oz (70.6 kg)  12/04/15 155 lb (70.3 kg)    Physical Exam  Constitutional: She is well-developed, well-nourished, and in no distress.  HENT:  Head: Normocephalic and atraumatic.  Mouth/Throat: Oropharynx is clear and moist. No oropharyngeal exudate.  Normal TMs bilaterally  Eyes: Conjunctivae are normal. Pupils are equal, round, and reactive to light.  Neck: Neck supple.  Cardiovascular: Normal rate, regular rhythm and normal heart sounds.   Pulmonary/Chest: Effort normal and breath sounds normal.  Abdominal: Soft. Bowel sounds are normal. She exhibits no distension. There is no tenderness.  There is no rebound and no guarding.  Genitourinary: Vagina normal, uterus normal, cervix normal, right adnexa normal and left adnexa normal. No vaginal discharge found.  Genitourinary Comments: Normal urethral opening  Lymphadenopathy:    She has no cervical adenopathy.  Neurological: She is alert. Gait normal.  Skin: Skin is warm and dry.     Assessment/Plan: Please see individual problem list.  Hematuria Continues to have intermittent issues with this. Urine microscopy with no blood. Did have some calcium oxalate crystals. Pelvic exam performed today with no urethral opening issues. Overall benign vaginal and cervical exam. Pap smear completed given prior history of abnormal Pap smear. We will await urine culture results. If positive we will treat for UTI. If negative will refer to urology.  Acute upper respiratory infection Symptoms most consistent with viral upper respiratory infection. We will start the patient on Claritin and Flonase over-the-counter. She will continue to monitor. If not improving by next week she'll follow-up. Given return precautions.   Molly Rumps, MD [place]

## 2016-01-08 NOTE — Progress Notes (Signed)
Pre visit review using our clinic review tool, if applicable. No additional management support is needed unless otherwise documented below in the visit note. 

## 2016-01-09 ENCOUNTER — Other Ambulatory Visit: Payer: Self-pay | Admitting: Family Medicine

## 2016-01-09 DIAGNOSIS — R310 Gross hematuria: Secondary | ICD-10-CM

## 2016-01-09 LAB — CYTOLOGY - PAP
Diagnosis: NEGATIVE
HPV: NOT DETECTED

## 2016-01-09 LAB — URINE CULTURE: Organism ID, Bacteria: NO GROWTH

## 2016-01-21 ENCOUNTER — Ambulatory Visit (INDEPENDENT_AMBULATORY_CARE_PROVIDER_SITE_OTHER): Payer: Medicare Other | Admitting: Family Medicine

## 2016-01-21 ENCOUNTER — Other Ambulatory Visit: Payer: Self-pay | Admitting: Family Medicine

## 2016-01-21 ENCOUNTER — Telehealth: Payer: Self-pay | Admitting: Family Medicine

## 2016-01-21 ENCOUNTER — Encounter: Payer: Self-pay | Admitting: Family Medicine

## 2016-01-21 VITALS — BP 138/74 | HR 68 | Temp 98.4°F | Wt 155.2 lb

## 2016-01-21 DIAGNOSIS — R3915 Urgency of urination: Secondary | ICD-10-CM | POA: Diagnosis not present

## 2016-01-21 DIAGNOSIS — R300 Dysuria: Secondary | ICD-10-CM | POA: Diagnosis not present

## 2016-01-21 DIAGNOSIS — R310 Gross hematuria: Secondary | ICD-10-CM | POA: Diagnosis not present

## 2016-01-21 LAB — POCT URINALYSIS DIPSTICK
Bilirubin, UA: NEGATIVE
GLUCOSE UA: NEGATIVE
KETONES UA: NEGATIVE
LEUKOCYTES UA: NEGATIVE
Nitrite, UA: NEGATIVE
PROTEIN UA: NEGATIVE
Urobilinogen, UA: 0.2
pH, UA: 6.5

## 2016-01-21 LAB — URINALYSIS, MICROSCOPIC ONLY
RBC / HPF: NONE SEEN (ref 0–?)
WBC, UA: NONE SEEN (ref 0–?)

## 2016-01-21 NOTE — Progress Notes (Addendum)
Molly Rumps, MD Phone: [phone]  Molly Gregory is a 65 y.o. female who presents today for follow-up.  Patient has been seen multiple times over the last month or so for hematuria, urinary urgency, urinary frequency, and lower abdominal pressure sensation. This has been a recurrent issue since the beginning of October. She notes every once in while she notes blood when she wipes after urinating. She has no blood at any other time. No blood on her underwear. No vaginal bleeding. She notes some mild low back discomfort with this. This has been going on since the start of her hematuria. She notes urgency and frequency. No dysuria. Notes she feels bloated. She notes a suprapubic pressure sensation. She notes no numbness, weakness, loss of bladder function, saddle anesthesia, or fevers. No vomiting, nausea, or diarrhea. No vaginal bleeding. She has an appointment with urology next week. She has a cousin who was recently diagnosed with kidney cancer. Patient's workup thus far has included urine dipsticks, urine microscopy, and urine culture. He has not had any hematuria on microscopy. Her urine cultures have been negative. She had a pelvic exam and a Pap smear at her last visit that were unremarkable.  ROS see history of present illness  Objective  Physical Exam Vitals:   01/21/16 1112  BP: 138/74  Pulse: 68  Temp: 98.4 F (36.9 C)    BP Readings from Last 3 Encounters:  01/21/16 138/74  01/08/16 122/74  12/04/15 (!) 124/54   Wt Readings from Last 3 Encounters:  01/21/16 155 lb 3.2 oz (70.4 kg)  01/08/16 154 lb (69.9 kg)  12/04/15 155 lb 12 oz (70.6 kg)    Physical Exam  Constitutional: No distress.  Cardiovascular: Normal rate, regular rhythm and normal heart sounds.   Pulmonary/Chest: Effort normal and breath sounds normal.  Abdominal: Soft. Bowel sounds are normal. She exhibits no distension. There is no rebound and no guarding.  Mild pressure sensation on suprapubic  exam, no other tenderness in her abdomen  Musculoskeletal:  No midline spine tenderness, no midline spine step-off, no muscular back tenderness, no CVA tenderness  Neurological: She is alert. Gait normal.  5 out of 5 strength bilateral quads, hamstrings, plantar flexion, and dorsiflexion, sensation to light touch intact bilateral lower extremities, 2+ patellar reflexes  Skin: Skin is warm and dry. She is not diaphoretic.     Assessment/Plan: Please see individual problem list.  Hematuria Patient continues to have intermittent issues with this. She has had the same symptoms that have not resolved since the start of her evaluation. No blood on microscopy. No positive urine culture. Pelvic exam previously benign. One of her microscopy results did have calcium oxalate crystals which could indicate nephrolithiasis as cause though she does not appear to be in a level of discomfort that would indicate nephrolithiasis. We will send her urine for microscopy and culture again. Given persistent symptoms we will obtain CT abdomen and pelvis without contrast to evaluate for stone or other cause. We will have the patient see urology as scheduled. She is given return precautions.   Orders Placed This Encounter  Procedures  . Urine Culture  . CT ABDOMEN PELVIS W WO CONTRAST    This exam should ONLY be ordered for initial diagnosis or follow up of known pancreatic/liver/renal/bladder masses.    Standing Status:   Future    Standing Expiration Date:   04/22/2017    Order Specific Question:   If indicated for the ordered procedure, I authorize the administration of  contrast media per Radiology protocol    Answer:   Yes    Order Specific Question:   Reason for Exam (SYMPTOM  OR DIAGNOSIS REQUIRED)    Answer:   gross hematuria, urinary frequency and urgency at times, mild suprapubic pressure sensation    Order Specific Question:   Preferred imaging location?    Answer:   [facility]  . Urine Microscopic  Only  . POCT Urinalysis Dipstick    Molly Rumps, MD [place]

## 2016-01-21 NOTE — Addendum Note (Signed)
Addended by: Caryl Bis, ERIC G on: 01/21/2016 01:20 PM   Modules accepted: Orders

## 2016-01-21 NOTE — Assessment & Plan Note (Signed)
Patient continues to have intermittent issues with this. She has had the same symptoms that have not resolved since the start of her evaluation. No blood on microscopy. No positive urine culture. Pelvic exam previously benign. One of her microscopy results did have calcium oxalate crystals which could indicate nephrolithiasis as cause though she does not appear to be in a level of discomfort that would indicate nephrolithiasis. We will send her urine for microscopy and culture again. Given persistent symptoms we will obtain CT abdomen and pelvis without contrast to evaluate for stone or other cause. We will have the patient see urology as scheduled. She is given return precautions.

## 2016-01-21 NOTE — Telephone Encounter (Signed)
Pt called about wanting to know if a Rx can be called in for the kidney infection per pt. Pt also states about Dr Caryl Bis mentioning pt getting a xray. Pt says that the appt to see the UA will not be until 01/29/16 pt is worried about it going into her blood stream. Please advise?  Call pt @ [phone]. Thank you!

## 2016-01-21 NOTE — Progress Notes (Signed)
Pre visit review using our clinic review tool, if applicable. No additional management support is needed unless otherwise documented below in the visit note. 

## 2016-01-21 NOTE — Patient Instructions (Signed)
Nice to see you. We are going to get you set up for a CT scan to evaluate for cause of your symptoms. If you develop fevers, abdominal pain, or any new or changing symptoms please seek medical attention.

## 2016-01-21 NOTE — Telephone Encounter (Signed)
Scheduled patient to com in at 11:15AM

## 2016-01-22 ENCOUNTER — Ambulatory Visit
Admission: RE | Admit: 2016-01-22 | Discharge: 2016-01-22 | Disposition: A | Discharge status: Home / Self Care | Payer: Medicare Other | Source: Ambulatory Visit | Attending: Family Medicine | Admitting: Family Medicine

## 2016-01-22 DIAGNOSIS — N200 Calculus of kidney: Secondary | ICD-10-CM | POA: Diagnosis not present

## 2016-01-22 DIAGNOSIS — D259 Leiomyoma of uterus, unspecified: Secondary | ICD-10-CM | POA: Insufficient documentation

## 2016-01-22 DIAGNOSIS — I700 Atherosclerosis of aorta: Secondary | ICD-10-CM | POA: Insufficient documentation

## 2016-01-22 DIAGNOSIS — R310 Gross hematuria: Secondary | ICD-10-CM | POA: Insufficient documentation

## 2016-01-22 DIAGNOSIS — K449 Diaphragmatic hernia without obstruction or gangrene: Secondary | ICD-10-CM | POA: Diagnosis not present

## 2016-01-22 DIAGNOSIS — K5730 Diverticulosis of large intestine without perforation or abscess without bleeding: Secondary | ICD-10-CM | POA: Diagnosis not present

## 2016-01-22 DIAGNOSIS — K8020 Calculus of gallbladder without cholecystitis without obstruction: Secondary | ICD-10-CM | POA: Diagnosis not present

## 2016-01-22 LAB — POCT I-STAT CREATININE: Creatinine, Ser: 0.7 mg/dL (ref 0.44–1.00)

## 2016-01-22 MED ORDER — IOPAMIDOL (ISOVUE-300) INJECTION 61%
125.0000 mL | Freq: Once | INTRAVENOUS | Status: AC | PRN
  Administered 2016-01-22: 125 mL via INTRAVENOUS

## 2016-01-23 ENCOUNTER — Telehealth: Payer: Self-pay | Admitting: Family Medicine

## 2016-01-23 MED ORDER — CEFDINIR 300 MG PO CAPS: 300.0000 mg | ORAL_CAPSULE | Freq: Two times a day (BID) | ORAL | 0 refills | Status: DC

## 2016-01-23 MED ORDER — CIPROFLOXACIN HCL 250 MG PO TABS: 250.0000 mg | ORAL_TABLET | Freq: Two times a day (BID) | ORAL | 0 refills | Status: DC

## 2016-01-23 NOTE — Telephone Encounter (Signed)
Attempted to call patient with results of urine culture and CT scan. Left message asking her to call back to the office. We will await her call.

## 2016-01-23 NOTE — Telephone Encounter (Signed)
Patient called back. Spoke with her regarding results. Advised of findings. She will see urology next week as planned. Discussed urine culture results. She has continued to have urinary urgency and frequency. Given continued symptoms we will proceed with UTI treatment. She reports her allergy to penicillin was a rash when she was younger. She reports no history of anaphylactic reaction. We will proceed with [facility] treatment for GBS UTI. Advised if she has any issues she should seek medical attention.

## 2016-01-23 NOTE — Telephone Encounter (Signed)
Called back and spoke with patient regarding results. She mentioned when I spoke with her earlier that she did not know if she had hives with penicillins. Given this I advised that we would not start on omnicef as previously prescribed. I discussed that there is a small risk of reaction with this. I discussed the option of trying cipro and the risks of this medication vs waiting for sensitivities from the culture. She opted to start on medication now. We will send in Cipro. I called the pharmacy and spoke with the pharmacist to cancel the [facility]. Advised that ascending and Cipro. They canceled the [place]. I advised the patient of this again that she would be taking ciprofloxacin. We will ask that they test her urine for sensitivities.

## 2016-01-23 NOTE — Addendum Note (Signed)
Addended by: Caryl Bis, Trimaine Maser G on: 01/23/2016 01:12 PM   Modules accepted: Orders

## 2016-01-25 LAB — URINE CULTURE

## 2016-01-29 ENCOUNTER — Ambulatory Visit (INDEPENDENT_AMBULATORY_CARE_PROVIDER_SITE_OTHER): Payer: Medicare Other | Admitting: Urology

## 2016-01-29 ENCOUNTER — Encounter: Payer: Self-pay | Admitting: Urology

## 2016-01-29 VITALS — BP 166/65 | HR 59 | Ht 63.0 in | Wt 157.0 lb

## 2016-01-29 DIAGNOSIS — R3129 Other microscopic hematuria: Secondary | ICD-10-CM | POA: Diagnosis not present

## 2016-01-29 DIAGNOSIS — R310 Gross hematuria: Secondary | ICD-10-CM | POA: Diagnosis not present

## 2016-01-29 LAB — MICROSCOPIC EXAMINATION
Bacteria, UA: NONE SEEN
WBC UA: NONE SEEN /HPF (ref 0–?)

## 2016-01-29 LAB — URINALYSIS, COMPLETE
Bilirubin, UA: NEGATIVE
Glucose, UA: NEGATIVE
Leukocytes, UA: NEGATIVE
Nitrite, UA: NEGATIVE
PH UA: 5.5 (ref 5.0–7.5)
RBC, UA: NEGATIVE
Specific Gravity, UA: >1.03 — ABNORMAL HIGH (ref 1.005–1.030)
UUROB: 0.2 mg/dL (ref 0.2–1.0)

## 2016-01-29 NOTE — Progress Notes (Signed)
01/29/2016 4:00 PM   Molly Gregory [birth year] [id number]  Referring provider: Leone Haven, MD [address] [place], [place] [address]  Chief Complaint  Patient presents with  . Hematuria    new patient referred by Dr. Josephina Gip    HPI: Patient is a 65 year old Caucasian female who presents today as a referral from their PCP, Dr. Caryl Bis, for pink tinge on her toilet paper after urinating.  She first noticed this is in October of this year.  Patient doesn't have a prior history of hematuria.    She does not have a prior history of recurrent urinary tract infections, nephrolithiasis (found on recent CT), trauma to the genitourinary tract or malignancies of the genitourinary tract.    She does not have a family medical history of nephrolithiasis, malignancies of the genitourinary tract or hematuria.     Today, she is having symptoms of frequent urination and nocturia.   Her UA today demonstrates 3-10RBC's/hpf.    She is experiencing any suprapubic pain and left flank pain.  She denies any recent fevers, chills, nausea or vomiting.   She recently had a CT w/o and w on 01/22/2016 which noted nonobstructing right renal stones. No left renal stones. No ureteral or bladder stones. No hydronephrosis.  No renal cortical masses. No urothelial lesions, with limitations as described.  Additional findings include aortic atherosclerosis, cholelithiasis, small sliding hiatal hernia, mild sigmoid diverticulosis and small left uterine fibroid.  She is not a smoker.   She was exposed to secondhand smoke.  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    PMH: Past Medical History:  Diagnosis Date  . Allergy   . Anxiety   . Arthritis   . Diabetes mellitus without complication ([place])   . H/O exercise stress test    normal  . H/O psoriasis   . Head injury, closed 2013  . Hiatal hernia   . History of chicken pox   . Hx of acute bronchitis   . Hx of migraines   .  Hyperlipidemia   . Hypertension   . Irregular heart beat     Surgical History: Past Surgical History:  Procedure Laterality Date  . CARPAL TUNNEL RELEASE Bilateral    both hands, Dr. Ranae Pila  . CATARACT EXTRACTION Left   . EYE SURGERY Left    cornea transplant, Dr. Sandra Cockayne  . TRIGGER FINGER RELEASE Bilateral   . TUBAL LIGATION      Home Medications:    Medication List       Accurate as of 01/29/16  4:00 PM. Always use your most recent med list.          albuterol 108 (90 Base) MCG/ACT inhaler Commonly known as:  PROVENTIL HFA;VENTOLIN HFA Inhale 2 puffs into the lungs every 6 (six) hours as needed for wheezing or shortness of breath.   amLODipine 5 MG tablet Commonly known as:  NORVASC Take 1 tablet (5 mg total) by mouth daily.   atorvastatin 40 MG tablet Commonly known as:  LIPITOR Take 1 tablet (40 mg total) by mouth daily.   CALCIUM + D PO Take by mouth daily.   ciprofloxacin 250 MG tablet Commonly known as:  CIPRO Take 1 tablet (250 mg total) by mouth 2 (two) times daily.   citalopram 10 MG tablet Commonly known as:  CELEXA Take 1 tablet (10 mg total) by mouth daily.   gabapentin 300 MG capsule Commonly known as:  NEURONTIN TAKE 1 CAPSULE (300 MG TOTAL)  BY MOUTH NIGHTLY.   Ibuprofen 200 MG Caps Take by mouth.   losartan 100 MG tablet Commonly known as:  COZAAR TAKE 1 TABLET (100 MG TOTAL) BY MOUTH DAILY.   OTEZLA 30 MG Tabs Generic drug:  Apremilast Take by mouth 2 (two) times daily.   propranolol ER 80 MG 24 hr capsule Commonly known as:  INDERAL LA Take 1 capsule (80 mg total) by mouth daily.   psyllium 58.6 % packet Commonly known as:  METAMUCIL Take 1 packet by mouth daily.   triamcinolone cream 0.1 % Commonly known as:  KENALOG Apply 1 application topically 2 (two) times daily.   WOMENS MULTIVITAMIN PLUS PO Take by mouth.       Allergies:  Allergies  Allergen Reactions  . Penicillins     Family History: Family History   Problem Relation Age of Onset  . Hypertension Maternal Aunt   . Aneurysm Maternal Aunt   . Kidney failure Maternal Aunt   . Diabetes Maternal Grandmother   . Heart disease Maternal Grandmother   . Hypertension Maternal Grandmother   . Diabetes Paternal Grandmother   . Heart disease Paternal Grandmother   . Hypertension Paternal Grandmother   . Kidney cancer Cousin   . Bladder Cancer Neg Hx     Social History:  reports that she has never smoked. She has never used smokeless tobacco. She reports that she does not drink alcohol or use drugs.  ROS: UROLOGY Frequent Urination?: Yes Hard to postpone urination?: No Burning/pain with urination?: No Get up at night to urinate?: Yes Leakage of urine?: No Urine stream starts and stops?: No Trouble starting stream?: No Do you have to strain to urinate?: No Blood in urine?: Yes Urinary tract infection?: No Sexually transmitted disease?: No Injury to kidneys or bladder?: No Painful intercourse?: No Weak stream?: No Currently pregnant?: No Vaginal bleeding?: No Last menstrual period?: n  Gastrointestinal Nausea?: No Vomiting?: No Indigestion/heartburn?: No Diarrhea?: No Constipation?: No  Constitutional Fever: No Night sweats?: No Weight loss?: No Fatigue?: No  Skin Skin rash/lesions?: Yes Itching?: Yes  Eyes Blurred vision?: Yes Double vision?: No  Ears/Nose/Throat Sore throat?: No Sinus problems?: Yes  Hematologic/Lymphatic Swollen glands?: No Easy bruising?: No  Cardiovascular Leg swelling?: No Chest pain?: No  Respiratory Cough?: Yes Shortness of breath?: No  Endocrine Excessive thirst?: No  Musculoskeletal Back pain?: Yes Joint pain?: Yes  Neurological Headaches?: Yes Dizziness?: No  Psychologic Depression?: No Anxiety?: Yes  Physical Exam: BP (!) 166/65   Pulse (!) 59   Ht 5\' 3"  (1.6 m)   Wt 157 lb (71.2 kg)   BMI 27.81 kg/m   Constitutional: Well nourished. Alert and oriented,  No acute distress. HEENT: [place] AT, moist mucus membranes. Trachea midline, no masses. Cardiovascular: No clubbing, cyanosis, or edema. Respiratory: Normal respiratory effort, no increased work of breathing. GI: Abdomen is soft, non tender, non distended, no abdominal masses. Liver and spleen not palpable.  No hernias appreciated.  Stool sample for occult testing is not indicated.   GU: No CVA tenderness.  No bladder fullness or masses.   Atrophic external genitalia, normal pubic hair distribution, no lesions.  Normal urethral meatus, no lesions, no prolapse, no discharge.   No urethral masses, tenderness and/or tenderness. No bladder fullness, tenderness or masses. Pale vagina mucosa, poor estrogen effect, no discharge, no lesions, good pelvic support, no cystocele or rectocele noted.  No cervical motion tenderness.  Uterus is freely mobile and non-fixed.  No adnexal/parametria masses or tenderness  noted.  Anus and perineum are without rashes or lesions.    Skin: No rashes, bruises or suspicious lesions. Lymph: No cervical or inguinal adenopathy. Neurologic: Grossly intact, no focal deficits, moving all 4 extremities. Psychiatric: Normal mood and affect.  Laboratory Data: Lab Results  Component Value Date   WBC 7.6 08/27/2015   HGB 12.9 08/27/2015   HCT 39.2 08/27/2015   MCV 88.9 08/27/2015   PLT 338.0 08/27/2015    Lab Results  Component Value Date   CREATININE 0.70 01/22/2016     Lab Results  Component Value Date   HGBA1C 6.0 12/04/2015    Lab Results  Component Value Date   TSH 1.29 12/16/2012       Component Value Date/Time   CHOL 206 (H) 12/04/2015 0933   HDL 55.30 12/04/2015 0933   CHOLHDL 4 12/04/2015 0933   VLDL 16.8 12/04/2015 0933   LDLCALC 134 (H) 12/04/2015 0933    Lab Results  Component Value Date   AST 19 01/07/2016   Lab Results  Component Value Date   ALT 17 01/07/2016     Urinalysis 3-10RBC's.  See EPIC.  Pertinent Imaging: CLINICAL DATA:   Gross hematuria. Urinary frequency and urgency. Mild suprapubic pressure.  EXAM: CT ABDOMEN AND PELVIS WITHOUT AND WITH CONTRAST  TECHNIQUE: Multidetector CT imaging of the abdomen and pelvis was performed following the standard protocol before and following the bolus administration of intravenous contrast.  CONTRAST:  163mL ISOVUE-300 IOPAMIDOL (ISOVUE-300) INJECTION 61%  COMPARISON:  None.  FINDINGS: Lower chest: No significant pulmonary nodules or acute consolidative airspace disease. Calcified 4 mm right lower lobe granuloma.  Hepatobiliary: Normal liver with no liver mass. Nondistended gallbladder contains calcified gallstones measuring up to 17 mm in size, with no gallbladder wall thickening or pericholecystic fluid. No biliary ductal dilatation.  Pancreas: Normal, with no mass or duct dilation.  Spleen: Normal size. No mass.  Adrenals/Urinary Tract: Normal adrenals. Nonobstructing 4 mm and 1 mm lower right renal stones. No left renal stones. No hydronephrosis. Normal caliber ureters, with no ureteral stones. No renal cortical masses. On delayed imaging, there is no urothelial wall thickening and there are no filling defects in the opacified portions of the bilateral collecting systems or ureters, noting non opacification of the very proximal right ureter and of the entire pelvic segment of the left ureter, limiting evaluation in these locations. No bladder stones, wall thickening, masses or diverticula.  Stomach/Bowel: Small sliding hiatal hernia. Otherwise grossly normal stomach. Normal caliber small bowel with no small bowel wall thickening. Normal appendix. Mild sigmoid diverticulosis, with no large bowel wall thickening or pericolonic fat stranding.  Vascular/Lymphatic: Atherosclerotic nonaneurysmal abdominal aorta. Patent portal, splenic, hepatic and renal veins. No pathologically enlarged lymph nodes in the abdomen or pelvis.  Reproductive:  Retroverted uterus. Left uterine body 1.8 cm mass, probably a small fibroid. No adnexal mass.  Other: No pneumoperitoneum, ascites or focal fluid collection.  Musculoskeletal: No aggressive appearing focal osseous lesions. Mild thoracolumbar spondylosis.  IMPRESSION: 1. Nonobstructing right renal stones. No left renal stones. No ureteral or bladder stones. No hydronephrosis. 2. No renal cortical masses. No urothelial lesions, with limitations as described. 3. Additional findings include aortic atherosclerosis, cholelithiasis, small sliding hiatal hernia, mild sigmoid diverticulosis and small left uterine fibroid.   Electronically Signed   By: Ilona Sorrel M.D.   On: 01/23/2016 08:40   Assessment & Plan:    1. Gross hematuria  - patient having a pink tinge on toilet paper when wiping  -  3-10 RBC's seen on today's exam  - CT Urogram completed, stones seen in right kidneys  - ureters did not completely opacify-  - I explained that the study lacked the detail of excluding some urologic tumors  - discussed undergoing in office cystoscopy vs cystoscopy with bilateral retrogrades in the OR to complete the hematuria workup in addition to the imaging studies - she would like to have an in office cystoscopy with the understanding that the physician may find it necessary to pursue RTG's based on findings - she is agreeable  - I've recommended a cystoscopy. I described how this is performed, typically in an office setting with a flexible cystoscope. We described the risks, benefits, and possible side effects, the most common of which is a minor amount of blood in the urine and/or burning which usually resolves in 24 to 48 hours.    - The patient had the opportunity to ask questions which were answered. Based upon this discussion, the patient is willing to proceed. Therefore, I've ordered: a CT Urogram and cystoscopy.  - The patient will return following all of the above for discussion of  the results.     - Urinalysis, Complete  - CULTURE, URINE COMPREHENSIVE  - BUN+Creat   Return for cystoscopy.  These notes generated with voice recognition software. I apologize for typographical errors.  Zara Council, [facility] [address] [place], [place] [address] [phone]

## 2016-01-30 LAB — BUN+CREAT
BUN/Creatinine Ratio: 23 (ref 12–28)
BUN: 16 mg/dL (ref 8–27)
CREATININE: 0.7 mg/dL (ref 0.57–1.00)
GFR calc Af Amer: 104 mL/min/{1.73_m2} (ref >59)
GFR, EST NON AFRICAN AMERICAN: 90 mL/min/{1.73_m2} (ref >59)

## 2016-02-04 ENCOUNTER — Telehealth: Payer: Self-pay | Admitting: Family Medicine

## 2016-02-04 LAB — CULTURE, URINE COMPREHENSIVE

## 2016-02-04 MED ORDER — NITROFURANTOIN MONOHYD MACRO 100 MG PO CAPS: 100.0000 mg | ORAL_CAPSULE | Freq: Two times a day (BID) | ORAL | 0 refills | Status: DC

## 2016-02-04 NOTE — Telephone Encounter (Signed)
Patient notified and RX sent to pharmacy.  

## 2016-02-04 NOTE — Telephone Encounter (Signed)
-----   Message from Nori Riis, PA-C sent at 02/04/2016 11:22 AM EST ----- Patient has a +UCx.  They need to start Macrobid 100 mg,  one capsule twice daily until her cystoscopy appointment.  They also need to take a probiotic with the antibiotic course.  The dosage is listed below:  L. acidophilus and L. casei (25 x 109 CFU/day for 2 days, then 50 x 109 CFU/day for duration of the antibiotic course)

## 2016-02-15 ENCOUNTER — Ambulatory Visit (INDEPENDENT_AMBULATORY_CARE_PROVIDER_SITE_OTHER): Payer: Medicare Other | Admitting: Urology

## 2016-02-15 ENCOUNTER — Encounter: Payer: Self-pay | Admitting: Urology

## 2016-02-15 VITALS — BP 157/68 | HR 55 | Ht 64.0 in | Wt 156.8 lb

## 2016-02-15 DIAGNOSIS — R310 Gross hematuria: Secondary | ICD-10-CM | POA: Diagnosis not present

## 2016-02-15 DIAGNOSIS — N200 Calculus of kidney: Secondary | ICD-10-CM

## 2016-02-15 LAB — URINALYSIS, COMPLETE
BILIRUBIN UA: NEGATIVE
GLUCOSE, UA: NEGATIVE
KETONES UA: NEGATIVE
Leukocytes, UA: NEGATIVE
Nitrite, UA: NEGATIVE
PH UA: 7 (ref 5.0–7.5)
PROTEIN UA: NEGATIVE
SPEC GRAV UA: 1.01 (ref 1.005–1.030)
UUROB: 0.2 mg/dL (ref 0.2–1.0)

## 2016-02-15 LAB — MICROSCOPIC EXAMINATION
BACTERIA UA: NONE SEEN
WBC UA: NONE SEEN /HPF (ref 0–?)

## 2016-02-15 MED ORDER — CIPROFLOXACIN HCL 500 MG PO TABS
500.0000 mg | ORAL_TABLET | Freq: Once | ORAL | Status: AC
  Administered 2016-02-15: 500 mg via ORAL

## 2016-02-15 MED ORDER — CIPROFLOXACIN HCL 500 MG PO TABS: 500.0000 mg | ORAL_TABLET | Freq: Two times a day (BID) | ORAL | 0 refills | Status: DC

## 2016-02-15 MED ORDER — LIDOCAINE HCL 2 % EX GEL
1.0000 "application " | Freq: Once | CUTANEOUS | Status: AC
  Administered 2016-02-15: 1 via URETHRAL

## 2016-02-15 NOTE — Progress Notes (Signed)
   02/15/16  CC:  Chief Complaint  Patient presents with  . Cysto    gross hematuria    HPI:  The patient returns today for completion of her gross hematuria workup. CT urogram was positive for 3 mm and one punctate right lower pole stones. No other concerning areas were identified.  Blood pressure (!) 157/68, pulse (!) 55, height 5\' 4"  (1.626 m), weight 156 lb 12.8 oz (71.1 kg). NED. A&Ox3.   No respiratory distress   Abd soft, NT, ND Normal external genitalia with patent urethral meatus  Cystoscopy Procedure Note  Patient identification was confirmed, informed consent was obtained, and patient was prepped using Betadine solution.  Lidocaine jelly was administered per urethral meatus.    Preoperative abx where received prior to procedure.    Procedure: - Flexible cystoscope introduced, without any difficulty.   - Thorough search of the bladder revealed:    normal urethral meatus    normal urothelium    no stones    no ulcers     no tumors    no urethral polyps    no trabeculation  - Ureteral orifices were normal in position and appearance.  Post-Procedure: - Patient tolerated the procedure well  Assessment/ Plan:  1. Gross hematuria -negative work up except for small nonobstructing stones. Follow up in one year for repeat urinalysis  2. Nephrolithiasis -KUB to monitor size  Nickie Retort, MD

## 2016-02-18 ENCOUNTER — Other Ambulatory Visit: Payer: Self-pay | Admitting: Internal Medicine

## 2016-02-18 DIAGNOSIS — J209 Acute bronchitis, unspecified: Secondary | ICD-10-CM

## 2016-02-18 NOTE — Telephone Encounter (Signed)
Last filled 02/16/15 #1 0rf

## 2016-02-18 NOTE — Telephone Encounter (Signed)
Please contact the patient to see what she uses this medication for. It appears that it was prescribed a year ago when she had bronchitis. Thanks.

## 2016-02-19 NOTE — Telephone Encounter (Signed)
Patient states she has congestion and a cough, informed patient she would most likely be evaluated. Scheduled patient for tomorrow at 10:30

## 2016-02-20 ENCOUNTER — Encounter: Payer: Self-pay | Admitting: Family Medicine

## 2016-02-20 ENCOUNTER — Ambulatory Visit (INDEPENDENT_AMBULATORY_CARE_PROVIDER_SITE_OTHER): Payer: Medicare Other

## 2016-02-20 ENCOUNTER — Ambulatory Visit (INDEPENDENT_AMBULATORY_CARE_PROVIDER_SITE_OTHER): Payer: Medicare Other | Admitting: Family Medicine

## 2016-02-20 VITALS — BP 154/80 | HR 63 | Temp 98.7°F | Wt 156.6 lb

## 2016-02-20 DIAGNOSIS — J209 Acute bronchitis, unspecified: Secondary | ICD-10-CM

## 2016-02-20 DIAGNOSIS — I10 Essential (primary) hypertension: Secondary | ICD-10-CM

## 2016-02-20 DIAGNOSIS — R0600 Dyspnea, unspecified: Secondary | ICD-10-CM | POA: Diagnosis not present

## 2016-02-20 MED ORDER — ALBUTEROL SULFATE (2.5 MG/3ML) 0.083% IN NEBU
2.5000 mg | INHALATION_SOLUTION | Freq: Once | RESPIRATORY_TRACT | Status: AC
  Administered 2016-02-20: 2.5 mg via RESPIRATORY_TRACT

## 2016-02-20 MED ORDER — ALBUTEROL SULFATE HFA 108 (90 BASE) MCG/ACT IN AERS: 2.0000 | INHALATION_SPRAY | Freq: Four times a day (QID) | RESPIRATORY_TRACT | 0 refills | Status: DC | PRN

## 2016-02-20 MED ORDER — DOXYCYCLINE HYCLATE 100 MG PO TABS: 100.0000 mg | ORAL_TABLET | Freq: Two times a day (BID) | ORAL | 0 refills | Status: DC

## 2016-02-20 MED ORDER — PREDNISONE 20 MG PO TABS: 40.0000 mg | ORAL_TABLET | Freq: Every day | ORAL | 0 refills | Status: DC

## 2016-02-20 NOTE — Progress Notes (Signed)
Pre visit review using our clinic review tool, if applicable. No additional management support is needed unless otherwise documented below in the visit note. 

## 2016-02-20 NOTE — Progress Notes (Signed)
[name], MD Phone: [phone]  Molly Gregory is a 65 y.o. female who presents today for same-day visit.  Patient notes several weeks up to a month of symptoms. Cough and congestion. Notes maxillary sinuses are congested. Notes she had been blowing some blood out of her nose though this is clear now. Coughing up white mucus alternating with yellow and green mucus. Notes some hoarseness. No ear fullness. Notes she has been wheezing some and had some mild trouble breathing with the wheezing. No fevers.  PMH: nonsmoker, though has passive smoke exposure with her husband   ROS see history of present illness  Objective  Physical Exam Vitals:   02/20/16 1020  BP: (!) 154/80  Pulse: 63  Temp: 98.7 F (37.1 C)    BP Readings from Last 3 Encounters:  02/20/16 (!) 154/80  02/15/16 (!) 157/68  01/29/16 (!) 166/65   Wt Readings from Last 3 Encounters:  02/20/16 156 lb 9.6 oz (71 kg)  02/15/16 156 lb 12.8 oz (71.1 kg)  01/29/16 157 lb (71.2 kg)    Physical Exam  Constitutional: No distress.  HENT:  Head: Normocephalic and atraumatic.  Mouth/Throat: Oropharynx is clear and moist. No oropharyngeal exudate.  Eyes: Conjunctivae are normal. Pupils are equal, round, and reactive to light.  Neck: Neck supple.  Cardiovascular: Normal rate, regular rhythm and normal heart sounds.   Pulmonary/Chest: Effort normal. No respiratory distress. She has wheezes (mild inspiratory and expiratory wheezes). She has no rales.  Mild decreased air movement bilaterally  Lymphadenopathy:    She has no cervical adenopathy.  Skin: She is not diaphoretic.   Patient was given an albuterol nebulizer in the office. She had improvement of her breath sounds following this. Noted improvement in some of her symptoms as well. No wheezing after this. Had improved air movement.  Assessment/Plan: Please see individual problem list.  Acute bronchitis Patient's symptoms most consistent with bronchitis  in conjunction with possible reactive airway issues given wheezing and decreased air movement. She was given an albuterol nebulizer in the office with good benefit. We will treat with albuterol inhaler, prednisone, and doxycycline at home. We'll obtain a chest x-ray. She will continue to monitor and follow up early next week for recheck. She is given return precautions.  Essential hypertension, benign BP is back to being elevated. We will have her increase her amlodipine to 10 mg. She'll monitor her blood pressure at home. If develops lightheadedness she'll let us know.   Orders Placed This Encounter  Procedures  . DG Chest 2 View    Standing Status:   Future    Number of Occurrences:   1    Standing Expiration Date:   04/22/2017    Order Specific Question:   Reason for Exam (SYMPTOM  OR DIAGNOSIS REQUIRED)    Answer:   cough, wheezing, decreased air movement, mild dyspnea    Order Specific Question:   Preferred imaging location?    Answer:   [facility] ordered this encounter  Medications  . albuterol (PROVENTIL HFA;VENTOLIN HFA) 108 (90 Base) MCG/ACT inhaler    Sig: Inhale 2 puffs into the lungs every 6 (six) hours as needed for wheezing or shortness of breath.    Dispense:  1 Inhaler    Refill:  0  . predniSONE (DELTASONE) 20 MG tablet    Sig: Take 2 tablets (40 mg total) by mouth daily with breakfast.    Dispense:  10 tablet    Refill:  0  . doxycycline (VIBRA-TABS) 100 MG tablet    Sig: Take 1 tablet (100 mg total) by mouth 2 (two) times daily.    Dispense:  14 tablet    Refill:  0  . albuterol (PROVENTIL) (2.5 MG/3ML) 0.083% nebulizer solution 2.5 mg    [name], MD [place]

## 2016-02-20 NOTE — Assessment & Plan Note (Addendum)
Patient's symptoms most consistent with bronchitis in conjunction with possible reactive airway issues given wheezing and decreased air movement. She was given an albuterol nebulizer in the office with good benefit. We will treat with albuterol inhaler, prednisone, and doxycycline at home. We'll obtain a chest x-ray. She will continue to monitor and follow up early next week for recheck. She is given return precautions.

## 2016-02-20 NOTE — Patient Instructions (Addendum)
Nice to see you. You likely have bronchitis. We will obtain a chest x-ray to evaluate for pneumonia. We will start you on prednisone and doxycycline to treat this. We will have you use your albuterol inhaler every 6 hours for the next 2 days and then as needed. Your blood pressure is elevated. I would like for you to increase your amlodipine to 10 mg daily. You may take two 5 mg tablets until you runs out of your current supply and then we can send this in for you. If you develop cough productive of blood, fevers, shortness of breath, or any new or changing symptoms please seek medical attention immediately.

## 2016-02-20 NOTE — Assessment & Plan Note (Signed)
BP is back to being elevated. We will have her increase her amlodipine to 10 mg. She'll monitor her blood pressure at home. If develops lightheadedness she'll let us know.

## 2016-02-21 ENCOUNTER — Telehealth: Payer: Self-pay | Admitting: Family Medicine

## 2016-02-21 ENCOUNTER — Other Ambulatory Visit: Payer: Self-pay

## 2016-02-21 DIAGNOSIS — Z1231 Encounter for screening mammogram for malignant neoplasm of breast: Secondary | ICD-10-CM

## 2016-02-21 NOTE — Telephone Encounter (Signed)
See other message

## 2016-02-21 NOTE — Telephone Encounter (Signed)
Pt called back returning your call. Thank you!  Call pt @ [phone]

## 2016-02-29 ENCOUNTER — Other Ambulatory Visit: Payer: Self-pay | Admitting: Family Medicine

## 2016-03-02 ENCOUNTER — Other Ambulatory Visit: Payer: Self-pay | Admitting: Family Medicine

## 2016-03-07 ENCOUNTER — Encounter: Payer: Self-pay | Admitting: Family Medicine

## 2016-03-07 ENCOUNTER — Ambulatory Visit (INDEPENDENT_AMBULATORY_CARE_PROVIDER_SITE_OTHER): Payer: Medicare Other | Admitting: Family Medicine

## 2016-03-07 VITALS — BP 116/58 | HR 58 | Temp 97.9°F | Wt 158.4 lb

## 2016-03-07 DIAGNOSIS — R310 Gross hematuria: Secondary | ICD-10-CM | POA: Diagnosis not present

## 2016-03-07 DIAGNOSIS — H1850 Unspecified hereditary corneal dystrophies: Secondary | ICD-10-CM

## 2016-03-07 DIAGNOSIS — H18509 Unspecified hereditary corneal dystrophies, unspecified eye: Secondary | ICD-10-CM | POA: Insufficient documentation

## 2016-03-07 DIAGNOSIS — J209 Acute bronchitis, unspecified: Secondary | ICD-10-CM

## 2016-03-07 DIAGNOSIS — R58 Hemorrhage, not elsewhere classified: Secondary | ICD-10-CM | POA: Diagnosis not present

## 2016-03-07 NOTE — Assessment & Plan Note (Addendum)
Had a thorough workup through urology. Possibly related to right renal stones. Continues to have issues with this. Discussed that it does not sound as though there is vaginal bleeding though discussed that I would feel more comfortable having her evaluated by gynecology to ensure that there is no additional cause for this. Referral will be placed.

## 2016-03-07 NOTE — Patient Instructions (Signed)
Nice to see you. Please continue to monitor your congestion. We are going to get you to see gynecology to ensure that there is no gynecologic cause for your bleeding.

## 2016-03-07 NOTE — Assessment & Plan Note (Signed)
Patient reports corneal dystrophy and cataracts. Cataract noted in right eye. Discussed continuing to follow-up with ophthalmology for this.

## 2016-03-07 NOTE — Progress Notes (Signed)
Pre visit review using our clinic review tool, if applicable. No additional management support is needed unless otherwise documented below in the visit note. 

## 2016-03-07 NOTE — Assessment & Plan Note (Signed)
Improved. Benign exam. Continue to monitor for recurrence.

## 2016-03-07 NOTE — Progress Notes (Signed)
  [name], MD Phone: [phone]  Molly Gregory is a 66 y.o. female who presents today for follow-up.  Patient notes her congestion from her bronchitis has improved. Coughing is improving as well. No trouble breathing. Phlegm is much less. Overall feels improved.  Hematuria: Patient has had workup through urology. Showed nonobstructing stones in the right kidney. Still has this occasionally 1-2 times a week only when she urinates. Gets a small pink tinge to the tissue when she wipes her urethra. She notes no vaginal blood or bleeding between times of urination. No discoloration to the urine in the toilet. Has not seen gynecology anytime recently. Did have a benign Pap smear recently.  Patient does have corneal dystrophy. Followed by ophthalmology here and at [facility]. Is going to see them next week for consideration of surgery for removal of a cataract. She just wanted me to be aware of this.  PMH: nonsmoker.   ROS see history of present illness  Objective  Physical Exam Vitals:   03/07/16 0950  BP: (!) 116/58  Pulse: (!) 58  Temp: 97.9 F (36.6 C)    BP Readings from Last 3 Encounters:  03/07/16 (!) 116/58  02/20/16 (!) 154/80  02/15/16 (!) 157/68   Wt Readings from Last 3 Encounters:  03/07/16 158 lb 6.4 oz (71.8 kg)  02/20/16 156 lb 9.6 oz (71 kg)  02/15/16 156 lb 12.8 oz (71.1 kg)    Physical Exam  Constitutional: No distress.  HENT:  Head: Normocephalic and atraumatic.  Mouth/Throat: Oropharynx is clear and moist. No oropharyngeal exudate.  Eyes: Conjunctivae are normal. Pupils are equal, round, and reactive to light.  Cataract apparent in right eye  Cardiovascular: Normal rate, regular rhythm and normal heart sounds.   Pulmonary/Chest: Effort normal and breath sounds normal.  Abdominal: Soft. Bowel sounds are normal. She exhibits no distension. There is no tenderness. There is no rebound and no guarding.  Skin: She is not diaphoretic.      Assessment/Plan: Please see individual problem list.  Acute bronchitis Improved. Benign exam. Continue to monitor for recurrence.  Hematuria Had a thorough workup through urology. Possibly related to right renal stones. Continues to have issues with this. Discussed that it does not sound as though there is vaginal bleeding though discussed that I would feel more comfortable having her evaluated by gynecology to ensure that there is no additional cause for this. Referral will be placed.   Corneal dystrophy Patient reports corneal dystrophy and cataracts. Cataract noted in right eye. Discussed continuing to follow-up with ophthalmology for this.   Orders Placed This Encounter  Procedures  . Ambulatory referral to Gynecology    Referral Priority:   Routine    Referral Type:   Consultation    Referral Reason:   Specialty Services Required    Requested Specialty:   Gynecology    Number of Visits Requested:   [place], MD [place]

## 2016-03-10 DIAGNOSIS — Z880 Allergy status to penicillin: Secondary | ICD-10-CM | POA: Diagnosis not present

## 2016-03-10 DIAGNOSIS — F419 Anxiety disorder, unspecified: Secondary | ICD-10-CM | POA: Diagnosis not present

## 2016-03-10 DIAGNOSIS — H53453 Other localized visual field defect, bilateral: Secondary | ICD-10-CM | POA: Diagnosis not present

## 2016-03-10 DIAGNOSIS — Z9842 Cataract extraction status, left eye: Secondary | ICD-10-CM | POA: Diagnosis not present

## 2016-03-10 DIAGNOSIS — E119 Type 2 diabetes mellitus without complications: Secondary | ICD-10-CM | POA: Diagnosis not present

## 2016-03-10 DIAGNOSIS — Z947 Corneal transplant status: Secondary | ICD-10-CM | POA: Diagnosis not present

## 2016-03-10 DIAGNOSIS — H1851 Endothelial corneal dystrophy: Secondary | ICD-10-CM | POA: Diagnosis not present

## 2016-03-10 DIAGNOSIS — H25811 Combined forms of age-related cataract, right eye: Secondary | ICD-10-CM | POA: Diagnosis not present

## 2016-03-10 DIAGNOSIS — I10 Essential (primary) hypertension: Secondary | ICD-10-CM | POA: Diagnosis not present

## 2016-03-10 DIAGNOSIS — G629 Polyneuropathy, unspecified: Secondary | ICD-10-CM | POA: Diagnosis not present

## 2016-03-10 DIAGNOSIS — Z961 Presence of intraocular lens: Secondary | ICD-10-CM | POA: Diagnosis not present

## 2016-03-10 DIAGNOSIS — H538 Other visual disturbances: Secondary | ICD-10-CM | POA: Diagnosis not present

## 2016-03-10 DIAGNOSIS — Z87442 Personal history of urinary calculi: Secondary | ICD-10-CM | POA: Diagnosis not present

## 2016-03-10 DIAGNOSIS — H25813 Combined forms of age-related cataract, bilateral: Secondary | ICD-10-CM | POA: Diagnosis not present

## 2016-03-10 DIAGNOSIS — Z9849 Cataract extraction status, unspecified eye: Secondary | ICD-10-CM | POA: Diagnosis not present

## 2016-03-18 DIAGNOSIS — Z1231 Encounter for screening mammogram for malignant neoplasm of breast: Secondary | ICD-10-CM | POA: Diagnosis not present

## 2016-03-18 LAB — HM MAMMOGRAPHY

## 2016-03-21 ENCOUNTER — Encounter: Payer: Self-pay | Admitting: Family Medicine

## 2016-03-24 ENCOUNTER — Other Ambulatory Visit: Payer: Self-pay | Admitting: Family Medicine

## 2016-04-15 DIAGNOSIS — E119 Type 2 diabetes mellitus without complications: Secondary | ICD-10-CM | POA: Diagnosis not present

## 2016-04-15 DIAGNOSIS — I10 Essential (primary) hypertension: Secondary | ICD-10-CM | POA: Diagnosis not present

## 2016-04-15 DIAGNOSIS — Z961 Presence of intraocular lens: Secondary | ICD-10-CM | POA: Insufficient documentation

## 2016-04-15 DIAGNOSIS — H25811 Combined forms of age-related cataract, right eye: Secondary | ICD-10-CM | POA: Diagnosis not present

## 2016-04-15 DIAGNOSIS — H1851 Endothelial corneal dystrophy: Secondary | ICD-10-CM | POA: Diagnosis not present

## 2016-04-15 DIAGNOSIS — R0789 Other chest pain: Secondary | ICD-10-CM | POA: Diagnosis not present

## 2016-04-16 DIAGNOSIS — Z48810 Encounter for surgical aftercare following surgery on the sense organs: Secondary | ICD-10-CM | POA: Diagnosis not present

## 2016-04-16 DIAGNOSIS — Z961 Presence of intraocular lens: Secondary | ICD-10-CM | POA: Diagnosis not present

## 2016-04-16 DIAGNOSIS — Z947 Corneal transplant status: Secondary | ICD-10-CM | POA: Diagnosis not present

## 2016-04-16 DIAGNOSIS — Z9842 Cataract extraction status, left eye: Secondary | ICD-10-CM | POA: Diagnosis not present

## 2016-04-23 DIAGNOSIS — E663 Overweight: Secondary | ICD-10-CM | POA: Diagnosis not present

## 2016-04-23 DIAGNOSIS — Z9842 Cataract extraction status, left eye: Secondary | ICD-10-CM | POA: Diagnosis not present

## 2016-04-23 DIAGNOSIS — Z880 Allergy status to penicillin: Secondary | ICD-10-CM | POA: Diagnosis not present

## 2016-04-23 DIAGNOSIS — Z947 Corneal transplant status: Secondary | ICD-10-CM | POA: Diagnosis not present

## 2016-04-23 DIAGNOSIS — Z79899 Other long term (current) drug therapy: Secondary | ICD-10-CM | POA: Diagnosis not present

## 2016-04-23 DIAGNOSIS — Z48810 Encounter for surgical aftercare following surgery on the sense organs: Secondary | ICD-10-CM | POA: Diagnosis not present

## 2016-04-23 DIAGNOSIS — Z961 Presence of intraocular lens: Secondary | ICD-10-CM | POA: Diagnosis not present

## 2016-04-23 DIAGNOSIS — Z6825 Body mass index (BMI) 25.0-25.9, adult: Secondary | ICD-10-CM | POA: Diagnosis not present

## 2016-04-23 DIAGNOSIS — E1142 Type 2 diabetes mellitus with diabetic polyneuropathy: Secondary | ICD-10-CM | POA: Diagnosis not present

## 2016-04-23 DIAGNOSIS — I10 Essential (primary) hypertension: Secondary | ICD-10-CM | POA: Diagnosis not present

## 2016-04-23 DIAGNOSIS — E1136 Type 2 diabetes mellitus with diabetic cataract: Secondary | ICD-10-CM | POA: Diagnosis not present

## 2016-05-12 DIAGNOSIS — Z961 Presence of intraocular lens: Secondary | ICD-10-CM | POA: Diagnosis not present

## 2016-05-12 DIAGNOSIS — Z79899 Other long term (current) drug therapy: Secondary | ICD-10-CM | POA: Diagnosis not present

## 2016-05-12 DIAGNOSIS — I10 Essential (primary) hypertension: Secondary | ICD-10-CM | POA: Diagnosis not present

## 2016-05-12 DIAGNOSIS — E1142 Type 2 diabetes mellitus with diabetic polyneuropathy: Secondary | ICD-10-CM | POA: Diagnosis not present

## 2016-05-12 DIAGNOSIS — Z48810 Encounter for surgical aftercare following surgery on the sense organs: Secondary | ICD-10-CM | POA: Diagnosis not present

## 2016-05-12 DIAGNOSIS — Z947 Corneal transplant status: Secondary | ICD-10-CM | POA: Diagnosis not present

## 2016-05-12 DIAGNOSIS — Z880 Allergy status to penicillin: Secondary | ICD-10-CM | POA: Diagnosis not present

## 2016-05-12 DIAGNOSIS — Z9842 Cataract extraction status, left eye: Secondary | ICD-10-CM | POA: Diagnosis not present

## 2016-06-04 ENCOUNTER — Ambulatory Visit
Admission: RE | Admit: 2016-06-04 | Discharge: 2016-06-04 | Disposition: A | Discharge status: Home / Self Care | Payer: Medicare Other | Source: Ambulatory Visit | Attending: Urology | Admitting: Urology

## 2016-06-04 ENCOUNTER — Telehealth: Payer: Self-pay | Admitting: *Deleted

## 2016-06-04 DIAGNOSIS — N200 Calculus of kidney: Secondary | ICD-10-CM | POA: Insufficient documentation

## 2016-06-04 DIAGNOSIS — K8020 Calculus of gallbladder without cholecystitis without obstruction: Secondary | ICD-10-CM | POA: Insufficient documentation

## 2016-06-04 NOTE — Telephone Encounter (Signed)
Pt has requested to be seen at the [facility] clinic by Dr Leafy Ro  Pt contact [phone]

## 2016-06-16 ENCOUNTER — Other Ambulatory Visit: Payer: Self-pay | Admitting: Family Medicine

## 2016-06-18 ENCOUNTER — Encounter: Payer: Medicare Other | Admitting: Obstetrics and Gynecology

## 2016-07-01 DIAGNOSIS — Z86018 Personal history of other benign neoplasm: Secondary | ICD-10-CM | POA: Diagnosis not present

## 2016-07-01 DIAGNOSIS — N950 Postmenopausal bleeding: Secondary | ICD-10-CM | POA: Diagnosis not present

## 2016-07-07 DIAGNOSIS — H1851 Endothelial corneal dystrophy: Secondary | ICD-10-CM | POA: Diagnosis not present

## 2016-07-07 DIAGNOSIS — Z961 Presence of intraocular lens: Secondary | ICD-10-CM | POA: Diagnosis not present

## 2016-07-07 DIAGNOSIS — Z947 Corneal transplant status: Secondary | ICD-10-CM | POA: Diagnosis not present

## 2016-07-07 DIAGNOSIS — Z9842 Cataract extraction status, left eye: Secondary | ICD-10-CM | POA: Diagnosis not present

## 2016-07-07 DIAGNOSIS — Z79899 Other long term (current) drug therapy: Secondary | ICD-10-CM | POA: Diagnosis not present

## 2016-07-07 DIAGNOSIS — H35351 Cystoid macular degeneration, right eye: Secondary | ICD-10-CM | POA: Diagnosis not present

## 2016-07-07 DIAGNOSIS — E1142 Type 2 diabetes mellitus with diabetic polyneuropathy: Secondary | ICD-10-CM | POA: Diagnosis not present

## 2016-07-07 DIAGNOSIS — Z880 Allergy status to penicillin: Secondary | ICD-10-CM | POA: Diagnosis not present

## 2016-07-07 DIAGNOSIS — Z48810 Encounter for surgical aftercare following surgery on the sense organs: Secondary | ICD-10-CM | POA: Diagnosis not present

## 2016-07-07 DIAGNOSIS — Z9841 Cataract extraction status, right eye: Secondary | ICD-10-CM | POA: Diagnosis not present

## 2016-07-07 DIAGNOSIS — I10 Essential (primary) hypertension: Secondary | ICD-10-CM | POA: Diagnosis not present

## 2016-07-10 DIAGNOSIS — D485 Neoplasm of uncertain behavior of skin: Secondary | ICD-10-CM | POA: Diagnosis not present

## 2016-07-10 DIAGNOSIS — L820 Inflamed seborrheic keratosis: Secondary | ICD-10-CM | POA: Diagnosis not present

## 2016-07-10 DIAGNOSIS — L400 Psoriasis vulgaris: Secondary | ICD-10-CM | POA: Diagnosis not present

## 2016-07-10 DIAGNOSIS — Z85828 Personal history of other malignant neoplasm of skin: Secondary | ICD-10-CM | POA: Diagnosis not present

## 2016-07-10 DIAGNOSIS — D225 Melanocytic nevi of trunk: Secondary | ICD-10-CM | POA: Diagnosis not present

## 2016-07-10 DIAGNOSIS — D2261 Melanocytic nevi of right upper limb, including shoulder: Secondary | ICD-10-CM | POA: Diagnosis not present

## 2016-07-20 ENCOUNTER — Encounter: Payer: Self-pay | Admitting: Emergency Medicine

## 2016-07-20 ENCOUNTER — Emergency Department: Payer: Medicare Other

## 2016-07-20 ENCOUNTER — Emergency Department
Admission: EM | Admit: 2016-07-20 | Discharge: 2016-07-20 | Disposition: A | Discharge status: Home / Self Care | Payer: Medicare Other | Attending: Emergency Medicine | Admitting: Emergency Medicine

## 2016-07-20 DIAGNOSIS — R079 Chest pain, unspecified: Secondary | ICD-10-CM | POA: Insufficient documentation

## 2016-07-20 DIAGNOSIS — I10 Essential (primary) hypertension: Secondary | ICD-10-CM | POA: Diagnosis not present

## 2016-07-20 DIAGNOSIS — R1033 Periumbilical pain: Secondary | ICD-10-CM | POA: Diagnosis not present

## 2016-07-20 DIAGNOSIS — Z79899 Other long term (current) drug therapy: Secondary | ICD-10-CM | POA: Diagnosis not present

## 2016-07-20 DIAGNOSIS — E119 Type 2 diabetes mellitus without complications: Secondary | ICD-10-CM | POA: Diagnosis not present

## 2016-07-20 DIAGNOSIS — R109 Unspecified abdominal pain: Secondary | ICD-10-CM | POA: Insufficient documentation

## 2016-07-20 LAB — BASIC METABOLIC PANEL
ANION GAP: 6 (ref 5–15)
BUN: 14 mg/dL (ref 6–20)
CALCIUM: 9.3 mg/dL (ref 8.9–10.3)
CHLORIDE: 107 mmol/L (ref 101–111)
CO2: 28 mmol/L (ref 22–32)
Creatinine, Ser: 0.77 mg/dL (ref 0.44–1.00)
GFR calc non Af Amer: >60 mL/min (ref >60)
GLUCOSE: 107 mg/dL — AB (ref 65–99)
Potassium: 3.6 mmol/L (ref 3.5–5.1)
Sodium: 141 mmol/L (ref 135–145)

## 2016-07-20 LAB — HEPATIC FUNCTION PANEL
ALBUMIN: 3.9 g/dL (ref 3.5–5.0)
ALT: 48 U/L (ref 14–54)
AST: 68 U/L — AB (ref 15–41)
Alkaline Phosphatase: 72 U/L (ref 38–126)
BILIRUBIN DIRECT: 0.1 mg/dL (ref 0.1–0.5)
BILIRUBIN TOTAL: 0.9 mg/dL (ref 0.3–1.2)
Indirect Bilirubin: 0.8 mg/dL (ref 0.3–0.9)
Total Protein: 7 g/dL (ref 6.5–8.1)

## 2016-07-20 LAB — CBC
HCT: 36.2 % (ref 35.0–47.0)
HEMOGLOBIN: 12.4 g/dL (ref 12.0–16.0)
MCH: 29.5 pg (ref 26.0–34.0)
MCHC: 34.2 g/dL (ref 32.0–36.0)
MCV: 86.2 fL (ref 80.0–100.0)
Platelets: 273 10*3/uL (ref 150–440)
RBC: 4.2 MIL/uL (ref 3.80–5.20)
RDW: 13.6 % (ref 11.5–14.5)
WBC: 7.8 10*3/uL (ref 3.6–11.0)

## 2016-07-20 LAB — TROPONIN I: Troponin I: <0.03 ng/mL (ref <0.03)

## 2016-07-20 LAB — LIPASE, BLOOD: Lipase: 46 U/L (ref 11–51)

## 2016-07-20 MED ORDER — IOPAMIDOL (ISOVUE-370) INJECTION 76%
100.0000 mL | Freq: Once | INTRAVENOUS | Status: AC | PRN
  Administered 2016-07-20: 100 mL via INTRAVENOUS

## 2016-07-20 NOTE — ED Triage Notes (Signed)
Pt comes into the ED via POV c/o chest and abdominal pain that started at 2:30 today.  Patient states the pain started at her navel and went straight up into her chest.  Patient denies n/v, shortness of breath, or dizziness. Patient in NAD at this time and states the pain has no subsided for the most part.  Patient has h/o hiatal hernia.

## 2016-07-20 NOTE — Discharge Instructions (Signed)

## 2016-07-20 NOTE — ED Provider Notes (Signed)
[facility] Emergency Department Provider Note  ____________________________________________  Time seen: Approximately 4:31 PM  I have reviewed the triage vital signs and the nursing notes.   HISTORY  Chief Complaint Abdominal Pain and Chest Pain   HPI Molly Gregory is a 66 y.o. female with a history of hypertension, hyperlipidemia, A. fib, diabetes who presents for evaluation of chest and abdominal pain. Patient reports sudden onset of stabbing pain located around her navel which moved up to her chest into her back. She reports that the pain was extremely intense and brought her down to tears. She reports that now the pain is dull and soreness located in the left chest and mild. No diaphoresis, SOB, N/V/D, syncope, paresthesias or weakness of extremities. Patient denies ever having similar pain in her life. She has a history of aortic aneurysm in her aunt.Patient is not a smoker. No history of cardiac disease. Patient had a nuclear medicine stress test 7 months ago which was negative  Past Medical History:  Diagnosis Date  . Allergy   . Anxiety   . Arthritis   . Diabetes mellitus without complication ([place])   . H/O exercise stress test    normal  . H/O psoriasis   . Head injury, closed 2013  . Hiatal hernia   . History of chicken pox   . Hx of acute bronchitis   . Hx of migraines   . Hyperlipidemia   . Hypertension   . Irregular heart beat     Patient Active Problem List   Diagnosis Date Noted  . Corneal dystrophy 03/07/2016  . Acute upper respiratory infection 01/08/2016  . Atypical chest pain 12/04/2015  . Hematuria 12/04/2015  . Chronic headache 12/04/2015  . Acute recurrent maxillary sinusitis 08/27/2015  . Hereditary and idiopathic peripheral neuropathy 01/15/2015  . Squamous cell carcinoma in situ of skin of right lower leg 07/03/2014  . Overweight (BMI 25.0-29.9) 07/03/2014  . Acute bronchitis 02/28/2014  . Bereavement 12/14/2013   . Family history of aortic aneurysm 12/14/2013  . Arthralgia 12/14/2013  . Special screening for malignant neoplasms, colon 12/14/2013  . Advance care planning 12/14/2013  . Left sciatic nerve pain 06/14/2013  . Essential hypertension, benign 12/16/2012  . Psoriasis 12/16/2012    Past Surgical History:  Procedure Laterality Date  . CARPAL TUNNEL RELEASE Bilateral    both hands, Dr. Ranae Pila  . CATARACT EXTRACTION Left   . EYE SURGERY Left    cornea transplant, Dr. Sandra Cockayne  . TRIGGER FINGER RELEASE Bilateral   . TUBAL LIGATION      Prior to Admission medications   Medication Sig Start Date End Date Taking? Authorizing Provider  albuterol (PROVENTIL HFA;VENTOLIN HFA) 108 (90 Base) MCG/ACT inhaler Inhale 2 puffs into the lungs every 6 (six) hours as needed for wheezing or shortness of breath. 02/20/16   Leone Haven, MD  amLODipine (NORVASC) 10 MG tablet TAKE 1 TABLET BY MOUTH EVERY DAY 02/29/16   Leone Haven, MD  Apremilast (OTEZLA) 30 MG TABS Take by mouth 2 (two) times daily.    [provider]  atorvastatin (LIPITOR) 40 MG tablet Take 1 tablet (40 mg total) by mouth daily. 12/05/15   Leone Haven, MD  Calcium Carbonate-Vitamin D (CALCIUM + D PO) Take by mouth daily.    [provider]  citalopram (CELEXA) 10 MG tablet TAKE 1 TABLET (10 MG TOTAL) BY MOUTH DAILY. 03/25/16   Leone Haven, MD  gabapentin (NEURONTIN) 300 MG capsule  TAKE 1 CAPSULE (300 MG TOTAL) BY MOUTH NIGHTLY. 07/20/15   [provider]  Ibuprofen 200 MG CAPS Take by mouth.    [provider]  losartan (COZAAR) 100 MG tablet TAKE 1 TABLET (100 MG TOTAL) BY MOUTH DAILY. 06/16/16   Leone Haven, MD  Multiple Vitamins-Minerals (WOMENS MULTIVITAMIN PLUS PO) Take by mouth.    [provider]  propranolol ER (INDERAL LA) 80 MG 24 hr capsule TAKE 1 CAPSULE (80 MG TOTAL) BY MOUTH DAILY. 06/16/16   Leone Haven, MD  psyllium (METAMUCIL) 58.6 % packet  Take 1 packet by mouth daily.    [provider]  triamcinolone cream (KENALOG) 0.1 % Apply 1 application topically 2 (two) times daily.    [provider]    Allergies Penicillins  Family History  Problem Relation Age of Onset  . Hypertension Maternal Aunt   . Aneurysm Maternal Aunt   . Kidney failure Maternal Aunt   . Diabetes Maternal Grandmother   . Heart disease Maternal Grandmother   . Hypertension Maternal Grandmother   . Diabetes Paternal Grandmother   . Heart disease Paternal Grandmother   . Hypertension Paternal Grandmother   . Kidney cancer Cousin   . Bladder Cancer Neg Hx     Social History Social History  Substance Use Topics  . Smoking status: Never Smoker  . Smokeless tobacco: Never Used  . Alcohol use No    Review of Systems  Constitutional: Negative for fever. Eyes: Negative for visual changes. ENT: Negative for sore throat. Neck: No neck pain  Cardiovascular: + chest pain. Respiratory: Negative for shortness of breath. Gastrointestinal: + abdominal pain. No vomiting or diarrhea. Genitourinary: Negative for dysuria. Musculoskeletal: Negative for back pain. Skin: Negative for rash. Neurological: Negative for headaches, weakness or numbness. Psych: No SI or HI  ____________________________________________   PHYSICAL EXAM:  VITAL SIGNS: ED Triage Vitals  Enc Vitals Group     BP 07/20/16 1516 (!) 141/46     Pulse Rate 07/20/16 1516 (!) 59     Resp 07/20/16 1516 16     Temp 07/20/16 1516 98.6 F (37 C)     Temp Source 07/20/16 1516 Oral     SpO2 07/20/16 1516 99 %     Weight 07/20/16 1516 157 lb (71.2 kg)     Height 07/20/16 1516 5\' 4"  (1.626 m)     Head Circumference --      Peak Flow --      Pain Score 07/20/16 1515 1     Pain Loc --      Pain Edu? --      Excl. in [place]? --     Constitutional: Alert and oriented. Well appearing and in no apparent distress. HEENT:      Head: Normocephalic and atraumatic.          Eyes: Conjunctivae are normal. Sclera is non-icteric.       Mouth/Throat: Mucous membranes are moist.       Neck: Supple with no signs of meningismus. Cardiovascular: Regular rate and rhythm. No murmurs, gallops, or rubs. 2+ symmetrical distal pulses are present in all extremities. No JVD. Respiratory: Normal respiratory effort. Lungs are clear to auscultation bilaterally. No wheezes, crackles, or rhonchi.  Gastrointestinal: Soft, non tender, and non distended with positive bowel sounds. No rebound or guarding. No palpable pulsatile mass Genitourinary: No CVA tenderness. Musculoskeletal: Nontender with normal range of motion in all extremities. No edema, cyanosis, or erythema of extremities. Neurologic: Normal  speech and language. Face is symmetric. Moving all extremities. No gross focal neurologic deficits are appreciated. Skin: Skin is warm, dry and intact. No rash noted. Psychiatric: Mood and affect are normal. Speech and behavior are normal.  ____________________________________________   LABS (all labs ordered are listed, but only abnormal results are displayed)  Labs Reviewed  BASIC METABOLIC PANEL - Abnormal; Notable for the following:       Result Value   Glucose, Bld 107 (*)    All other components within normal limits  HEPATIC FUNCTION PANEL - Abnormal; Notable for the following:    AST 68 (*)    All other components within normal limits  CBC  TROPONIN I  LIPASE, BLOOD  TROPONIN I   ____________________________________________  EKG  ED ECG REPORT I, Rudene Re, the attending physician, personally viewed and interpreted this ECG.  Sinus bradycardia, rate of 57, normal intervals, normal axis, no ST elevations or depressions. ____________________________________________  RADIOLOGY  CT dissection: No evidence of thoracoabdominal aortic aneurysm or dissection.  No evidence of pulmonary embolism.  No evidence of acute cardiopulmonary disease.  4 mm  nonobstructing right lower pole renal calculus. No ureteral or bladder calculi. No hydronephrosis.  Sigmoid diverticulosis, without evidence of diverticulitis. No evidence of bowel obstruction. Normal appendix.  ____________________________________________   PROCEDURES  Procedure(s) performed: None Procedures Critical Care performed:  None ____________________________________________   INITIAL IMPRESSION / ASSESSMENT AND PLAN / ED COURSE  66 y.o. female with a history of hypertension, hyperlipidemia, A. fib, diabetes who presents for evaluation of sudden onset of stabbing pain around the navel radiating to the back which moved into her chest. Pain is mostly resolved at this time. Patient has strong equal distal pulses, and no evidence of pulsatile abdominal mass, she is neurologically intact otherwise. We'll send patient for CT to rule out dissection. EKG with no ischemic changes.    ----------------------------------------- 4:39 PM on 07/20/2016 -----------------------------------------   OBSERVATION CARE: This patient is being placed under observation care for the following reasons: Chest pain with repeat testing to rule out ischemia    ----------------------------------------- 6:00 PM on 07/20/2016 ----------------------------------------- CTA negative. Patient remains pain free. 2nd troponin due 1815.  ----------------------------------------- 7:01 PM on 07/20/2016 -----------------------------------------   END OF OBSERVATION STATUS: After an appropriate period of observation, this patient is being discharged due to the following reason(s):  CTA with no acute findings, troponin 2 is negative. Blood work with no acute findings. Patient remains chest pain free. We'll discharge home with close follow-up with PCP. Recommended she return to the emergency room if her chest pain recurs.     Pertinent labs & imaging results that were available during my care of the patient  were reviewed by me and considered in my medical decision making (see chart for details).    ____________________________________________   FINAL CLINICAL IMPRESSION(S) / ED DIAGNOSES  Final diagnoses:  Abdominal pain, unspecified abdominal location  Chest pain, unspecified type      NEW MEDICATIONS STARTED DURING THIS VISIT:  New Prescriptions   No medications on file     Note:  This document was prepared using Dragon voice recognition software and may include unintentional dictation errors.    Alfred Levins, [place], MD 07/20/16 Lurline Hare

## 2016-08-04 ENCOUNTER — Other Ambulatory Visit: Payer: Self-pay | Admitting: Family Medicine

## 2016-08-06 DIAGNOSIS — Z9842 Cataract extraction status, left eye: Secondary | ICD-10-CM | POA: Diagnosis not present

## 2016-08-06 DIAGNOSIS — Z947 Corneal transplant status: Secondary | ICD-10-CM | POA: Diagnosis not present

## 2016-08-06 DIAGNOSIS — H35351 Cystoid macular degeneration, right eye: Secondary | ICD-10-CM | POA: Diagnosis not present

## 2016-08-06 DIAGNOSIS — Z48810 Encounter for surgical aftercare following surgery on the sense organs: Secondary | ICD-10-CM | POA: Diagnosis not present

## 2016-08-06 DIAGNOSIS — Z961 Presence of intraocular lens: Secondary | ICD-10-CM | POA: Diagnosis not present

## 2016-08-07 DIAGNOSIS — N950 Postmenopausal bleeding: Secondary | ICD-10-CM | POA: Diagnosis not present

## 2016-08-07 DIAGNOSIS — N858 Other specified noninflammatory disorders of uterus: Secondary | ICD-10-CM | POA: Diagnosis not present

## 2016-08-07 DIAGNOSIS — Z86018 Personal history of other benign neoplasm: Secondary | ICD-10-CM | POA: Diagnosis not present

## 2016-09-04 ENCOUNTER — Ambulatory Visit: Payer: Medicare Other | Admitting: Family Medicine

## 2016-09-07 DIAGNOSIS — Z9889 Other specified postprocedural states: Secondary | ICD-10-CM | POA: Insufficient documentation

## 2016-09-08 DIAGNOSIS — Z947 Corneal transplant status: Secondary | ICD-10-CM | POA: Diagnosis not present

## 2016-09-08 DIAGNOSIS — Z48810 Encounter for surgical aftercare following surgery on the sense organs: Secondary | ICD-10-CM | POA: Diagnosis not present

## 2016-09-08 DIAGNOSIS — H25811 Combined forms of age-related cataract, right eye: Secondary | ICD-10-CM | POA: Diagnosis not present

## 2016-09-08 DIAGNOSIS — H35351 Cystoid macular degeneration, right eye: Secondary | ICD-10-CM | POA: Diagnosis not present

## 2016-09-08 DIAGNOSIS — Z9889 Other specified postprocedural states: Secondary | ICD-10-CM | POA: Diagnosis not present

## 2016-09-08 DIAGNOSIS — H1851 Endothelial corneal dystrophy: Secondary | ICD-10-CM | POA: Diagnosis not present

## 2016-09-08 DIAGNOSIS — Z9841 Cataract extraction status, right eye: Secondary | ICD-10-CM | POA: Diagnosis not present

## 2016-09-08 DIAGNOSIS — Z9849 Cataract extraction status, unspecified eye: Secondary | ICD-10-CM | POA: Diagnosis not present

## 2016-09-08 DIAGNOSIS — H26491 Other secondary cataract, right eye: Secondary | ICD-10-CM | POA: Diagnosis not present

## 2016-09-08 DIAGNOSIS — Z8669 Personal history of other diseases of the nervous system and sense organs: Secondary | ICD-10-CM | POA: Diagnosis not present

## 2016-09-08 DIAGNOSIS — Z961 Presence of intraocular lens: Secondary | ICD-10-CM | POA: Diagnosis not present

## 2016-10-08 ENCOUNTER — Ambulatory Visit (INDEPENDENT_AMBULATORY_CARE_PROVIDER_SITE_OTHER): Payer: Medicare Other | Admitting: Family Medicine

## 2016-10-08 ENCOUNTER — Encounter: Payer: Self-pay | Admitting: Family Medicine

## 2016-10-08 VITALS — BP 136/70 | HR 59 | Temp 98.6°F | Resp 16 | Wt 156.4 lb

## 2016-10-08 DIAGNOSIS — R600 Localized edema: Secondary | ICD-10-CM

## 2016-10-08 DIAGNOSIS — R7303 Prediabetes: Secondary | ICD-10-CM | POA: Diagnosis not present

## 2016-10-08 DIAGNOSIS — I10 Essential (primary) hypertension: Secondary | ICD-10-CM | POA: Diagnosis not present

## 2016-10-08 DIAGNOSIS — L409 Psoriasis, unspecified: Secondary | ICD-10-CM | POA: Diagnosis not present

## 2016-10-08 DIAGNOSIS — E785 Hyperlipidemia, unspecified: Secondary | ICD-10-CM

## 2016-10-08 DIAGNOSIS — R319 Hematuria, unspecified: Secondary | ICD-10-CM | POA: Diagnosis not present

## 2016-10-08 LAB — POCT URINALYSIS DIPSTICK
BILIRUBIN UA: NEGATIVE
Glucose, UA: NEGATIVE
Ketones, UA: NEGATIVE
NITRITE UA: NEGATIVE
PH UA: 6.5 (ref 5.0–8.0)
Protein, UA: NEGATIVE
RBC UA: NEGATIVE
Urobilinogen, UA: 0.2 E.U./dL

## 2016-10-08 LAB — URINALYSIS, MICROSCOPIC ONLY

## 2016-10-08 LAB — LDL CHOLESTEROL, DIRECT: Direct LDL: 42 mg/dL

## 2016-10-08 LAB — COMPREHENSIVE METABOLIC PANEL
ALK PHOS: 69 U/L (ref 39–117)
ALT: 21 U/L (ref 0–35)
AST: 24 U/L (ref 0–37)
Albumin: 4.1 g/dL (ref 3.5–5.2)
BILIRUBIN TOTAL: 1.2 mg/dL (ref 0.2–1.2)
BUN: 16 mg/dL (ref 6–23)
CALCIUM: 9.4 mg/dL (ref 8.4–10.5)
CO2: 33 mEq/L — ABNORMAL HIGH (ref 19–32)
Chloride: 103 mEq/L (ref 96–112)
Creatinine, Ser: 0.72 mg/dL (ref 0.40–1.20)
GFR: 85.62 mL/min (ref >60.00)
Glucose, Bld: 122 mg/dL — ABNORMAL HIGH (ref 70–99)
Potassium: 4.2 mEq/L (ref 3.5–5.1)
Sodium: 139 mEq/L (ref 135–145)
TOTAL PROTEIN: 6.9 g/dL (ref 6.0–8.3)

## 2016-10-08 LAB — CBC
HCT: 38.7 % (ref 36.0–46.0)
Hemoglobin: 12.9 g/dL (ref 12.0–15.0)
MCHC: 33.3 g/dL (ref 30.0–36.0)
MCV: 88.8 fl (ref 78.0–100.0)
Platelets: 249 10*3/uL (ref 150.0–400.0)
RBC: 4.36 Mil/uL (ref 3.87–5.11)
RDW: 13.7 % (ref 11.5–15.5)
WBC: 6.8 10*3/uL (ref 4.0–10.5)

## 2016-10-08 LAB — TSH: TSH: 1.31 u[IU]/mL (ref 0.35–4.50)

## 2016-10-08 LAB — HEMOGLOBIN A1C: HEMOGLOBIN A1C: 6.2 % (ref 4.6–6.5)

## 2016-10-08 NOTE — Patient Instructions (Addendum)
Nice to see you. We'll check lab work today and contact you with the results. We'll check your urine as well.

## 2016-10-08 NOTE — Assessment & Plan Note (Signed)
Continue current medications. Check lab work as outlined below.

## 2016-10-08 NOTE — Addendum Note (Signed)
Addended by: Leeanne Rio on: 10/08/2016 09:46 AM   Modules accepted: Orders

## 2016-10-08 NOTE — Progress Notes (Signed)
  [name], MD Phone: [phone]  Molly Gregory is a 66 y.o. female who presents today for follow-up.  Hypertension: Taking amlodipine, losartan, propranolol. No chest pain or shortness of breath. Notes rare ankle edema. No orthopnea or PND. She's been watching what she eats and trying to work out as well.   She was seen in the emergency room for abdominal pain in May. Had pain spreading up from her abdomen up to her chest. She underwent an extensive workup that was unremarkable and she was discharged after she was pain-free. She's not had any recurrent pain.  Psoriasis: Currently on otezla. Feels like this is causing some postnasal drainage. She has spoken with her dermatologist regarding this and no changes were made. She notes this has helped her psoriasis.  Nephrolithiasis: She notes no pain. Does have a kidney stone still in place. Follows with urology. Every 2-3 weeks she'll note a little bit of blood in her urine. She had gynecologic evaluation for this with an ultrasound and biopsy that she reports was negative. She had an x-ray in March through urology that revealed the stone was still in place. She notes no dysuria or abdominal pain. No bleeding today.  PMH: nonsmoker.   ROS see history of present illness  Objective  Physical Exam Vitals:   10/08/16 0844  BP: 136/70  Pulse: (!) 59  Resp: 16  Temp: 98.6 F (37 C)    BP Readings from Last 3 Encounters:  10/08/16 136/70  07/20/16 (!) 153/68  03/07/16 (!) 116/58   Wt Readings from Last 3 Encounters:  10/08/16 156 lb 6.4 oz (70.9 kg)  07/20/16 157 lb (71.2 kg)  03/07/16 158 lb 6.4 oz (71.8 kg)    Physical Exam  Constitutional: No distress.  Cardiovascular: Normal rate, regular rhythm and normal heart sounds.   Pulmonary/Chest: Effort normal and breath sounds normal.  Musculoskeletal: She exhibits no edema.  Neurological: She is alert. Gait normal.  Skin: Skin is warm and dry. She is not diaphoretic.       Assessment/Plan: Please see individual problem list.  Essential hypertension, benign Continue current medications. Check lab work as outlined below.  Psoriasis Continue to follow with dermatology.  Hematuria Likely related to retained stone within her kidney. We'll check a UA today given that she has had UTIs in the past. It appears that her other workup has been negative thus far.  Prediabetes Check A1c.  No recurrence of symptoms that required emergency room evaluation. She'll be reevaluated if these recur.  Orders Placed This Encounter  Procedures  . Comp Met (CMET)  . LDL cholesterol, direct  . HgB A1c  . TSH  . CBC  . POCT Urinalysis Dipstick    Standing Status:   Future    Standing Expiration Date:   12/08/2016    # Healthcare maintenance: Discussed hepatitis C testing though she declined this. Discussed colon cancer screening and advise that she was due in November for this. She'll check with Medicare to make sure that they cover this. Discussed that she could contact us at the end of November or early December and we can set this up for her or if she would prefer we can get this set up at her next follow-up visit.   [name], MD [place]

## 2016-10-08 NOTE — Addendum Note (Signed)
Addended by: Arby Barrette on: 10/08/2016 11:14 AM   Modules accepted: Orders

## 2016-10-08 NOTE — Assessment & Plan Note (Signed)
Check A1c. 

## 2016-10-08 NOTE — Assessment & Plan Note (Signed)
Continue to follow with dermatology.

## 2016-10-08 NOTE — Assessment & Plan Note (Signed)
Likely related to retained stone within her kidney. We'll check a UA today given that she has had UTIs in the past. It appears that her other workup has been negative thus far.

## 2016-10-09 ENCOUNTER — Other Ambulatory Visit: Payer: Self-pay | Admitting: Family Medicine

## 2016-10-09 ENCOUNTER — Encounter: Payer: Self-pay | Admitting: Family Medicine

## 2016-10-09 DIAGNOSIS — E785 Hyperlipidemia, unspecified: Secondary | ICD-10-CM

## 2016-10-09 LAB — URINE CULTURE

## 2016-10-13 DIAGNOSIS — H26491 Other secondary cataract, right eye: Secondary | ICD-10-CM | POA: Diagnosis not present

## 2016-10-13 DIAGNOSIS — Z961 Presence of intraocular lens: Secondary | ICD-10-CM | POA: Diagnosis not present

## 2016-10-13 DIAGNOSIS — Z947 Corneal transplant status: Secondary | ICD-10-CM | POA: Diagnosis not present

## 2016-10-13 DIAGNOSIS — Z79899 Other long term (current) drug therapy: Secondary | ICD-10-CM | POA: Diagnosis not present

## 2016-10-13 DIAGNOSIS — Z9842 Cataract extraction status, left eye: Secondary | ICD-10-CM | POA: Diagnosis not present

## 2016-10-24 DIAGNOSIS — D0439 Carcinoma in situ of skin of other parts of face: Secondary | ICD-10-CM | POA: Diagnosis not present

## 2016-10-24 DIAGNOSIS — C44729 Squamous cell carcinoma of skin of left lower limb, including hip: Secondary | ICD-10-CM | POA: Diagnosis not present

## 2016-10-24 DIAGNOSIS — D485 Neoplasm of uncertain behavior of skin: Secondary | ICD-10-CM | POA: Diagnosis not present

## 2016-10-24 DIAGNOSIS — R208 Other disturbances of skin sensation: Secondary | ICD-10-CM | POA: Diagnosis not present

## 2016-11-06 DIAGNOSIS — G629 Polyneuropathy, unspecified: Secondary | ICD-10-CM | POA: Diagnosis not present

## 2016-11-10 ENCOUNTER — Other Ambulatory Visit (INDEPENDENT_AMBULATORY_CARE_PROVIDER_SITE_OTHER): Payer: Medicare Other

## 2016-11-10 DIAGNOSIS — E785 Hyperlipidemia, unspecified: Secondary | ICD-10-CM

## 2016-11-10 LAB — HEPATIC FUNCTION PANEL
ALT: 20 U/L (ref 0–35)
AST: 23 U/L (ref 0–37)
Albumin: 4.1 g/dL (ref 3.5–5.2)
Alkaline Phosphatase: 62 U/L (ref 39–117)
BILIRUBIN TOTAL: 1 mg/dL (ref 0.2–1.2)
Bilirubin, Direct: 0.2 mg/dL (ref 0.0–0.3)
Total Protein: 6.9 g/dL (ref 6.0–8.3)

## 2016-11-10 LAB — LDL CHOLESTEROL, DIRECT: LDL DIRECT: 55 mg/dL

## 2016-11-12 DIAGNOSIS — L905 Scar conditions and fibrosis of skin: Secondary | ICD-10-CM | POA: Diagnosis not present

## 2016-11-12 DIAGNOSIS — Z961 Presence of intraocular lens: Secondary | ICD-10-CM | POA: Diagnosis not present

## 2016-11-12 DIAGNOSIS — Z9842 Cataract extraction status, left eye: Secondary | ICD-10-CM | POA: Diagnosis not present

## 2016-11-12 DIAGNOSIS — C44729 Squamous cell carcinoma of skin of left lower limb, including hip: Secondary | ICD-10-CM | POA: Diagnosis not present

## 2016-11-20 DIAGNOSIS — T814XXA Infection following a procedure, initial encounter: Secondary | ICD-10-CM | POA: Diagnosis not present

## 2016-11-20 DIAGNOSIS — Z5181 Encounter for therapeutic drug level monitoring: Secondary | ICD-10-CM | POA: Diagnosis not present

## 2016-11-24 ENCOUNTER — Other Ambulatory Visit: Payer: Self-pay | Admitting: Family Medicine

## 2016-11-27 DIAGNOSIS — L908 Other atrophic disorders of skin: Secondary | ICD-10-CM | POA: Diagnosis not present

## 2016-11-27 DIAGNOSIS — L814 Other melanin hyperpigmentation: Secondary | ICD-10-CM | POA: Diagnosis not present

## 2016-11-27 DIAGNOSIS — D0439 Carcinoma in situ of skin of other parts of face: Secondary | ICD-10-CM | POA: Diagnosis not present

## 2016-11-27 DIAGNOSIS — L578 Other skin changes due to chronic exposure to nonionizing radiation: Secondary | ICD-10-CM | POA: Diagnosis not present

## 2016-11-27 DIAGNOSIS — C44320 Squamous cell carcinoma of skin of unspecified parts of face: Secondary | ICD-10-CM | POA: Insufficient documentation

## 2016-12-08 DIAGNOSIS — Z23 Encounter for immunization: Secondary | ICD-10-CM | POA: Diagnosis not present

## 2016-12-19 ENCOUNTER — Other Ambulatory Visit: Payer: Self-pay | Admitting: Family Medicine

## 2016-12-21 ENCOUNTER — Other Ambulatory Visit: Payer: Self-pay | Admitting: Family Medicine

## 2016-12-28 ENCOUNTER — Other Ambulatory Visit: Payer: Self-pay | Admitting: Family Medicine

## 2017-01-01 ENCOUNTER — Encounter: Payer: Self-pay | Admitting: Family Medicine

## 2017-01-02 ENCOUNTER — Other Ambulatory Visit: Payer: Self-pay | Admitting: Family Medicine

## 2017-01-02 MED ORDER — ATORVASTATIN CALCIUM 20 MG PO TABS: 20.0000 mg | ORAL_TABLET | Freq: Every day | ORAL | 3 refills | Status: DC

## 2017-01-03 DIAGNOSIS — Z23 Encounter for immunization: Secondary | ICD-10-CM | POA: Diagnosis not present

## 2017-01-06 ENCOUNTER — Other Ambulatory Visit: Payer: Self-pay

## 2017-01-06 MED ORDER — CITALOPRAM HYDROBROMIDE 10 MG PO TABS: 10.0000 mg | ORAL_TABLET | Freq: Every day | ORAL | 0 refills | Status: DC

## 2017-01-06 NOTE — Telephone Encounter (Signed)
patient requested 90 day

## 2017-01-26 DIAGNOSIS — Z1212 Encounter for screening for malignant neoplasm of rectum: Secondary | ICD-10-CM | POA: Diagnosis not present

## 2017-01-26 DIAGNOSIS — Z1211 Encounter for screening for malignant neoplasm of colon: Secondary | ICD-10-CM | POA: Diagnosis not present

## 2017-01-27 LAB — COLOGUARD: Cologuard: NEGATIVE

## 2017-02-02 ENCOUNTER — Encounter: Payer: Self-pay | Admitting: Family Medicine

## 2017-02-04 ENCOUNTER — Telehealth: Payer: Self-pay

## 2017-02-04 NOTE — Telephone Encounter (Signed)
Patient notified of negative cologuard results

## 2017-02-13 ENCOUNTER — Other Ambulatory Visit: Payer: Self-pay | Admitting: Family Medicine

## 2017-02-16 ENCOUNTER — Other Ambulatory Visit: Payer: Self-pay

## 2017-02-16 MED ORDER — AMLODIPINE BESYLATE 10 MG PO TABS: 10.0000 mg | ORAL_TABLET | Freq: Every day | ORAL | 2 refills | Status: DC

## 2017-02-17 ENCOUNTER — Ambulatory Visit
Admission: RE | Admit: 2017-02-17 | Discharge: 2017-02-17 | Disposition: A | Discharge status: Home / Self Care | Payer: Medicare Other | Source: Ambulatory Visit | Attending: Urology | Admitting: Urology

## 2017-02-17 ENCOUNTER — Ambulatory Visit (INDEPENDENT_AMBULATORY_CARE_PROVIDER_SITE_OTHER): Payer: Medicare Other | Admitting: Urology

## 2017-02-17 ENCOUNTER — Other Ambulatory Visit: Payer: Self-pay

## 2017-02-17 ENCOUNTER — Encounter: Payer: Self-pay | Admitting: Urology

## 2017-02-17 VITALS — BP 160/71 | HR 54 | Ht 64.0 in | Wt 157.1 lb

## 2017-02-17 DIAGNOSIS — N200 Calculus of kidney: Secondary | ICD-10-CM

## 2017-02-17 DIAGNOSIS — R310 Gross hematuria: Secondary | ICD-10-CM

## 2017-02-17 DIAGNOSIS — K8020 Calculus of gallbladder without cholecystitis without obstruction: Secondary | ICD-10-CM | POA: Diagnosis not present

## 2017-02-17 DIAGNOSIS — Z87448 Personal history of other diseases of urinary system: Secondary | ICD-10-CM

## 2017-02-17 LAB — MICROSCOPIC EXAMINATION

## 2017-02-17 LAB — URINALYSIS, COMPLETE
BILIRUBIN UA: NEGATIVE
Glucose, UA: NEGATIVE
KETONES UA: NEGATIVE
Nitrite, UA: NEGATIVE
PH UA: 5.5 (ref 5.0–7.5)
PROTEIN UA: NEGATIVE
Specific Gravity, UA: 1.01 (ref 1.005–1.030)
UUROB: 0.2 mg/dL (ref 0.2–1.0)

## 2017-02-17 NOTE — Progress Notes (Addendum)
02/17/2017 4:06 PM   Molly Gregory 08/01/1950 [id number]  Referring provider: Leone Haven, MD [address] [place], [place] [address]  Chief Complaint  Patient presents with  . Hematuria    HPI: 66 yo WF with a history of hematuria and right renal stones who presents today for a one year follow up.  History of hematuria CT w/o and w on 01/22/2016 which noted nonobstructing right renal stones. No left renal stones. No ureteral or bladder stones. No hydronephrosis.  No renal cortical masses. No urothelial lesions, with limitations as described.  Additional findings include aortic atherosclerosis, cholelithiasis, small sliding hiatal hernia, mild sigmoid diverticulosis and small left uterine fibroid.  She is not a smoker.   She was exposed to secondhand smoke.  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  Cystoscopy performed on 02/15/2016 was negative.  UA negative for AMH at today's visit.  She states that she sees blood in her urine when she is having some back pain.  It is not a large amount, just a few spots.    Right renal stones Nonobstructing 4 mm and 10mm lower right renal stones. No left renal stones on 01/2016 CT.  KUB taken today demonstrates the stone has not changed in size or position.     PMH: Past Medical History:  Diagnosis Date  . Allergy   . Anxiety   . Arthritis   . Diabetes mellitus without complication ([place])   . H/O exercise stress test    normal  . H/O psoriasis   . Head injury, closed 2013  . Hiatal hernia   . History of chicken pox   . Hx of acute bronchitis   . Hx of migraines   . Hyperlipidemia   . Hypertension   . Irregular heart beat     Surgical History: Past Surgical History:  Procedure Laterality Date  . CARPAL TUNNEL RELEASE Bilateral    both hands, Dr. Ranae Pila  . CATARACT EXTRACTION Left   . EYE SURGERY Left    cornea transplant, Dr. Sandra Cockayne  . TRIGGER FINGER RELEASE Bilateral   . TUBAL LIGATION       Home Medications:  Allergies as of 02/17/2017      Reactions   Penicillins       Medication List        Accurate as of 02/17/17  4:06 PM. Always use your most recent med list.          albuterol 108 (90 Base) MCG/ACT inhaler Commonly known as:  PROVENTIL HFA;VENTOLIN HFA Inhale 2 puffs into the lungs every 6 (six) hours as needed for wheezing or shortness of breath.   amLODipine 10 MG tablet Commonly known as:  NORVASC Take 1 tablet (10 mg total) by mouth daily.   atorvastatin 20 MG tablet Commonly known as:  LIPITOR Take 1 tablet (20 mg total) by mouth daily.   CALCIUM + D PO Take by mouth daily.   citalopram 10 MG tablet Commonly known as:  CELEXA Take 1 tablet (10 mg total) daily by mouth.   gabapentin 300 MG capsule Commonly known as:  NEURONTIN TAKE 1 CAPSULE (300 MG TOTAL) BY MOUTH NIGHTLY.   Ibuprofen 200 MG Caps Take by mouth.   losartan 100 MG tablet Commonly known as:  COZAAR TAKE 1 TABLET (100 MG TOTAL) BY MOUTH DAILY.   OTEZLA 30 MG Tabs Generic drug:  Apremilast Take by mouth 2 (two) times daily.   propranolol ER 80 MG 24  hr capsule Commonly known as:  INDERAL LA TAKE 1 CAPSULE (80 MG TOTAL) BY MOUTH DAILY.   psyllium 58.6 % packet Commonly known as:  METAMUCIL Take 1 packet by mouth daily.   triamcinolone cream 0.1 % Commonly known as:  KENALOG Apply 1 application topically 2 (two) times daily.   WOMENS MULTIVITAMIN PLUS PO Take by mouth.       Allergies:  Allergies  Allergen Reactions  . Penicillins     Family History: Family History  Problem Relation Age of Onset  . Hypertension Maternal Aunt   . Aneurysm Maternal Aunt   . Kidney failure Maternal Aunt   . Diabetes Maternal Grandmother   . Heart disease Maternal Grandmother   . Hypertension Maternal Grandmother   . Diabetes Paternal Grandmother   . Heart disease Paternal Grandmother   . Hypertension Paternal Grandmother   . Kidney cancer Cousin   . Bladder  Cancer Neg Hx     Social History:  reports that  has never smoked. she has never used smokeless tobacco. She reports that she does not drink alcohol or use drugs.  ROS: UROLOGY Frequent Urination?: No Hard to postpone urination?: No Burning/pain with urination?: No Get up at night to urinate?: No Leakage of urine?: No Urine stream starts and stops?: No Trouble starting stream?: No Do you have to strain to urinate?: No Blood in urine?: No Urinary tract infection?: No Sexually transmitted disease?: No Injury to kidneys or bladder?: No Painful intercourse?: No Weak stream?: No Currently pregnant?: No Vaginal bleeding?: No Last menstrual period?: n  Gastrointestinal Nausea?: No Vomiting?: No Indigestion/heartburn?: No Diarrhea?: No Constipation?: No  Constitutional Fever: No Night sweats?: No Weight loss?: No Fatigue?: No  Skin Skin rash/lesions?: Yes Itching?: No  Eyes Blurred vision?: No Double vision?: No  Ears/Nose/Throat Sore throat?: No Sinus problems?: Yes  Hematologic/Lymphatic Swollen glands?: No Easy bruising?: No  Cardiovascular Leg swelling?: No Chest pain?: No  Respiratory Cough?: Yes Shortness of breath?: No  Endocrine Excessive thirst?: No  Musculoskeletal Back pain?: No Joint pain?: No  Neurological Headaches?: No Dizziness?: No  Psychologic Depression?: No Anxiety?: Yes  Physical Exam: BP (!) 160/71   Pulse (!) 54   Ht 5\' 4"  (1.626 m)   Wt 157 lb 1.6 oz (71.3 kg)   BMI 26.97 kg/m   Constitutional: Well nourished. Alert and oriented, No acute distress. HEENT: [place] AT, moist mucus membranes. Trachea midline, no masses. Cardiovascular: No clubbing, cyanosis, or edema. Respiratory: Normal respiratory effort, no increased work of breathing. GI: Abdomen is soft, non tender, non distended, no abdominal masses.  GU: No CVA tenderness.  No bladder fullness or masses.   Skin: No rashes, bruises or suspicious lesions. Lymph:  No cervical or inguinal adenopathy. Neurologic: Grossly intact, no focal deficits, moving all 4 extremities. Psychiatric: Normal mood and affect.  Laboratory Data: Lab Results  Component Value Date   WBC 6.8 10/08/2016   HGB 12.9 10/08/2016   HCT 38.7 10/08/2016   MCV 88.8 10/08/2016   PLT 249.0 10/08/2016    Lab Results  Component Value Date   CREATININE 0.72 10/08/2016     Lab Results  Component Value Date   HGBA1C 6.2 10/08/2016    Lab Results  Component Value Date   TSH 1.31 10/08/2016     Lab Results  Component Value Date   AST 23 11/10/2016   Lab Results  Component Value Date   ALT 20 11/10/2016    Urinalysis Negative.  See EPIC.  Pertinent Imaging: CLINICAL DATA:  Gross hematuria  EXAM: ABDOMEN - 1 VIEW  COMPARISON:  06/04/2016  FINDINGS: There is no bowel dilatation to suggest obstruction. There is no evidence of pneumoperitoneum, portal venous gas or pneumatosis. There are no pathologic calcifications along the expected course of the ureters. Stable 3 mm right renal calculus. Cholelithiasis. The osseous structures are unremarkable.  IMPRESSION: 1. Stable 3 mm right renal calculus. 2. Cholelithiasis.   Electronically Signed   By: Kathreen Devoid   On: 02/17/2017 14:05  I have independently reviewed the films - ? Small left renal stone  Assessment & Plan:    1. History of hematuria  - hematuria work up in 2017 with CTU and cystoscopy did not find any worrisome GU lesions  - today's UA is negative  - spots of blood with back pain  - RTC in one year for UA  2. Right renal stone  - unchanged on today KUB  - Advised to contact our office or seek treatment in the ED if becomes febrile or pain/ vomiting are difficult control in order to arrange for emergent/urgent intervention  - RTC in one year for KUB  Return in about 1 year (around 02/17/2018) for KUB and UA.  These notes generated with voice recognition software. I apologize  for typographical errors.  Zara Council, [facility] [address] [place], [place] [address] [phone]

## 2017-03-10 ENCOUNTER — Telehealth: Payer: Self-pay | Admitting: Family Medicine

## 2017-03-10 DIAGNOSIS — M67442 Ganglion, left hand: Secondary | ICD-10-CM | POA: Diagnosis not present

## 2017-03-10 DIAGNOSIS — D485 Neoplasm of uncertain behavior of skin: Secondary | ICD-10-CM | POA: Diagnosis not present

## 2017-03-10 DIAGNOSIS — L821 Other seborrheic keratosis: Secondary | ICD-10-CM | POA: Diagnosis not present

## 2017-03-10 DIAGNOSIS — L570 Actinic keratosis: Secondary | ICD-10-CM | POA: Diagnosis not present

## 2017-03-10 DIAGNOSIS — D225 Melanocytic nevi of trunk: Secondary | ICD-10-CM | POA: Diagnosis not present

## 2017-03-10 DIAGNOSIS — L400 Psoriasis vulgaris: Secondary | ICD-10-CM | POA: Diagnosis not present

## 2017-03-10 DIAGNOSIS — X32XXXA Exposure to sunlight, initial encounter: Secondary | ICD-10-CM | POA: Diagnosis not present

## 2017-03-10 DIAGNOSIS — Z85828 Personal history of other malignant neoplasm of skin: Secondary | ICD-10-CM | POA: Diagnosis not present

## 2017-03-10 NOTE — Telephone Encounter (Signed)
Pt states that she is having even more anxiety lately and Dr. Evorn Gong stated that she needed to be seen sooner with you.Molly Gregory Please advise

## 2017-03-10 NOTE — Telephone Encounter (Signed)
Thursday at 12 please schedule

## 2017-03-11 NOTE — Telephone Encounter (Signed)
Pt called and scheduled

## 2017-03-12 ENCOUNTER — Other Ambulatory Visit: Payer: Self-pay

## 2017-03-12 ENCOUNTER — Ambulatory Visit: Payer: Medicare Other | Admitting: Family Medicine

## 2017-03-12 ENCOUNTER — Encounter: Payer: Self-pay | Admitting: Family Medicine

## 2017-03-12 ENCOUNTER — Ambulatory Visit (INDEPENDENT_AMBULATORY_CARE_PROVIDER_SITE_OTHER): Payer: Medicare Other | Admitting: Family Medicine

## 2017-03-12 DIAGNOSIS — F419 Anxiety disorder, unspecified: Secondary | ICD-10-CM

## 2017-03-12 DIAGNOSIS — J309 Allergic rhinitis, unspecified: Secondary | ICD-10-CM

## 2017-03-12 DIAGNOSIS — R319 Hematuria, unspecified: Secondary | ICD-10-CM | POA: Diagnosis not present

## 2017-03-12 NOTE — Patient Instructions (Signed)
Nice to see you. Please monitor your anxiety.  If it worsens please let us know.  If it is not improving by the end of this month please let us know and we could increase your Celexa. Please monitor your kidney stone symptoms.  If they worsen or you develop significant pain please be reevaluated. Please try Claritin for your allergy symptoms.

## 2017-03-12 NOTE — Progress Notes (Signed)
  [name], MD Phone: [phone]  Molly Gregory is a 67 y.o. female who presents today for follow-up.  Anxiety: Patient notes she tapered herself off of Celexa in October.  She was off of it for about a month or so and then restarted it around Thanksgiving.  She notes fairly significant anxiety when she came off of it.  She is back on it now.  She has been talking to her pastor.  She notes no significant depression.  No SI.  She notes slight improvement with going back on the Celexa.  Does have history of kidney stones.  She has been following with urology.  She has intermittent small amounts of hematuria.  Notes her kidney stone is still in place.  No pain.  No dysuria.  She does note some congestion in her throat with some postnasal drip.  History of allergies.  Mostly bothers her in the morning and she has to clear her throat.  No reflux.  Not taking any medications for this.  Social History   Tobacco Use  Smoking Status Never Smoker  Smokeless Tobacco Never Used     ROS see history of present illness  Objective  Physical Exam Vitals:   03/12/17 0902 03/12/17 0942  BP: (!) 142/60 136/60  Pulse: 67   Temp: 98.3 F (36.8 C)   SpO2: 97%     BP Readings from Last 3 Encounters:  03/12/17 136/60  02/17/17 (!) 160/71  10/08/16 136/70   Wt Readings from Last 3 Encounters:  03/12/17 153 lb (69.4 kg)  02/17/17 157 lb 1.6 oz (71.3 kg)  10/08/16 156 lb 6.4 oz (70.9 kg)    Physical Exam  Constitutional: No distress.  HENT:  Mouth/Throat: Oropharynx is clear and moist. No oropharyngeal exudate.  Cardiovascular: Normal rate, regular rhythm and normal heart sounds.  Pulmonary/Chest: Effort normal and breath sounds normal.  Abdominal:  No CVA tenderness  Musculoskeletal: She exhibits no edema.  Neurological: She is alert. Gait normal.  Skin: Skin is warm and dry. She is not diaphoretic.     Assessment/Plan: Please see individual problem  list.  Hematuria Related to nephrolithiasis.  Kidney stone still in place.  Intermittent mild hematuria.  Plan in place for her to follow-up with urology yearly.  Discussed return precautions.  Anxiety Worsened with coming off of Celexa.  Currently doing better back on Celexa.  She will monitor and continue this medication.  Allergic rhinitis Suspect allergies.  She will trial Claritin for this.  If not improving she will let us know.   Shy was seen today for anxiety.  Diagnoses and all orders for this visit:  Hematuria, unspecified type  Anxiety  Allergic rhinitis, unspecified seasonality, unspecified trigger    No orders of the defined types were placed in this encounter.   No orders of the defined types were placed in this encounter.    [name], MD [place]

## 2017-03-14 DIAGNOSIS — F419 Anxiety disorder, unspecified: Secondary | ICD-10-CM | POA: Insufficient documentation

## 2017-03-14 DIAGNOSIS — J309 Allergic rhinitis, unspecified: Secondary | ICD-10-CM | POA: Insufficient documentation

## 2017-03-14 NOTE — Assessment & Plan Note (Signed)
Related to nephrolithiasis.  Kidney stone still in place.  Intermittent mild hematuria.  Plan in place for her to follow-up with urology yearly.  Discussed return precautions.

## 2017-03-14 NOTE — Assessment & Plan Note (Signed)
Worsened with coming off of Celexa.  Currently doing better back on Celexa.  She will monitor and continue this medication.

## 2017-03-14 NOTE — Assessment & Plan Note (Signed)
Suspect allergies.  She will trial Claritin for this.  If not improving she will let us know.

## 2017-03-17 DIAGNOSIS — H17813 Minor opacity of cornea, bilateral: Secondary | ICD-10-CM | POA: Insufficient documentation

## 2017-03-18 DIAGNOSIS — Z961 Presence of intraocular lens: Secondary | ICD-10-CM | POA: Diagnosis not present

## 2017-03-18 DIAGNOSIS — Z9841 Cataract extraction status, right eye: Secondary | ICD-10-CM | POA: Diagnosis not present

## 2017-03-18 DIAGNOSIS — Z9889 Other specified postprocedural states: Secondary | ICD-10-CM | POA: Diagnosis not present

## 2017-03-18 DIAGNOSIS — Z947 Corneal transplant status: Secondary | ICD-10-CM | POA: Diagnosis not present

## 2017-03-18 DIAGNOSIS — Z9842 Cataract extraction status, left eye: Secondary | ICD-10-CM | POA: Diagnosis not present

## 2017-03-18 DIAGNOSIS — H17813 Minor opacity of cornea, bilateral: Secondary | ICD-10-CM | POA: Diagnosis not present

## 2017-03-20 DIAGNOSIS — Z1231 Encounter for screening mammogram for malignant neoplasm of breast: Secondary | ICD-10-CM | POA: Diagnosis not present

## 2017-03-20 LAB — HM MAMMOGRAPHY

## 2017-03-25 ENCOUNTER — Encounter: Payer: Self-pay | Admitting: Family Medicine

## 2017-04-10 ENCOUNTER — Encounter: Payer: Self-pay | Admitting: Family Medicine

## 2017-04-10 ENCOUNTER — Ambulatory Visit (INDEPENDENT_AMBULATORY_CARE_PROVIDER_SITE_OTHER): Payer: Medicare Other | Admitting: Family Medicine

## 2017-04-10 VITALS — BP 130/72 | HR 56 | Temp 98.5°F | Wt 156.4 lb

## 2017-04-10 DIAGNOSIS — F419 Anxiety disorder, unspecified: Secondary | ICD-10-CM

## 2017-04-10 DIAGNOSIS — R7303 Prediabetes: Secondary | ICD-10-CM | POA: Diagnosis not present

## 2017-04-10 DIAGNOSIS — I10 Essential (primary) hypertension: Secondary | ICD-10-CM | POA: Diagnosis not present

## 2017-04-10 DIAGNOSIS — E785 Hyperlipidemia, unspecified: Secondary | ICD-10-CM | POA: Insufficient documentation

## 2017-04-10 DIAGNOSIS — R319 Hematuria, unspecified: Secondary | ICD-10-CM | POA: Diagnosis not present

## 2017-04-10 LAB — POCT URINALYSIS DIPSTICK
BILIRUBIN UA: NEGATIVE
GLUCOSE UA: NEGATIVE
Ketones, UA: NEGATIVE
Nitrite, UA: NEGATIVE
Protein, UA: NEGATIVE
Spec Grav, UA: 1.01 (ref 1.010–1.025)
Urobilinogen, UA: 0.2 E.U./dL
pH, UA: 7 (ref 5.0–8.0)

## 2017-04-10 LAB — COMPREHENSIVE METABOLIC PANEL
ALBUMIN: 4.1 g/dL (ref 3.5–5.2)
ALT: 27 U/L (ref 0–35)
AST: 24 U/L (ref 0–37)
Alkaline Phosphatase: 62 U/L (ref 39–117)
BILIRUBIN TOTAL: 1.3 mg/dL — AB (ref 0.2–1.2)
BUN: 17 mg/dL (ref 6–23)
CHLORIDE: 106 meq/L (ref 96–112)
CO2: 33 mEq/L — ABNORMAL HIGH (ref 19–32)
CREATININE: 0.71 mg/dL (ref 0.40–1.20)
Calcium: 9.4 mg/dL (ref 8.4–10.5)
GFR: 86.88 mL/min (ref >60.00)
Glucose, Bld: 125 mg/dL — ABNORMAL HIGH (ref 70–99)
Potassium: 4.9 mEq/L (ref 3.5–5.1)
Sodium: 143 mEq/L (ref 135–145)
TOTAL PROTEIN: 7 g/dL (ref 6.0–8.3)

## 2017-04-10 LAB — URINALYSIS, MICROSCOPIC ONLY

## 2017-04-10 LAB — HEMOGLOBIN A1C: Hgb A1c MFr Bld: 6.2 % (ref 4.6–6.5)

## 2017-04-10 NOTE — Addendum Note (Signed)
Addended by: Arby Barrette on: 04/10/2017 02:46 PM   Modules accepted: Orders

## 2017-04-10 NOTE — Assessment & Plan Note (Signed)
Well-controlled.  Check lab work today.  Continue current regimen.

## 2017-04-10 NOTE — Patient Instructions (Signed)
Nice to see you. We will check some lab work today and contact you with the results. Please monitor your anxiety.

## 2017-04-10 NOTE — Progress Notes (Signed)
  [name], MD Phone: [phone]  Molly Gregory is a 67 y.o. female who presents today for follow-up.  Anxiety: Notes this is quite a bit better.  Only anxious when there is a lot going on.  Taking Celexa.  No depression.  Hypertension: Notes it is normal when she does check it.  Taking losartan, amlodipine, propranolol.  No chest pain, shortness of breath.  Rare ankle edema if she is on her feet for a long period of time.  No orthopnea or PND.  Hyperlipidemia: Taking Lipitor.  No right upper quadrant pain or myalgias.  She is due for an A1c for prediabetes.  History of hematuria related to kidney stones.  Patient wants to check urinalysis. She notes no symptoms.   Social History   Tobacco Use  Smoking Status Never Smoker  Smokeless Tobacco Never Used     ROS see history of present illness  Objective  Physical Exam Vitals:   04/10/17 0839  BP: 130/72  Pulse: (!) 56  Temp: 98.5 F (36.9 C)  SpO2: 96%    BP Readings from Last 3 Encounters:  04/10/17 130/72  03/12/17 136/60  02/17/17 (!) 160/71   Wt Readings from Last 3 Encounters:  04/10/17 156 lb 6.4 oz (70.9 kg)  03/12/17 153 lb (69.4 kg)  02/17/17 157 lb 1.6 oz (71.3 kg)    Physical Exam  Constitutional: No distress.  Cardiovascular: Normal rate, regular rhythm and normal heart sounds.  Pulmonary/Chest: Effort normal and breath sounds normal.  Musculoskeletal: She exhibits no edema.  Neurological: She is alert. Gait normal.  Skin: Skin is warm and dry. She is not diaphoretic.     Assessment/Plan: Please see individual problem list.  Essential hypertension, benign Well-controlled.  Check lab work today.  Continue current regimen.  Prediabetes Check A1c.  Anxiety Improved.  Continue Celexa.  Hyperlipidemia Well-controlled.  Continue Lipitor.  Hematuria Related to nephrolithiasis.  Patient wants to check a urinalysis today.   Orders Placed This Encounter  Procedures  . HgB A1c    . Comp Met (CMET)  . POCT Urinalysis Dipstick    No orders of the defined types were placed in this encounter.    [name], MD [place]

## 2017-04-10 NOTE — Assessment & Plan Note (Signed)
Related to nephrolithiasis.  Patient wants to check a urinalysis today.

## 2017-04-10 NOTE — Assessment & Plan Note (Signed)
Check A1c. 

## 2017-04-10 NOTE — Assessment & Plan Note (Signed)
Well-controlled.  Continue Lipitor. 

## 2017-04-10 NOTE — Assessment & Plan Note (Signed)
Improved.  Continue Celexa.

## 2017-04-13 ENCOUNTER — Encounter: Payer: Self-pay | Admitting: Family Medicine

## 2017-04-13 ENCOUNTER — Other Ambulatory Visit: Payer: Self-pay | Admitting: Family Medicine

## 2017-04-13 DIAGNOSIS — R17 Unspecified jaundice: Secondary | ICD-10-CM

## 2017-04-20 ENCOUNTER — Other Ambulatory Visit (INDEPENDENT_AMBULATORY_CARE_PROVIDER_SITE_OTHER): Payer: Medicare Other

## 2017-04-20 DIAGNOSIS — R17 Unspecified jaundice: Secondary | ICD-10-CM

## 2017-04-20 LAB — BILIRUBIN, FRACTIONATED(TOT/DIR/INDIR)
BILIRUBIN DIRECT: 0.2 mg/dL (ref 0.0–0.2)
Indirect Bilirubin: 0.6 mg/dL (calc) (ref 0.2–1.2)
Total Bilirubin: 0.8 mg/dL (ref 0.2–1.2)

## 2017-05-15 ENCOUNTER — Ambulatory Visit (INDEPENDENT_AMBULATORY_CARE_PROVIDER_SITE_OTHER): Payer: Medicare Other | Admitting: Internal Medicine

## 2017-05-15 ENCOUNTER — Telehealth: Payer: Self-pay | Admitting: Internal Medicine

## 2017-05-15 ENCOUNTER — Encounter: Payer: Self-pay | Admitting: Internal Medicine

## 2017-05-15 VITALS — BP 128/64 | HR 61 | Temp 98.6°F | Resp 18 | Wt 156.0 lb

## 2017-05-15 DIAGNOSIS — J0110 Acute frontal sinusitis, unspecified: Secondary | ICD-10-CM | POA: Diagnosis not present

## 2017-05-15 MED ORDER — PREDNISONE 20 MG PO TABS: 40.0000 mg | ORAL_TABLET | Freq: Every day | ORAL | 0 refills | Status: DC

## 2017-05-15 MED ORDER — CEFDINIR 300 MG PO CAPS: 300.0000 mg | ORAL_CAPSULE | Freq: Two times a day (BID) | ORAL | 0 refills | Status: DC

## 2017-05-15 NOTE — Telephone Encounter (Signed)
Copied from [place]. Topic: Quick Communication - See Telephone Encounter >> May 15, 2017  1:12 PM Cleaster Corin, [place] wrote: CRM for notification. See Telephone encounter for:   05/15/17. Vicente Males calling from [place]. Silvio Pate has an rx order for cefdinir (OMNICEF) 300 MG capsule [[id number]]  Pt. Has an allergie to penicillins and wanted to know if it was ok to still fill med.   [id number], [place] - [address] [place] [address] Phone: [phone] Fax: [phone]

## 2017-05-15 NOTE — Progress Notes (Signed)
Subjective:    Patient ID: Molly Gregory, female    DOB: Dec 19, 1950, 67 y.o.   MRN: [id number]  HPI Here due to respiratory illness  Started almost 2 weeks ago Using inhaler and coricidin HBP Some wheezing and ongoing cough--especially bad at night Stuffy nose with yellow mucus Drainage and coughs the same stuff up No fever Mild SOB No chills or sweats Some sore throat--- brief at the beginnning Mild ear pain on left  Current Outpatient Medications on File Prior to Visit  Medication Sig Dispense Refill  . albuterol (PROVENTIL HFA;VENTOLIN HFA) 108 (90 Base) MCG/ACT inhaler Inhale 2 puffs into the lungs every 6 (six) hours as needed for wheezing or shortness of breath. 1 Inhaler 0  . amLODipine (NORVASC) 10 MG tablet Take 1 tablet (10 mg total) by mouth daily. 90 tablet 2  . Apremilast 30 MG TABS Take 1 tablet by mouth 2 (two) times daily.    Marland Kitchen atorvastatin (LIPITOR) 20 MG tablet Take 1 tablet (20 mg total) by mouth daily. (Patient taking differently: Take 10 mg by mouth daily. ) 90 tablet 3  . Calcium Carbonate-Vitamin D (CALCIUM + D PO) Take by mouth daily.    . Ibuprofen 200 MG CAPS Take by mouth.    . losartan (COZAAR) 100 MG tablet TAKE 1 TABLET (100 MG TOTAL) BY MOUTH DAILY. 90 tablet 1  . Multiple Vitamins-Minerals (WOMENS MULTIVITAMIN PLUS PO) Take by mouth.    . propranolol ER (INDERAL LA) 80 MG 24 hr capsule TAKE 1 CAPSULE (80 MG TOTAL) BY MOUTH DAILY. 90 capsule 1  . psyllium (METAMUCIL) 58.6 % packet Take 1 packet by mouth daily.    Marland Kitchen triamcinolone cream (KENALOG) 0.1 % Apply 1 application topically 2 (two) times daily.    . citalopram (CELEXA) 10 MG tablet Take 1 tablet (10 mg total) daily by mouth. 90 tablet 0   No current facility-administered medications on file prior to visit.     Allergies  Allergen Reactions  . Penicillins     Past Medical History:  Diagnosis Date  . Allergy   . Anxiety   . Arthritis   . Diabetes mellitus without complication  ([place])   . H/O exercise stress test    normal  . H/O psoriasis   . Head injury, closed 2013  . Hiatal hernia   . History of chicken pox   . Hx of acute bronchitis   . Hx of migraines   . Hyperlipidemia   . Hypertension   . Irregular heart beat     Past Surgical History:  Procedure Laterality Date  . CARPAL TUNNEL RELEASE Bilateral    both hands, Dr. Ranae Pila  . CATARACT EXTRACTION Left   . EYE SURGERY Left    cornea transplant, Dr. Sandra Cockayne  . TRIGGER FINGER RELEASE Bilateral   . TUBAL LIGATION      Family History  Problem Relation Age of Onset  . Hypertension Maternal Aunt   . Aneurysm Maternal Aunt   . Kidney failure Maternal Aunt   . Diabetes Maternal Grandmother   . Heart disease Maternal Grandmother   . Hypertension Maternal Grandmother   . Diabetes Paternal Grandmother   . Heart disease Paternal Grandmother   . Hypertension Paternal Grandmother   . Kidney cancer Cousin   . Bladder Cancer Neg Hx     Social History   Socioeconomic History  . Marital status: Married    Spouse name: Not on file  . Number of children: Not on  file  . Years of education: Not on file  . Highest education level: Not on file  Social Needs  . Financial resource strain: Not on file  . Food insecurity - worry: Not on file  . Food insecurity - inability: Not on file  . Transportation needs - medical: Not on file  . Transportation needs - non-medical: Not on file  Occupational History  . Not on file  Tobacco Use  . Smoking status: Never Smoker  . Smokeless tobacco: Never Used  Substance and Sexual Activity  . Alcohol use: No  . Drug use: No  . Sexual activity: Not Currently  Other Topics Concern  . Not on file  Social History Narrative   Lives with husband in [place]. Cat in home. 1 daughter and 2 sons. 7 grandchildren.      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 No distress.  HENT:  Mouth/Throat: Oropharynx is clear and moist. No oropharyngeal exudate.  Mild frontal tenderness Moderate nasal inflammation TMs normal   Neck: No thyromegaly present.  Pulmonary/Chest: Effort normal. No respiratory distress. She has no wheezes. She has no rales.  Slightly decreased breath sounds but clear  Lymphadenopathy:    She has no cervical adenopathy.          Assessment & Plan:

## 2017-05-15 NOTE — Telephone Encounter (Signed)
Okay Probably not true allergy and has take cephalosporins in past without problems

## 2017-05-15 NOTE — Assessment & Plan Note (Signed)
Close to 2 weeks and not improving Will try cefdinir--- sick with doxy and PCN allergy Prednisone for 3 days analgesics

## 2017-05-15 NOTE — Telephone Encounter (Signed)
[facility] notified as instructed and verbalized understanding.

## 2017-05-21 ENCOUNTER — Other Ambulatory Visit: Payer: Self-pay | Admitting: Family Medicine

## 2017-05-25 ENCOUNTER — Ambulatory Visit (INDEPENDENT_AMBULATORY_CARE_PROVIDER_SITE_OTHER)
Admission: RE | Admit: 2017-05-25 | Discharge: 2017-05-25 | Disposition: A | Discharge status: Home / Self Care | Payer: Medicare Other | Source: Ambulatory Visit | Attending: Internal Medicine | Admitting: Internal Medicine

## 2017-05-25 ENCOUNTER — Ambulatory Visit (INDEPENDENT_AMBULATORY_CARE_PROVIDER_SITE_OTHER): Payer: Medicare Other | Admitting: Internal Medicine

## 2017-05-25 ENCOUNTER — Encounter: Payer: Self-pay | Admitting: Internal Medicine

## 2017-05-25 VITALS — BP 110/70 | HR 60 | Temp 98.5°F | Resp 12 | Ht 64.0 in | Wt 156.0 lb

## 2017-05-25 DIAGNOSIS — R05 Cough: Secondary | ICD-10-CM

## 2017-05-25 DIAGNOSIS — R059 Cough, unspecified: Secondary | ICD-10-CM

## 2017-05-25 MED ORDER — BENZONATATE 200 MG PO CAPS: 200.0000 mg | ORAL_CAPSULE | Freq: Three times a day (TID) | ORAL | 0 refills | Status: DC | PRN

## 2017-05-25 MED ORDER — AZITHROMYCIN 250 MG PO TABS: ORAL_TABLET | ORAL | 0 refills | Status: DC

## 2017-05-25 NOTE — Assessment & Plan Note (Addendum)
Still with sinus tenderness and drainage but now seems more bronchial Will check CXR to be sure no pneumonia--but now more consistent with atypical infection CXR looks normal Will try zpak for likely atypical infection Benzonatate---- and hydrocodone syrup if that doesn't work

## 2017-05-25 NOTE — Progress Notes (Signed)
Subjective:    Patient ID: Molly Gregory, female    DOB: 1950-09-20, 67 y.o.   MRN: [id number]  HPI Here due to ongoing respiratory symptoms  Seems more bronchial now Still having sinus drainage May have had minimal help from the medications--finished antibiotic 3 days ago No fever Especially bad at night--has to sleep sitting up Clear phlegm---yellow at first  Not wheezing--or just a little (does have some noise after coughing)--mostly in throat Using the inhaler--not helping Some SOB --with steps  Current Outpatient Medications on File Prior to Visit  Medication Sig Dispense Refill  . albuterol (PROVENTIL HFA;VENTOLIN HFA) 108 (90 Base) MCG/ACT inhaler Inhale 2 puffs into the lungs every 6 (six) hours as needed for wheezing or shortness of breath. 1 Inhaler 0  . amLODipine (NORVASC) 10 MG tablet Take 1 tablet (10 mg total) by mouth daily. 90 tablet 2  . Apremilast 30 MG TABS Take 1 tablet by mouth 2 (two) times daily.    Marland Kitchen atorvastatin (LIPITOR) 20 MG tablet Take 1 tablet (20 mg total) by mouth daily. (Patient taking differently: Take 10 mg by mouth daily. ) 90 tablet 3  . Calcium Carbonate-Vitamin D (CALCIUM + D PO) Take by mouth daily.    . citalopram (CELEXA) 10 MG tablet TAKE 1 TABLET (10 MG TOTAL) DAILY BY MOUTH. 90 tablet 0  . Ibuprofen 200 MG CAPS Take by mouth.    . losartan (COZAAR) 100 MG tablet TAKE 1 TABLET (100 MG TOTAL) BY MOUTH DAILY. 90 tablet 1  . Multiple Vitamins-Minerals (WOMENS MULTIVITAMIN PLUS PO) Take by mouth.    . propranolol ER (INDERAL LA) 80 MG 24 hr capsule TAKE 1 CAPSULE (80 MG TOTAL) BY MOUTH DAILY. 90 capsule 1  . psyllium (METAMUCIL) 58.6 % packet Take 1 packet by mouth daily.    Marland Kitchen triamcinolone cream (KENALOG) 0.1 % Apply 1 application topically 2 (two) times daily.     No current facility-administered medications on file prior to visit.     Allergies  Allergen Reactions  . Penicillins     Past Medical History:  Diagnosis Date  .  Allergy   . Anxiety   . Arthritis   . Diabetes mellitus without complication ([place])   . H/O exercise stress test    normal  . H/O psoriasis   . Head injury, closed 2013  . Hiatal hernia   . History of chicken pox   . Hx of acute bronchitis   . Hx of migraines   . Hyperlipidemia   . Hypertension   . Irregular heart beat     Past Surgical History:  Procedure Laterality Date  . CARPAL TUNNEL RELEASE Bilateral    both hands, Dr. Ranae Pila  . CATARACT EXTRACTION Left   . EYE SURGERY Left    cornea transplant, Dr. Sandra Cockayne  . TRIGGER FINGER RELEASE Bilateral   . TUBAL LIGATION      Family History  Problem Relation Age of Onset  . Hypertension Maternal Aunt   . Aneurysm Maternal Aunt   . Kidney failure Maternal Aunt   . Diabetes Maternal Grandmother   . Heart disease Maternal Grandmother   . Hypertension Maternal Grandmother   . Diabetes Paternal Grandmother   . Heart disease Paternal Grandmother   . Hypertension Paternal Grandmother   . Kidney cancer Cousin   . Bladder Cancer Neg Hx     Social History   Socioeconomic History  . Marital status: Married    Spouse name: Not on file  .  Number of children: Not on file  . Years of education: Not on file  . Highest education level: Not on file  Occupational History  . Not on file  Social Needs  . Financial resource strain: Not on file  . Food insecurity:    Worry: Not on file    Inability: Not on file  . Transportation needs:    Medical: Not on file    Non-medical: Not on file  Tobacco Use  . Smoking status: Never Smoker  . Smokeless tobacco: Never Used  Substance and Sexual Activity  . Alcohol use: No  . Drug use: No  . Sexual activity: Not Currently  Lifestyle  . Physical activity:    Days per week: Not on file    Minutes per session: Not on file  . Stress: Not on file  Relationships  . Social connections:    Talks on phone: Not on file    Gets together: Not on file    Attends religious service: Not on  file    Active member of club or organization: Not on file    Attends meetings of clubs or organizations: Not on file    Relationship status: Not on file  . Intimate partner violence:    Fear of current or ex partner: Not on file    Emotionally abused: Not on file    Physically abused: Not on file    Forced sexual activity: Not on file  Other Topics Concern  . Not on file  Social History Narrative   Lives with husband in [place]. Cat in home. 1 daughter and 2 sons. 7 grandchildren.      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 No distress.  HENT:  Mild left maxillary tenderness TMs normal Mild nasal swelling Slight pharyngeal injection  Neck: No thyromegaly present.  Pulmonary/Chest: Effort normal. No respiratory distress. She has no wheezes. She has no rales.  Decreased breath sounds but clear  Lymphadenopathy:    She has no cervical adenopathy.          Assessment & Plan:

## 2017-06-30 ENCOUNTER — Other Ambulatory Visit: Payer: Self-pay | Admitting: Family Medicine

## 2017-07-09 DIAGNOSIS — Z08 Encounter for follow-up examination after completed treatment for malignant neoplasm: Secondary | ICD-10-CM | POA: Diagnosis not present

## 2017-07-09 DIAGNOSIS — D2261 Melanocytic nevi of right upper limb, including shoulder: Secondary | ICD-10-CM | POA: Diagnosis not present

## 2017-07-09 DIAGNOSIS — Z85828 Personal history of other malignant neoplasm of skin: Secondary | ICD-10-CM | POA: Diagnosis not present

## 2017-07-09 DIAGNOSIS — D2262 Melanocytic nevi of left upper limb, including shoulder: Secondary | ICD-10-CM | POA: Diagnosis not present

## 2017-07-09 DIAGNOSIS — L400 Psoriasis vulgaris: Secondary | ICD-10-CM | POA: Diagnosis not present

## 2017-07-09 DIAGNOSIS — D2271 Melanocytic nevi of right lower limb, including hip: Secondary | ICD-10-CM | POA: Diagnosis not present

## 2017-07-09 DIAGNOSIS — D2272 Melanocytic nevi of left lower limb, including hip: Secondary | ICD-10-CM | POA: Diagnosis not present

## 2017-07-09 DIAGNOSIS — D225 Melanocytic nevi of trunk: Secondary | ICD-10-CM | POA: Diagnosis not present

## 2017-08-26 ENCOUNTER — Other Ambulatory Visit: Payer: Self-pay | Admitting: Family Medicine

## 2017-08-27 DIAGNOSIS — Z947 Corneal transplant status: Secondary | ICD-10-CM | POA: Diagnosis not present

## 2017-08-27 LAB — HM DIABETES EYE EXAM

## 2017-08-28 ENCOUNTER — Encounter: Payer: Self-pay | Admitting: Family Medicine

## 2017-10-05 ENCOUNTER — Encounter: Payer: Self-pay | Admitting: Family Medicine

## 2017-10-08 ENCOUNTER — Ambulatory Visit: Payer: Medicare Other | Admitting: Family Medicine

## 2017-10-09 ENCOUNTER — Encounter: Payer: Self-pay | Admitting: Family Medicine

## 2017-10-09 ENCOUNTER — Ambulatory Visit (INDEPENDENT_AMBULATORY_CARE_PROVIDER_SITE_OTHER): Payer: Medicare Other | Admitting: Family Medicine

## 2017-10-09 VITALS — BP 126/64 | HR 59 | Temp 98.1°F | Ht 64.0 in | Wt 148.6 lb

## 2017-10-09 DIAGNOSIS — E785 Hyperlipidemia, unspecified: Secondary | ICD-10-CM | POA: Diagnosis not present

## 2017-10-09 DIAGNOSIS — G609 Hereditary and idiopathic neuropathy, unspecified: Secondary | ICD-10-CM

## 2017-10-09 DIAGNOSIS — E663 Overweight: Secondary | ICD-10-CM | POA: Diagnosis not present

## 2017-10-09 DIAGNOSIS — L409 Psoriasis, unspecified: Secondary | ICD-10-CM | POA: Diagnosis not present

## 2017-10-09 DIAGNOSIS — R7303 Prediabetes: Secondary | ICD-10-CM | POA: Diagnosis not present

## 2017-10-09 DIAGNOSIS — Z780 Asymptomatic menopausal state: Secondary | ICD-10-CM | POA: Diagnosis not present

## 2017-10-09 DIAGNOSIS — I10 Essential (primary) hypertension: Secondary | ICD-10-CM | POA: Diagnosis not present

## 2017-10-09 DIAGNOSIS — T148XXA Other injury of unspecified body region, initial encounter: Secondary | ICD-10-CM | POA: Insufficient documentation

## 2017-10-09 LAB — CBC
HCT: 38.7 % (ref 36.0–46.0)
HEMOGLOBIN: 13.1 g/dL (ref 12.0–15.0)
MCHC: 33.9 g/dL (ref 30.0–36.0)
MCV: 86.8 fl (ref 78.0–100.0)
Platelets: 250 10*3/uL (ref 150.0–400.0)
RBC: 4.46 Mil/uL (ref 3.87–5.11)
RDW: 14.1 % (ref 11.5–15.5)
WBC: 6.6 10*3/uL (ref 4.0–10.5)

## 2017-10-09 LAB — BASIC METABOLIC PANEL
BUN: 19 mg/dL (ref 6–23)
CALCIUM: 9.7 mg/dL (ref 8.4–10.5)
CHLORIDE: 105 meq/L (ref 96–112)
CO2: 29 mEq/L (ref 19–32)
CREATININE: 0.81 mg/dL (ref 0.40–1.20)
GFR: 74.52 mL/min (ref >60.00)
Glucose, Bld: 138 mg/dL — ABNORMAL HIGH (ref 70–99)
Potassium: 4.5 mEq/L (ref 3.5–5.1)
Sodium: 141 mEq/L (ref 135–145)

## 2017-10-09 LAB — LDL CHOLESTEROL, DIRECT: LDL DIRECT: 65 mg/dL

## 2017-10-09 LAB — HEMOGLOBIN A1C: Hgb A1c MFr Bld: 6.3 % (ref 4.6–6.5)

## 2017-10-09 NOTE — Assessment & Plan Note (Signed)
Very well controlled.  Could consider tapering down on 1 of her medications if remains well controlled and continues to exercise.

## 2017-10-09 NOTE — Assessment & Plan Note (Signed)
Continue Lipitor.  Check LDL. 

## 2017-10-09 NOTE — Assessment & Plan Note (Signed)
Adequately controlled at this time with peppermint well.

## 2017-10-09 NOTE — Assessment & Plan Note (Signed)
Down 8 pounds.  I congratulated her on this.  She will continue diet and exercise.

## 2017-10-09 NOTE — Patient Instructions (Signed)
Nice to see you. We will check lab work today and contact you with the results. Please continue with your current medications as well as diet and exercise.

## 2017-10-09 NOTE — Assessment & Plan Note (Signed)
Suspect this is related to bumping into things given that it just occurs on her arms when she had something.  We will check a CBC to evaluate platelets.

## 2017-10-09 NOTE — Assessment & Plan Note (Signed)
Check A1c. 

## 2017-10-09 NOTE — Progress Notes (Signed)
  [name], MD Phone: [phone]  Molly Gregory is a 67 y.o. female who presents today for f/u.  CC: htn, hld, psoriasis, neuropathy, overweight  HYPERTENSION  Disease Monitoring  Home BP Monitoring 104-124/60-66 Chest pain- no    Dyspnea- no Medications  Compliance-  Taking amlodipine, losartan, propranolol. Lightheadedness-  Rare when standing too quickly  Edema- no  HYPERLIPIDEMIA Symptoms Chest pain on exertion:  no    Medications: Compliance- taking lipitor Right upper quadrant pain- no  Muscle aches- no  Neuropathy: Has some in her toes at times.  She has been using peppermint oil which takes the numbness away.  Has not been taking gabapentin.  Psoriasis: Notes mild rash on her elbows.  She sees dermatology every 6 months.  Continues on [place].  Bruising: Patient does report some bruising on her bilateral lower arms only when she runs into things.  No other bruising issues.  Overweight: She has been doing aerobics and treadmill exercise.  She has been watching what she eats.  Eats mostly fruits and vegetables with some meat.    Social History   Tobacco Use  Smoking Status Never Smoker  Smokeless Tobacco Never Used     ROS see history of present illness  Objective  Physical Exam Vitals:   10/09/17 0807  BP: 126/64  Pulse: (!) 59  Temp: 98.1 F (36.7 C)  SpO2: 98%    BP Readings from Last 3 Encounters:  10/09/17 126/64  05/25/17 110/70  05/15/17 128/64   Wt Readings from Last 3 Encounters:  10/09/17 148 lb 9.6 oz (67.4 kg)  05/25/17 156 lb (70.8 kg)  05/15/17 156 lb (70.8 kg)    Physical Exam  Constitutional: No distress.  Cardiovascular: Normal rate, regular rhythm and normal heart sounds.  Pulmonary/Chest: Effort normal and breath sounds normal.  Musculoskeletal: She exhibits no edema.  Neurological: She is alert.  Skin: Skin is warm and dry. She is not diaphoretic.     Assessment/Plan: Please see individual problem  list.  Essential hypertension, benign Very well controlled.  Could consider tapering down on 1 of her medications if remains well controlled and continues to exercise.  Hereditary and idiopathic peripheral neuropathy Adequately controlled at this time with peppermint well.  Psoriasis Fairly well-controlled.  She will continue to see dermatology.  Overweight (BMI 25.0-29.9) Down 8 pounds.  I congratulated her on this.  She will continue diet and exercise.  Prediabetes Check A1c.  Hyperlipidemia Continue Lipitor.  Check LDL.  Bruising Suspect this is related to bumping into things given that it just occurs on her arms when she had something.  We will check a CBC to evaluate platelets.   Health Maintenance: Discussed hepatitis C screening though she declined.  DEXA scan ordered.  Orders Placed This Encounter  Procedures  . DG Bone Density    Standing Status:   Future    Standing Expiration Date:   12/10/2018    Order Specific Question:   Reason for Exam (SYMPTOM  OR DIAGNOSIS REQUIRED)    Answer:   postmenopausal estrogen deificiency, osteoporosis screening    Order Specific Question:   Preferred imaging location?    Answer:   [facility]  . HgB A1c  . Basic Metabolic Panel (BMET)  . Direct LDL  . CBC    No orders of the defined types were placed in this encounter.    [name], MD [place]

## 2017-10-09 NOTE — Assessment & Plan Note (Signed)
Fairly well-controlled.  She will continue to see dermatology.

## 2017-10-15 ENCOUNTER — Encounter: Payer: Self-pay | Admitting: Family Medicine

## 2017-10-16 NOTE — Telephone Encounter (Signed)
No, Molly Gregory is suppose to be doing these, but since hers is not scheduled yet I will go ahead and get it scheduled. Thanks [employer]

## 2017-10-16 NOTE — Telephone Encounter (Signed)
The 10-year ASCVD risk score Mikey Bussing DC Brooke Bonito., et al., 2013) is: 11.1%   Values used to calculate the score:     Age: 67 years     Sex: Female     Is Non-Hispanic African American: No     Diabetic: No     Tobacco smoker: No     Systolic Blood Pressure: 241 mmHg     Is BP treated: Yes     HDL Cholesterol: 55.3 mg/dL     Total Cholesterol: 206 mg/dL

## 2017-10-28 ENCOUNTER — Ambulatory Visit
Admission: RE | Admit: 2017-10-28 | Discharge: 2017-10-28 | Disposition: A | Discharge status: Home / Self Care | Payer: Medicare Other | Source: Ambulatory Visit | Attending: Family Medicine | Admitting: Family Medicine

## 2017-10-28 DIAGNOSIS — Z23 Encounter for immunization: Secondary | ICD-10-CM | POA: Diagnosis not present

## 2017-10-28 DIAGNOSIS — M8589 Other specified disorders of bone density and structure, multiple sites: Secondary | ICD-10-CM | POA: Diagnosis not present

## 2017-10-28 DIAGNOSIS — Z780 Asymptomatic menopausal state: Secondary | ICD-10-CM | POA: Diagnosis not present

## 2017-11-17 DIAGNOSIS — D485 Neoplasm of uncertain behavior of skin: Secondary | ICD-10-CM | POA: Diagnosis not present

## 2017-11-17 DIAGNOSIS — R208 Other disturbances of skin sensation: Secondary | ICD-10-CM | POA: Diagnosis not present

## 2017-11-17 DIAGNOSIS — C44722 Squamous cell carcinoma of skin of right lower limb, including hip: Secondary | ICD-10-CM | POA: Diagnosis not present

## 2017-11-24 ENCOUNTER — Other Ambulatory Visit: Payer: Self-pay | Admitting: Family Medicine

## 2017-12-21 ENCOUNTER — Other Ambulatory Visit: Payer: Self-pay | Admitting: Family Medicine

## 2017-12-28 DIAGNOSIS — C44722 Squamous cell carcinoma of skin of right lower limb, including hip: Secondary | ICD-10-CM | POA: Diagnosis not present

## 2018-01-11 DIAGNOSIS — T8140XA Infection following a procedure, unspecified, initial encounter: Secondary | ICD-10-CM | POA: Diagnosis not present

## 2018-01-11 DIAGNOSIS — L089 Local infection of the skin and subcutaneous tissue, unspecified: Secondary | ICD-10-CM | POA: Diagnosis not present

## 2018-01-25 ENCOUNTER — Other Ambulatory Visit: Payer: Self-pay | Admitting: Urology

## 2018-01-25 ENCOUNTER — Telehealth: Payer: Self-pay | Admitting: Urology

## 2018-01-25 DIAGNOSIS — N200 Calculus of kidney: Secondary | ICD-10-CM

## 2018-01-25 NOTE — Telephone Encounter (Signed)
Patient has a follow up app on 02-05-18 and she needs an order for a KUB    Thanks, Sharyn Lull

## 2018-01-25 NOTE — Progress Notes (Signed)
Orders in for KUB.  

## 2018-02-03 ENCOUNTER — Ambulatory Visit
Admission: RE | Admit: 2018-02-03 | Discharge: 2018-02-03 | Disposition: A | Discharge status: Home / Self Care | Payer: Medicare Other | Source: Ambulatory Visit | Attending: Urology | Admitting: Urology

## 2018-02-03 DIAGNOSIS — N200 Calculus of kidney: Secondary | ICD-10-CM | POA: Insufficient documentation

## 2018-02-03 DIAGNOSIS — R109 Unspecified abdominal pain: Secondary | ICD-10-CM | POA: Diagnosis not present

## 2018-02-03 DIAGNOSIS — K8020 Calculus of gallbladder without cholecystitis without obstruction: Secondary | ICD-10-CM | POA: Diagnosis not present

## 2018-02-04 NOTE — Progress Notes (Signed)
02/05/2018  11:37 AM   Molly Gregory 11/05/1950 [id number]  Referring provider: Leone Haven, MD [address] [place], [place] [address]  Chief Complaint  Patient presents with  . Follow-up  . KUB report    HPI: Molly Gregory is a 67 yo F returns today for a one year follow up for the evaluation and management of hematuria and right renal stones. Her last visit with [place] was on 02/17/2018.   History of hematuria CT w/o and w on 01/22/2016 which noted nonobstructing right renal stones. No left renal stones. No ureteral or bladder stones. No hydronephrosis.  No renal cortical masses. No urothelial lesions, with limitations as described.  Additional findings include aortic atherosclerosis, cholelithiasis, small sliding hiatal hernia, mild sigmoid diverticulosis and small left uterine fibroid.  She is not a smoker.   She was exposed to second hand smoke.  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  Cystoscopy performed on 02/15/2016 was negative.  UA negative for visit.  She stated that she saw blood in her urine when she was having some back pain.  It was not a large amount, just a few spots.    Today, she reports of mild flank pain and has no other urinary symptoms. Pt is not a smoker but has had second hand exposure. She denies taking NSAIDs. She has not removed her uterus and reports of having an ovarian cyst. She does not know if she has vaginal bleeding or bleeding when urinating. She does not report blood in the toilet, only when wiping one time.  She states she has a light pink tinge on the toilet paper.  Her UA is negative.   Right renal stones Nonobstructing 4 mm and 58mm lower right renal stones. No left renal stones on 01/2016 CT.  KUB taken on 02/17/2017 demonstrated the stone has not changed in size or position.  KUB today, demonstrated the stone has not changed in size or position and presence of gallstones reported.    PMH: Past Medical History:    Diagnosis Date  . Allergy   . Anxiety   . Arthritis   . Diabetes mellitus without complication ([place])   . H/O exercise stress test    normal  . H/O psoriasis   . Head injury, closed 2013  . Hiatal hernia   . History of chicken pox   . Hx of acute bronchitis   . Hx of migraines   . Hyperlipidemia   . Hypertension   . Irregular heart beat     Surgical History: Past Surgical History:  Procedure Laterality Date  . CARPAL TUNNEL RELEASE Bilateral    both hands, Dr. Ranae Pila  . CATARACT EXTRACTION Left   . EYE SURGERY Left    cornea transplant, Dr. Sandra Cockayne  . TRIGGER FINGER RELEASE Bilateral   . TUBAL LIGATION      Home Medications:  Allergies as of 02/05/2018      Reactions   Penicillins       Medication List        Accurate as of 02/05/18 11:37 AM. Always use your most recent med list.          albuterol 108 (90 Base) MCG/ACT inhaler Commonly known as:  PROVENTIL HFA;VENTOLIN HFA Inhale 2 puffs into the lungs every 6 (six) hours as needed for wheezing or shortness of breath.   amLODipine 10 MG tablet Commonly known as:  NORVASC Take 1 tablet (10 mg total) by mouth daily.  Apremilast 30 MG Tabs 30 mg 2 (two) times daily.   atorvastatin 20 MG tablet Commonly known as:  LIPITOR Take 1 tablet (20 mg total) by mouth daily.   CALCIUM + D PO Take by mouth daily.   citalopram 10 MG tablet Commonly known as:  CELEXA TAKE 1 TABLET (10 MG TOTAL) DAILY BY MOUTH.   Ibuprofen 200 MG Caps Take by mouth.   losartan 100 MG tablet Commonly known as:  COZAAR TAKE 1 TABLET (100 MG TOTAL) BY MOUTH DAILY.   propranolol ER 80 MG 24 hr capsule Commonly known as:  INDERAL LA TAKE 1 CAPSULE (80 MG TOTAL) BY MOUTH DAILY.   psyllium 58.6 % packet Commonly known as:  METAMUCIL Take 1 packet by mouth daily.   triamcinolone cream 0.1 % Commonly known as:  KENALOG Apply 1 application topically 2 (two) times daily.   WOMENS MULTIVITAMIN PLUS PO Take by mouth.        Allergies:  Allergies  Allergen Reactions  . Penicillins     Family History: Family History  Problem Relation Age of Onset  . Hypertension Maternal Aunt   . Aneurysm Maternal Aunt   . Kidney failure Maternal Aunt   . Diabetes Maternal Grandmother   . Heart disease Maternal Grandmother   . Hypertension Maternal Grandmother   . Diabetes Paternal Grandmother   . Heart disease Paternal Grandmother   . Hypertension Paternal Grandmother   . Kidney cancer Cousin   . Bladder Cancer Neg Hx     Social History:  reports that she has never smoked. She has never used smokeless tobacco. She reports that she does not drink alcohol or use drugs.  ROS: UROLOGY Frequent Urination?: No Hard to postpone urination?: No Burning/pain with urination?: No Get up at night to urinate?: No Leakage of urine?: No Urine stream starts and stops?: No Trouble starting stream?: No Do you have to strain to urinate?: No Blood in urine?: No Urinary tract infection?: No Sexually transmitted disease?: No Injury to kidneys or bladder?: No Painful intercourse?: No Weak stream?: No Currently pregnant?: No Vaginal bleeding?: No Last menstrual period?: n  Gastrointestinal Nausea?: No Indigestion/heartburn?: No Diarrhea?: No Constipation?: No  Constitutional Fever: No Night sweats?: No Weight loss?: No Fatigue?: No  Skin Skin rash/lesions?: Yes Itching?: No  Eyes Blurred vision?: No Double vision?: No  Ears/Nose/Throat Sore throat?: No Sinus problems?: Yes  Hematologic/Lymphatic Swollen glands?: No Easy bruising?: No  Cardiovascular Leg swelling?: No Chest pain?: No  Respiratory Cough?: No Shortness of breath?: No  Endocrine Excessive thirst?: No  Musculoskeletal Back pain?: No Joint pain?: No  Neurological Headaches?: No Dizziness?: No  Psychologic Depression?: No Anxiety?: No  Physical Exam: BP 127/68 (BP Location: Left Arm, Patient Position: Sitting, Cuff  Size: Normal)   Pulse (!) 59   Ht 5\' 4"  (1.626 m)   Wt 143 lb (64.9 kg)   BMI 24.55 kg/m   Constitutional: Well nourished. Alert and oriented, No acute distress. HEENT: [place] AT, moist mucus membranes. Trachea midline, no masses. Cardiovascular: No clubbing, cyanosis, or edema. Respiratory: Normal respiratory effort, no increased work of breathing. GU: No CVA tenderness.  No bladder fullness or masses.  Atrophic external genitalia, normal pubic hair distribution, no lesions.  Normal urethral meatus, no lesions, no prolapse, no discharge.   No urethral masses, tenderness and/or tenderness. No bladder fullness, tenderness or masses. pale vagina mucosa, poor estrogen effect, no discharge, no lesions, good pelvic support, no cystocele or rectocele noted.  No cervical  motion tenderness.  Uterus is freely mobile and non-fixed.  No adnexal/parametria masses or tenderness noted.  Anus and perineum are without rashes or lesions.   Skin: No rashes, bruises or suspicious lesions.  Neurologic: Grossly intact, no focal deficits, moving all 4 extremities. Psychiatric: Normal mood and affect.   Urinalysis Negative.  See EPIC. I have reviewed the labs.  Pertinent Imaging: CLINICAL DATA:  Follow-up renal calculi  EXAM: ABDOMEN - 1 VIEW  COMPARISON:  02/17/2017  FINDINGS: Scattered large and small bowel gas is noted. Mild retained fecal material is noted. Lower pole right renal stone is again identified and stable measuring approximately 4 mm. Phleboliths are noted within the pelvis. No definitive left renal stones are seen. Gallstones are again noted in the right upper quadrant. Mild degenerative change of the lumbar spine is seen.  IMPRESSION: Stable right renal stone.  Cholelithiasis.   Electronically Signed   By: Molly Gregory M.D.   On: 02/03/2018 12:19  I have independently reviewed the films - ? Small left renal stone  Assessment & Plan:   1. History of hematuria  - she  is not interested in cystoscopy   - today's UA is negative   - RTC in 1 month for UA and discuss treatment options of ESWL and ureteroscopy   - she will report any gross hematuria in the interim   2. Right renal stone  - unchanged on today KUB  - Advised to contact our office or seek treatment in the ED if becomes febrile or pain/ vomiting are difficult control in order to arrange for emergent/urgent intervention   - See above    - RTC in one year for KUB, if UA is negative   Return in about 1 month (around 03/08/2018) for UA.  These notes generated with voice recognition software. I apologize for typographical errors.  Molly Council, PA-C  [facility] [address] [place], [place] [address] [phone]  I, Molly Gregory, am acting as a Education administrator for [employer],  I have reviewed the above documentation for accuracy and completeness, and I agree with the above.    Molly Council, PA-C

## 2018-02-05 ENCOUNTER — Ambulatory Visit (INDEPENDENT_AMBULATORY_CARE_PROVIDER_SITE_OTHER): Payer: Medicare Other | Admitting: Urology

## 2018-02-05 ENCOUNTER — Encounter: Payer: Self-pay | Admitting: Urology

## 2018-02-05 VITALS — BP 127/68 | HR 59 | Ht 64.0 in | Wt 143.0 lb

## 2018-02-05 DIAGNOSIS — Z87448 Personal history of other diseases of urinary system: Secondary | ICD-10-CM

## 2018-02-05 LAB — URINALYSIS, COMPLETE
Bilirubin, UA: NEGATIVE
Glucose, UA: NEGATIVE
KETONES UA: NEGATIVE
Leukocytes, UA: NEGATIVE
Nitrite, UA: NEGATIVE
Protein, UA: NEGATIVE
RBC UA: NEGATIVE
Specific Gravity, UA: <1.005 — ABNORMAL LOW (ref 1.005–1.030)
Urobilinogen, Ur: 0.2 mg/dL (ref 0.2–1.0)
pH, UA: 5.5 (ref 5.0–7.5)

## 2018-02-15 ENCOUNTER — Ambulatory Visit: Payer: Medicare Other | Admitting: Urology

## 2018-02-16 ENCOUNTER — Other Ambulatory Visit: Payer: Self-pay | Admitting: Family Medicine

## 2018-02-25 ENCOUNTER — Other Ambulatory Visit: Payer: Self-pay | Admitting: Family Medicine

## 2018-03-01 DIAGNOSIS — Z961 Presence of intraocular lens: Secondary | ICD-10-CM | POA: Diagnosis not present

## 2018-03-10 ENCOUNTER — Other Ambulatory Visit: Payer: Self-pay | Admitting: Family Medicine

## 2018-03-11 DIAGNOSIS — D2272 Melanocytic nevi of left lower limb, including hip: Secondary | ICD-10-CM | POA: Diagnosis not present

## 2018-03-11 DIAGNOSIS — Z85828 Personal history of other malignant neoplasm of skin: Secondary | ICD-10-CM | POA: Diagnosis not present

## 2018-03-11 DIAGNOSIS — D225 Melanocytic nevi of trunk: Secondary | ICD-10-CM | POA: Diagnosis not present

## 2018-03-11 DIAGNOSIS — D485 Neoplasm of uncertain behavior of skin: Secondary | ICD-10-CM | POA: Diagnosis not present

## 2018-03-11 DIAGNOSIS — L400 Psoriasis vulgaris: Secondary | ICD-10-CM | POA: Diagnosis not present

## 2018-03-11 DIAGNOSIS — D2261 Melanocytic nevi of right upper limb, including shoulder: Secondary | ICD-10-CM | POA: Diagnosis not present

## 2018-03-11 DIAGNOSIS — D2262 Melanocytic nevi of left upper limb, including shoulder: Secondary | ICD-10-CM | POA: Diagnosis not present

## 2018-03-11 DIAGNOSIS — C44722 Squamous cell carcinoma of skin of right lower limb, including hip: Secondary | ICD-10-CM | POA: Diagnosis not present

## 2018-03-11 DIAGNOSIS — D2271 Melanocytic nevi of right lower limb, including hip: Secondary | ICD-10-CM | POA: Diagnosis not present

## 2018-03-11 DIAGNOSIS — Z08 Encounter for follow-up examination after completed treatment for malignant neoplasm: Secondary | ICD-10-CM | POA: Diagnosis not present

## 2018-03-13 NOTE — Progress Notes (Signed)
03/16/2018  12:35 PM   Molly Gregory 04-30-1950 [id number]  Referring provider: Leone Haven, MD [address] [place], [place] [address]  Chief Complaint  Patient presents with  . Hematuria    HPI: Molly Gregory is a 68 y.o. female Caucasian with a history of hematuria and right renal stones presents today for an UA and to discuss treatment options of ESWL and ureteroscopy.  History of hematuria CT w/o and w on 01/22/2016 which noted nonobstructing right renal stones. No left renal stones. No ureteral or bladder stones. No hydronephrosis.  No renal cortical masses. No urothelial lesions, with limitations as described.  Additional findings include aortic atherosclerosis, cholelithiasis, small sliding hiatal hernia, mild sigmoid diverticulosis and small left uterine fibroid.  She is not a smoker.   She was exposed to second hand smoke.  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  Cystoscopy performed on 02/15/2016 was negative.  UA negative for visit.  She stated that she saw blood in her urine when she was having some back pain.  It was not a large amount, just a few spots.   On 02/05/2018 she reported mild flank pain and had no other urinary symptoms. She denied taking NSAIDs. She has not removed her uterus and reportedhaving an ovarian cyst. She did not know if she has vaginal bleeding or bleeding when urinating. She did not report blood in the toilet, only when wiping one time.  She stated she had a light pink tinge on the toilet paper.  Her UA was negative.   She does not report any further gross hematuria.  Her UA today is negative.    Right renal stones Nonobstructing 4 mm and 12mm lower right renal stones. No left renal stones on 01/2016 CT.  KUB taken on 02/17/2017 demonstrated the stone has not changed in size or position.  KUB on 02/03/2018 demonstrated the stone has not changed in size or position and presence of gallstones reported.   PMH: Past  Medical History:  Diagnosis Date  . Allergy   . Anxiety   . Arthritis   . Diabetes mellitus without complication ([place])   . H/O exercise stress test    normal  . H/O psoriasis   . Head injury, closed 2013  . Hiatal hernia   . History of chicken pox   . Hx of acute bronchitis   . Hx of migraines   . Hyperlipidemia   . Hypertension   . Irregular heart beat     Surgical History: Past Surgical History:  Procedure Laterality Date  . CARPAL TUNNEL RELEASE Bilateral    both hands, Dr. Ranae Pila  . CATARACT EXTRACTION Left   . EYE SURGERY Left    cornea transplant, Dr. Sandra Cockayne  . TRIGGER FINGER RELEASE Bilateral   . TUBAL LIGATION      Home Medications:  Allergies as of 03/16/2018      Reactions   Penicillins       Medication List       Accurate as of March 16, 2018 12:35 PM. Always use your most recent med list.        albuterol 108 (90 Base) MCG/ACT inhaler Commonly known as:  PROVENTIL HFA;VENTOLIN HFA Inhale 2 puffs into the lungs every 6 (six) hours as needed for wheezing or shortness of breath.   amLODipine 10 MG tablet Commonly known as:  NORVASC TAKE 1 TABLET BY MOUTH EVERY DAY   Apremilast 30 MG Tabs 30 mg 2 (two)  times daily.   atorvastatin 20 MG tablet Commonly known as:  LIPITOR TAKE 1 TABLET BY MOUTH EVERY DAY   CALCIUM + D PO Take by mouth daily.   citalopram 10 MG tablet Commonly known as:  CELEXA TAKE 1 TABLET (10 MG TOTAL) DAILY BY MOUTH.   Ibuprofen 200 MG Caps Take by mouth.   losartan 100 MG tablet Commonly known as:  COZAAR TAKE 1 TABLET (100 MG TOTAL) BY MOUTH DAILY.   propranolol ER 80 MG 24 hr capsule Commonly known as:  INDERAL LA TAKE 1 CAPSULE (80 MG TOTAL) BY MOUTH DAILY.   psyllium 58.6 % packet Commonly known as:  METAMUCIL Take 1 packet by mouth daily.   triamcinolone cream 0.1 % Commonly known as:  KENALOG Apply 1 application topically 2 (two) times daily.   WOMENS MULTIVITAMIN PLUS PO Take by mouth.        Allergies:  Allergies  Allergen Reactions  . Penicillins     Family History: Family History  Problem Relation Age of Onset  . Hypertension Maternal Aunt   . Aneurysm Maternal Aunt   . Kidney failure Maternal Aunt   . Diabetes Maternal Grandmother   . Heart disease Maternal Grandmother   . Hypertension Maternal Grandmother   . Diabetes Paternal Grandmother   . Heart disease Paternal Grandmother   . Hypertension Paternal Grandmother   . Kidney cancer Cousin   . Bladder Cancer Neg Hx     Social History:  reports that she has never smoked. She has never used smokeless tobacco. She reports that she does not drink alcohol or use drugs.  ROS: UROLOGY Frequent Urination?: No Hard to postpone urination?: No Burning/pain with urination?: No Get up at night to urinate?: No Leakage of urine?: No Urine stream starts and stops?: No Trouble starting stream?: No Do you have to strain to urinate?: No Blood in urine?: No Urinary tract infection?: No Sexually transmitted disease?: No Injury to kidneys or bladder?: No Painful intercourse?: No Weak stream?: No Currently pregnant?: No Vaginal bleeding?: No Last menstrual period?: n  Gastrointestinal Nausea?: No Vomiting?: No Indigestion/heartburn?: No Diarrhea?: No Constipation?: No  Constitutional Fever: No Night sweats?: No Weight loss?: No Fatigue?: No  Skin Skin rash/lesions?: No Itching?: No  Eyes Blurred vision?: No Double vision?: No  Ears/Nose/Throat Sore throat?: No Sinus problems?: Yes  Hematologic/Lymphatic Swollen glands?: No Easy bruising?: No  Cardiovascular Leg swelling?: No Chest pain?: No  Respiratory Cough?: No Shortness of breath?: No  Endocrine Excessive thirst?: No  Musculoskeletal Back pain?: No Joint pain?: No  Neurological Headaches?: No Dizziness?: No  Psychologic Depression?: No Anxiety?: No  Physical Exam: BP (!) 114/58 (BP Location: Left Arm, Patient  Position: Sitting, Cuff Size: Normal)   Pulse 62   Ht 5\' 4"  (1.626 m)   Wt 148 lb 3.2 oz (67.2 kg)   BMI 25.44 kg/m   Constitutional: Well nourished. Alert and oriented, No acute distress. Cardiovascular: No clubbing, cyanosis, or edema. Respiratory: Normal respiratory effort, no increased work of breathing. Skin: No rashes, bruises or suspicious lesions. Neurologic: Grossly intact, no focal deficits, moving all 4 extremities. Psychiatric: Normal mood and affect.   Urinalysis Negative.  See EPIC. I have reviewed the labs.  Assessment & Plan:   Patient was wondering today if she possibly had passed one of her renal stones.  Discussed possible surgical intervention.  Patient is interested in waiting another year before deciding on going ahead with it.  Discussed possibly having cytoscopy or ultrasound;  patient decided to wait.  Blood is associated with right lower back pain, patient suspects it may be associated with the stone moving.  Reminded patient to RTC if she sees blood again, otherwise RTC in 1 year.  1. History of hematuria - She is not interested in cystoscopy or upper tract imaging at this time - advised that blood in the urine can be a symptom of kidney/bladder cancer and she is aware of these risks and will contact us with any other episodes of gross hematuria, but for now she does not want to pursue any studies - Today's UA is negative - She will report any gross hematuria -RTC in one year for UA   2. Right renal stone - RTC in one year for KUB and office visit   Return in about 1 year (around 03/17/2019) for UA, KUB and office visit .  These notes generated with voice recognition software. I apologize for typographical errors.  Zara Council, PA-C  [facility] [address] [place], [place] [address] [phone]  I, Adele Schilder, am acting as a Education administrator for [employer], PA-C.   I have reviewed the above documentation  for accuracy and completeness, and I agree with the above.    Zara Council, PA-C

## 2018-03-15 DIAGNOSIS — N950 Postmenopausal bleeding: Secondary | ICD-10-CM | POA: Insufficient documentation

## 2018-03-16 ENCOUNTER — Encounter: Payer: Self-pay | Admitting: Urology

## 2018-03-16 ENCOUNTER — Ambulatory Visit (INDEPENDENT_AMBULATORY_CARE_PROVIDER_SITE_OTHER): Payer: Medicare Other | Admitting: Urology

## 2018-03-16 VITALS — BP 114/58 | HR 62 | Ht 64.0 in | Wt 148.2 lb

## 2018-03-16 DIAGNOSIS — Z87448 Personal history of other diseases of urinary system: Secondary | ICD-10-CM

## 2018-03-16 DIAGNOSIS — N200 Calculus of kidney: Secondary | ICD-10-CM | POA: Diagnosis not present

## 2018-03-16 LAB — URINALYSIS, COMPLETE
Bilirubin, UA: NEGATIVE
Glucose, UA: NEGATIVE
KETONES UA: NEGATIVE
Leukocytes, UA: NEGATIVE
NITRITE UA: NEGATIVE
Protein, UA: NEGATIVE
SPEC GRAV UA: 1.015 (ref 1.005–1.030)
Urobilinogen, Ur: 0.2 mg/dL (ref 0.2–1.0)
pH, UA: 5 (ref 5.0–7.5)

## 2018-03-16 LAB — MICROSCOPIC EXAMINATION
RBC, UA: NONE SEEN /hpf (ref 0–2)
WBC, UA: NONE SEEN /hpf (ref 0–5)

## 2018-03-26 DIAGNOSIS — Z1231 Encounter for screening mammogram for malignant neoplasm of breast: Secondary | ICD-10-CM | POA: Diagnosis not present

## 2018-03-29 DIAGNOSIS — L923 Foreign body granuloma of the skin and subcutaneous tissue: Secondary | ICD-10-CM | POA: Diagnosis not present

## 2018-04-12 ENCOUNTER — Encounter: Payer: Self-pay | Admitting: Family Medicine

## 2018-04-12 ENCOUNTER — Ambulatory Visit (INDEPENDENT_AMBULATORY_CARE_PROVIDER_SITE_OTHER): Payer: Medicare Other | Admitting: Family Medicine

## 2018-04-12 VITALS — BP 124/70 | HR 57 | Temp 98.4°F | Ht 64.0 in | Wt 144.2 lb

## 2018-04-12 DIAGNOSIS — G44229 Chronic tension-type headache, not intractable: Secondary | ICD-10-CM | POA: Diagnosis not present

## 2018-04-12 DIAGNOSIS — E785 Hyperlipidemia, unspecified: Secondary | ICD-10-CM

## 2018-04-12 DIAGNOSIS — R7303 Prediabetes: Secondary | ICD-10-CM | POA: Diagnosis not present

## 2018-04-12 DIAGNOSIS — R319 Hematuria, unspecified: Secondary | ICD-10-CM

## 2018-04-12 DIAGNOSIS — I10 Essential (primary) hypertension: Secondary | ICD-10-CM

## 2018-04-12 DIAGNOSIS — D0471 Carcinoma in situ of skin of right lower limb, including hip: Secondary | ICD-10-CM | POA: Diagnosis not present

## 2018-04-12 LAB — LIPID PANEL
Cholesterol: 134 mg/dL (ref 0–200)
HDL: 58.8 mg/dL
LDL Cholesterol: 64 mg/dL (ref 0–99)
NonHDL: 74.84
Total CHOL/HDL Ratio: 2
Triglycerides: 54 mg/dL (ref 0.0–149.0)
VLDL: 10.8 mg/dL (ref 0.0–40.0)

## 2018-04-12 LAB — COMPREHENSIVE METABOLIC PANEL WITH GFR
ALT: 21 U/L (ref 0–35)
AST: 24 U/L (ref 0–37)
Albumin: 4.2 g/dL (ref 3.5–5.2)
Alkaline Phosphatase: 68 U/L (ref 39–117)
BUN: 14 mg/dL (ref 6–23)
CO2: 30 meq/L (ref 19–32)
Calcium: 9.7 mg/dL (ref 8.4–10.5)
Chloride: 104 meq/L (ref 96–112)
Creatinine, Ser: 0.65 mg/dL (ref 0.40–1.20)
GFR: 90.24 mL/min
Glucose, Bld: 121 mg/dL — ABNORMAL HIGH (ref 70–99)
Potassium: 4.5 meq/L (ref 3.5–5.1)
Sodium: 141 meq/L (ref 135–145)
Total Bilirubin: 1.1 mg/dL (ref 0.2–1.2)
Total Protein: 6.8 g/dL (ref 6.0–8.3)

## 2018-04-12 LAB — HEMOGLOBIN A1C: Hgb A1c MFr Bld: 6.2 % (ref 4.6–6.5)

## 2018-04-12 NOTE — Assessment & Plan Note (Signed)
Very well controlled.  We will discontinue propranolol.  She will continue on amlodipine and losartan.  She will contact us in 2 weeks with her blood pressure readings and pulse readings.  Check lab work today.

## 2018-04-12 NOTE — Assessment & Plan Note (Signed)
Check lipid panel.  Continue Lipitor. 

## 2018-04-12 NOTE — Patient Instructions (Signed)
Nice to see you. We will have you discontinue your propranolol.  Please monitor your blood pressure and pulse with this.  Please send [place] a message in 2 weeks with your readings. We will check lab work today and contact you with the results. Please continue with diet and exercise.

## 2018-04-12 NOTE — Assessment & Plan Note (Signed)
Likely related to nephrolithiasis.  Urology did discuss the risk of other causes and recommended cystoscopy though the patient has deferred this at this time.  She will continue to monitor and follow-up with urology.

## 2018-04-12 NOTE — Assessment & Plan Note (Signed)
Chronic intermittent issues.  Unchanged.  She will monitor.

## 2018-04-12 NOTE — Progress Notes (Signed)
[name], MD Phone: [phone]  Molly Gregory is a 68 y.o. female who presents today for f/u.  CC: htn, hld, hematuria, SCC  Hypertension: Typically running 110s-120s/50s-60s with a pulse of 53-67.  No chest pain, shortness of breath, or edema.  She is taking amlodipine, losartan, and propranolol.  She has been working on diet and exercise and is doing aerobic videos 45 minutes 6 days a week.  Also cut back significantly on sweet intake.  Hyperlipidemia: No abdominal pain or myalgias.  Taking Lipitor.  Hematuria: Patient recently saw urology.  She continues to have intermittent mild pink tinge to her urine when she wipes after urinating.  This only occurs when she has right flank pain.  No significant gross hematuria.  No vaginal bleeding.  No abdominal pain.  No dysuria.  Squamous cell carcinoma: She had this removed from the right anterior leg.  She plans to have 2 additional areas removed later this month.  She does follow with dermatology.  Chronic headaches: Patient notes a long history of chronic intermittent headaches.  She has had no change to these.  No numbness, weakness, or vision changes.  Typically occurs during the winter when the heat is on.  Patient does note some sadness as her cat passed away yesterday in her arm.  She notes no depression.  Social History   Tobacco Use  Smoking Status Never Smoker  Smokeless Tobacco Never Used     ROS see history of present illness  Objective  Physical Exam Vitals:   04/12/18 0812  BP: 124/70  Pulse: (!) 57  Temp: 98.4 F (36.9 C)  SpO2: 99%    BP Readings from Last 3 Encounters:  04/12/18 124/70  03/16/18 (!) 114/58  02/05/18 127/68   Wt Readings from Last 3 Encounters:  04/12/18 144 lb 3.2 oz (65.4 kg)  03/16/18 148 lb 3.2 oz (67.2 kg)  02/05/18 143 lb (64.9 kg)    Physical Exam Constitutional:      General: She is not in acute distress.    Appearance: She is not diaphoretic.  Cardiovascular:      Rate and Rhythm: Normal rate and regular rhythm.     Heart sounds: Normal heart sounds.  Pulmonary:     Effort: Pulmonary effort is normal.     Breath sounds: Normal breath sounds.  Musculoskeletal:     Right lower leg: No edema.     Left lower leg: No edema.  Skin:    General: Skin is warm and dry.       Neurological:     Mental Status: She is alert.      Assessment/Plan: Please see individual problem list.  Essential hypertension, benign Very well controlled.  We will discontinue propranolol.  She will continue on amlodipine and losartan.  She will contact us in 2 weeks with her blood pressure readings and pulse readings.  Check lab work today.  Squamous cell carcinoma in situ of skin of right lower leg She is scheduled for further resection with Dr. Evorn Gong.  She will keep this appointment.  Hematuria Likely related to nephrolithiasis.  Urology did discuss the risk of other causes and recommended cystoscopy though the patient has deferred this at this time.  She will continue to monitor and follow-up with urology.  Chronic headache Chronic intermittent issues.  Unchanged.  She will monitor.  Prediabetes Check A1c.  Hyperlipidemia Check lipid panel.  Continue Lipitor.    Orders Placed This Encounter  Procedures  . Lipid panel  .  Comp Met (CMET)  . HgB A1c    No orders of the defined types were placed in this encounter.    [name], MD [place]

## 2018-04-12 NOTE — Assessment & Plan Note (Signed)
Check A1c. 

## 2018-04-12 NOTE — Assessment & Plan Note (Addendum)
She is scheduled for further resection with Dr. Evorn Gong.  She will keep this appointment.

## 2018-04-21 DIAGNOSIS — C44722 Squamous cell carcinoma of skin of right lower limb, including hip: Secondary | ICD-10-CM | POA: Diagnosis not present

## 2018-04-21 DIAGNOSIS — D485 Neoplasm of uncertain behavior of skin: Secondary | ICD-10-CM | POA: Diagnosis not present

## 2018-04-29 ENCOUNTER — Encounter: Payer: Self-pay | Admitting: Family Medicine

## 2018-05-03 DIAGNOSIS — T8140XA Infection following a procedure, unspecified, initial encounter: Secondary | ICD-10-CM | POA: Diagnosis not present

## 2018-05-11 DIAGNOSIS — C44722 Squamous cell carcinoma of skin of right lower limb, including hip: Secondary | ICD-10-CM | POA: Diagnosis not present

## 2018-05-11 DIAGNOSIS — C44729 Squamous cell carcinoma of skin of left lower limb, including hip: Secondary | ICD-10-CM | POA: Diagnosis not present

## 2018-06-13 ENCOUNTER — Other Ambulatory Visit: Payer: Self-pay | Admitting: Family Medicine

## 2018-07-22 DIAGNOSIS — D2261 Melanocytic nevi of right upper limb, including shoulder: Secondary | ICD-10-CM | POA: Diagnosis not present

## 2018-07-22 DIAGNOSIS — D2262 Melanocytic nevi of left upper limb, including shoulder: Secondary | ICD-10-CM | POA: Diagnosis not present

## 2018-07-22 DIAGNOSIS — D485 Neoplasm of uncertain behavior of skin: Secondary | ICD-10-CM | POA: Diagnosis not present

## 2018-07-22 DIAGNOSIS — R208 Other disturbances of skin sensation: Secondary | ICD-10-CM | POA: Diagnosis not present

## 2018-07-22 DIAGNOSIS — C44722 Squamous cell carcinoma of skin of right lower limb, including hip: Secondary | ICD-10-CM | POA: Diagnosis not present

## 2018-07-22 DIAGNOSIS — Z85828 Personal history of other malignant neoplasm of skin: Secondary | ICD-10-CM | POA: Diagnosis not present

## 2018-07-22 DIAGNOSIS — L400 Psoriasis vulgaris: Secondary | ICD-10-CM | POA: Diagnosis not present

## 2018-07-22 DIAGNOSIS — D225 Melanocytic nevi of trunk: Secondary | ICD-10-CM | POA: Diagnosis not present

## 2018-08-03 DIAGNOSIS — C44722 Squamous cell carcinoma of skin of right lower limb, including hip: Secondary | ICD-10-CM | POA: Diagnosis not present

## 2018-08-09 ENCOUNTER — Other Ambulatory Visit: Payer: Self-pay | Admitting: Family Medicine

## 2018-08-10 ENCOUNTER — Other Ambulatory Visit: Payer: Self-pay | Admitting: Family Medicine

## 2018-08-11 ENCOUNTER — Other Ambulatory Visit: Payer: Self-pay | Admitting: Family Medicine

## 2018-08-17 DIAGNOSIS — C44722 Squamous cell carcinoma of skin of right lower limb, including hip: Secondary | ICD-10-CM | POA: Diagnosis not present

## 2018-08-31 DIAGNOSIS — C44729 Squamous cell carcinoma of skin of left lower limb, including hip: Secondary | ICD-10-CM | POA: Diagnosis not present

## 2018-08-31 DIAGNOSIS — C44722 Squamous cell carcinoma of skin of right lower limb, including hip: Secondary | ICD-10-CM | POA: Diagnosis not present

## 2018-08-31 DIAGNOSIS — D485 Neoplasm of uncertain behavior of skin: Secondary | ICD-10-CM | POA: Diagnosis not present

## 2018-09-01 DIAGNOSIS — E119 Type 2 diabetes mellitus without complications: Secondary | ICD-10-CM | POA: Diagnosis not present

## 2018-09-01 LAB — HM DIABETES EYE EXAM

## 2018-09-08 ENCOUNTER — Other Ambulatory Visit: Payer: Self-pay | Admitting: Family Medicine

## 2018-09-09 NOTE — Telephone Encounter (Signed)
Losartan 50 mg tablets sent in for the patient to take a total of 100 mg daily.

## 2018-09-10 ENCOUNTER — Encounter: Payer: Self-pay | Admitting: Family Medicine

## 2018-09-23 DIAGNOSIS — L821 Other seborrheic keratosis: Secondary | ICD-10-CM | POA: Diagnosis not present

## 2018-09-23 DIAGNOSIS — C44722 Squamous cell carcinoma of skin of right lower limb, including hip: Secondary | ICD-10-CM | POA: Diagnosis not present

## 2018-09-23 DIAGNOSIS — Z85828 Personal history of other malignant neoplasm of skin: Secondary | ICD-10-CM | POA: Diagnosis not present

## 2018-09-23 DIAGNOSIS — L858 Other specified epidermal thickening: Secondary | ICD-10-CM | POA: Diagnosis not present

## 2018-09-23 DIAGNOSIS — C44729 Squamous cell carcinoma of skin of left lower limb, including hip: Secondary | ICD-10-CM | POA: Diagnosis not present

## 2018-09-23 DIAGNOSIS — L905 Scar conditions and fibrosis of skin: Secondary | ICD-10-CM | POA: Diagnosis not present

## 2018-09-27 ENCOUNTER — Encounter: Payer: Self-pay | Admitting: Family Medicine

## 2018-10-08 ENCOUNTER — Other Ambulatory Visit: Payer: Self-pay

## 2018-10-11 ENCOUNTER — Ambulatory Visit: Payer: Medicare Other | Admitting: Family Medicine

## 2018-10-13 ENCOUNTER — Ambulatory Visit (INDEPENDENT_AMBULATORY_CARE_PROVIDER_SITE_OTHER): Payer: Medicare Other | Admitting: Family Medicine

## 2018-10-13 ENCOUNTER — Other Ambulatory Visit: Payer: Self-pay

## 2018-10-13 ENCOUNTER — Encounter: Payer: Self-pay | Admitting: Family Medicine

## 2018-10-13 VITALS — BP 120/60 | HR 69 | Temp 98.4°F | Ht 64.0 in | Wt 131.6 lb

## 2018-10-13 DIAGNOSIS — R8290 Unspecified abnormal findings in urine: Secondary | ICD-10-CM | POA: Diagnosis not present

## 2018-10-13 DIAGNOSIS — R319 Hematuria, unspecified: Secondary | ICD-10-CM | POA: Diagnosis not present

## 2018-10-13 DIAGNOSIS — E785 Hyperlipidemia, unspecified: Secondary | ICD-10-CM | POA: Diagnosis not present

## 2018-10-13 DIAGNOSIS — I10 Essential (primary) hypertension: Secondary | ICD-10-CM | POA: Diagnosis not present

## 2018-10-13 DIAGNOSIS — F419 Anxiety disorder, unspecified: Secondary | ICD-10-CM

## 2018-10-13 DIAGNOSIS — D0471 Carcinoma in situ of skin of right lower limb, including hip: Secondary | ICD-10-CM

## 2018-10-13 DIAGNOSIS — R7303 Prediabetes: Secondary | ICD-10-CM | POA: Diagnosis not present

## 2018-10-13 LAB — URINALYSIS, MICROSCOPIC ONLY: RBC / HPF: NONE SEEN (ref 0–?)

## 2018-10-13 LAB — POCT URINALYSIS DIPSTICK
Bilirubin, UA: NEGATIVE
Blood, UA: NEGATIVE
Glucose, UA: NEGATIVE
Ketones, UA: POSITIVE
Leukocytes, UA: NEGATIVE
Nitrite, UA: NEGATIVE
Protein, UA: POSITIVE — AB
Spec Grav, UA: 1.025 (ref 1.010–1.025)
Urobilinogen, UA: 0.2 E.U./dL
pH, UA: 5.5 (ref 5.0–8.0)

## 2018-10-13 LAB — COMPREHENSIVE METABOLIC PANEL
ALT: 11 U/L (ref 0–35)
AST: 18 U/L (ref 0–37)
Albumin: 4.1 g/dL (ref 3.5–5.2)
Alkaline Phosphatase: 55 U/L (ref 39–117)
BUN: 9 mg/dL (ref 6–23)
CO2: 28 mEq/L (ref 19–32)
Calcium: 9.5 mg/dL (ref 8.4–10.5)
Chloride: 103 mEq/L (ref 96–112)
Creatinine, Ser: 0.66 mg/dL (ref 0.40–1.20)
GFR: 88.54 mL/min (ref >60.00)
Glucose, Bld: 114 mg/dL — ABNORMAL HIGH (ref 70–99)
Potassium: 3.9 mEq/L (ref 3.5–5.1)
Sodium: 140 mEq/L (ref 135–145)
Total Bilirubin: 0.9 mg/dL (ref 0.2–1.2)
Total Protein: 6.5 g/dL (ref 6.0–8.3)

## 2018-10-13 LAB — HEMOGLOBIN A1C: Hgb A1c MFr Bld: 6.1 % (ref 4.6–6.5)

## 2018-10-13 LAB — LDL CHOLESTEROL, DIRECT: Direct LDL: 83 mg/dL

## 2018-10-13 NOTE — Assessment & Plan Note (Signed)
She will continue to see dermatology.  She will contact them to have them checked the area where she thinks there may be a stitch inside.

## 2018-10-13 NOTE — Patient Instructions (Signed)
Nice to see you. Please continue to monitor your diet and eat healthy.  Please continue to exercise. Please contact your dermatologist to schedule follow-up.

## 2018-10-13 NOTE — Assessment & Plan Note (Signed)
Well-controlled off medications.  She will continue to monitor.

## 2018-10-13 NOTE — Assessment & Plan Note (Signed)
Likely related to nephrolithiasis.  She continues to states she does not want a cystoscopy.  We will recheck her urine.

## 2018-10-13 NOTE — Assessment & Plan Note (Signed)
Check LDL.  If well-controlled she can remain off of Lipitor.

## 2018-10-13 NOTE — Assessment & Plan Note (Signed)
Asymptomatic.  She will continue Celexa.

## 2018-10-13 NOTE — Progress Notes (Signed)
  [name], MD Phone: [phone]  Molly Gregory is a 68 y.o. female who presents today for f/u.  HYPERTENSION  Disease Monitoring  Home BP Monitoring 100-120/52-66 Chest pain- no    Dyspnea- no Medications  Compliance-  No medications.  Edema- no  HYPERLIPIDEMIA Symptoms Chest pain on exertion:  no   Leg claudication:   no Medications: Compliance- no medications Right upper quadrant pain- no  Muscle aches- no Patient has been working on diet and exercise.  She has been doing a plant-based diet and is getting lots of beans for protein.  She exercises 5 days a week as well.  Anxiety/depression: She denies depression and anxiety symptoms.  She continues on Celexa.  Squamous cell carcinoma: She had 2 removed from her tibial area by a Mohs surgeon.  These are healing well.  She does have one area that her dermatologist removed where she thinks there may still be a stitch in place underneath the skin.  Kidney stone: She thinks this is still in place.  She does not believe she has passed this.  She has not noticed any nocturia.  Social History   Tobacco Use  Smoking Status Never Smoker  Smokeless Tobacco Never Used     ROS see history of present illness  Objective  Physical Exam Vitals:   10/13/18 0839  BP: 120/60  Pulse: 69  Temp: 98.4 F (36.9 C)  SpO2: 98%    BP Readings from Last 3 Encounters:  10/13/18 120/60  04/12/18 124/70  03/16/18 (!) 114/58   Wt Readings from Last 3 Encounters:  10/13/18 131 lb 9.6 oz (59.7 kg)  04/12/18 144 lb 3.2 oz (65.4 kg)  03/16/18 148 lb 3.2 oz (67.2 kg)    Physical Exam Constitutional:      General: She is not in acute distress.    Appearance: She is not diaphoretic.  Cardiovascular:     Rate and Rhythm: Normal rate and regular rhythm.     Heart sounds: Normal heart sounds.  Pulmonary:     Effort: Pulmonary effort is normal.     Breath sounds: Normal breath sounds.  Musculoskeletal:     Right lower leg: No  edema.     Left lower leg: No edema.  Skin:    General: Skin is warm and dry.     Comments: Well healing sites on her bilateral lower legs  Neurological:     Mental Status: She is alert.      Assessment/Plan: Please see individual problem list.  Essential hypertension, benign Well-controlled off medications.  She will continue to monitor.  Squamous cell carcinoma in situ of skin of right lower leg She will continue to see dermatology.  She will contact them to have them checked the area where she thinks there may be a stitch inside.  Anxiety Asymptomatic.  She will continue Celexa.  Hematuria Likely related to nephrolithiasis.  She continues to states she does not want a cystoscopy.  We will recheck her urine.  Hyperlipidemia Check LDL.  If well-controlled she can remain off of Lipitor.   Orders Placed This Encounter  Procedures  . Direct LDL  . Comp Met (CMET)  . HgB A1c  . POCT Urinalysis Dipstick    No orders of the defined types were placed in this encounter.    [name], MD [place]

## 2018-10-14 ENCOUNTER — Other Ambulatory Visit: Payer: Self-pay | Admitting: Family Medicine

## 2018-10-14 DIAGNOSIS — R809 Proteinuria, unspecified: Secondary | ICD-10-CM

## 2018-10-25 DIAGNOSIS — D0471 Carcinoma in situ of skin of right lower limb, including hip: Secondary | ICD-10-CM | POA: Diagnosis not present

## 2018-10-25 DIAGNOSIS — D485 Neoplasm of uncertain behavior of skin: Secondary | ICD-10-CM | POA: Diagnosis not present

## 2018-10-26 ENCOUNTER — Other Ambulatory Visit: Payer: Self-pay

## 2018-10-26 ENCOUNTER — Other Ambulatory Visit: Payer: Medicare Other

## 2018-10-26 DIAGNOSIS — R809 Proteinuria, unspecified: Secondary | ICD-10-CM | POA: Diagnosis not present

## 2018-10-27 LAB — PROTEIN / CREATININE RATIO, URINE
Creatinine, Urine: 9 mg/dL — ABNORMAL LOW (ref 20–275)
Total Protein, Urine: <4 mg/dL — ABNORMAL LOW (ref 5–24)

## 2018-11-09 DIAGNOSIS — Z23 Encounter for immunization: Secondary | ICD-10-CM | POA: Diagnosis not present

## 2018-11-10 ENCOUNTER — Other Ambulatory Visit: Payer: Self-pay | Admitting: Family Medicine

## 2018-12-09 DIAGNOSIS — D0471 Carcinoma in situ of skin of right lower limb, including hip: Secondary | ICD-10-CM | POA: Diagnosis not present

## 2018-12-30 DIAGNOSIS — D2271 Melanocytic nevi of right lower limb, including hip: Secondary | ICD-10-CM | POA: Diagnosis not present

## 2018-12-30 DIAGNOSIS — L570 Actinic keratosis: Secondary | ICD-10-CM | POA: Diagnosis not present

## 2018-12-30 DIAGNOSIS — L400 Psoriasis vulgaris: Secondary | ICD-10-CM | POA: Diagnosis not present

## 2018-12-30 DIAGNOSIS — D2261 Melanocytic nevi of right upper limb, including shoulder: Secondary | ICD-10-CM | POA: Diagnosis not present

## 2018-12-30 DIAGNOSIS — D2262 Melanocytic nevi of left upper limb, including shoulder: Secondary | ICD-10-CM | POA: Diagnosis not present

## 2018-12-30 DIAGNOSIS — X32XXXA Exposure to sunlight, initial encounter: Secondary | ICD-10-CM | POA: Diagnosis not present

## 2019-02-14 ENCOUNTER — Ambulatory Visit
Admission: RE | Admit: 2019-02-14 | Discharge: 2019-02-14 | Disposition: A | Discharge status: Home / Self Care | Payer: Medicare Other | Source: Ambulatory Visit | Attending: Urology | Admitting: Urology

## 2019-02-14 ENCOUNTER — Other Ambulatory Visit: Payer: Self-pay

## 2019-02-14 DIAGNOSIS — N200 Calculus of kidney: Secondary | ICD-10-CM | POA: Diagnosis not present

## 2019-02-14 NOTE — Progress Notes (Signed)
03/16/2018  8:41 AM   Molly Gregory 1950/04/26 [id number]  Referring provider: Leone Haven, MD [address] [place],  [place] [address]  Chief Complaint  Patient presents with  . Follow-up    HPI: Molly Gregory is a 68 y.o. female with a history of hematuria and right renal stones presents today for annual follow-up.  History of hematuria (high risk) Non-smoker.  CT w/o and w on 01/22/2016 which noted nonobstructing right renal stones. No left renal stones. No ureteral or bladder stones. No hydronephrosis.  No renal cortical masses. No urothelial lesions, with limitations as described.  Additional findings include aortic atherosclerosis, cholelithiasis, small sliding hiatal hernia, mild sigmoid diverticulosis and small left uterine fibroid.  Cystoscopy performed on 02/15/2016 was negative.  She denies hematuria in the past year.  Right renal stones CT w/wo 01/2016 nonobstructing 4 mm and 33mm lower right renal stones. No left renal stones.  KUB 02/17/2017 demonstrated the stone has not changed in size or position.  KUB 02/03/2018 demonstrated the stone has not changed in size or position and presence of gallstones reported.  KUB 02/13/2019 Unchanged 4 mm lower pole right renal calculus.  Cholecystolithiasis.  PMH: Past Medical History:  Diagnosis Date  . Allergy   . Anxiety   . Arthritis   . Diabetes mellitus without complication ([place])   . H/O exercise stress test    normal  . H/O psoriasis   . Head injury, closed 2013  . Hiatal hernia   . History of chicken pox   . Hx of acute bronchitis   . Hx of migraines   . Hyperlipidemia   . Hypertension   . Irregular heart beat     Surgical History: Past Surgical History:  Procedure Laterality Date  . CARPAL TUNNEL RELEASE Bilateral    both hands, Dr. Ranae Pila  . CATARACT EXTRACTION Left   . EYE SURGERY Left    cornea transplant, Dr. Sandra Cockayne  . TRIGGER FINGER RELEASE Bilateral   . TUBAL LIGATION        Home Medications:  Allergies as of 02/15/2019      Reactions   Penicillins       Medication List       Accurate as of February 15, 2019  8:41 AM. If you have any questions, ask your nurse or doctor.        albuterol 108 (90 Base) MCG/ACT inhaler Commonly known as: VENTOLIN HFA Inhale 2 puffs into the lungs every 6 (six) hours as needed for wheezing or shortness of breath.   amLODipine 10 MG tablet Commonly known as: NORVASC TAKE 1 TABLET BY MOUTH EVERY DAY   Apremilast 30 MG Tabs 30 mg 2 (two) times daily.   atorvastatin 20 MG tablet Commonly known as: LIPITOR TAKE 1 TABLET BY MOUTH EVERY DAY   CALCIUM + D PO Take by mouth daily.   citalopram 10 MG tablet Commonly known as: CELEXA TAKE 1 TABLET (10 MG TOTAL) DAILY BY MOUTH.   Ibuprofen 200 MG Caps Take by mouth.   losartan 50 MG tablet Commonly known as: COZAAR Take 2 tablets (100 mg total) by mouth daily.   propranolol ER 80 MG 24 hr capsule Commonly known as: INDERAL LA TAKE 1 CAPSULE (80 MG TOTAL) BY MOUTH DAILY.   psyllium 58.6 % packet Commonly known as: METAMUCIL Take 1 packet by mouth daily.   triamcinolone cream 0.1 % Commonly known as: KENALOG Apply 1 application topically 2 (two) times daily.  WOMENS MULTIVITAMIN PLUS PO Take by mouth.       Allergies:  Allergies  Allergen Reactions  . Penicillins     Family History: Family History  Problem Relation Age of Onset  . Hypertension Maternal Aunt   . Aneurysm Maternal Aunt   . Kidney failure Maternal Aunt   . Diabetes Maternal Grandmother   . Heart disease Maternal Grandmother   . Hypertension Maternal Grandmother   . Diabetes Paternal Grandmother   . Heart disease Paternal Grandmother   . Hypertension Paternal Grandmother   . Kidney cancer Cousin   . Bladder Cancer Neg Hx     Social History:  reports that she has never smoked. She has never used smokeless tobacco. She reports that she does not drink alcohol or use  drugs.  ROS: UROLOGY Frequent Urination?: No Hard to postpone urination?: No Burning/pain with urination?: No Get up at night to urinate?: No Leakage of urine?: No Urine stream starts and stops?: No Trouble starting stream?: No Do you have to strain to urinate?: No Blood in urine?: No Urinary tract infection?: No Sexually transmitted disease?: No Injury to kidneys or bladder?: No Painful intercourse?: No Weak stream?: No Currently pregnant?: No Vaginal bleeding?: No Last menstrual period?: n  Gastrointestinal Nausea?: No Vomiting?: No Indigestion/heartburn?: No Diarrhea?: No Constipation?: No  Constitutional Fever: No Night sweats?: No Weight loss?: No Fatigue?: No  Skin Skin rash/lesions?: No Itching?: No  Eyes Blurred vision?: No Double vision?: No  Ears/Nose/Throat Sore throat?: No Sinus problems?: No  Hematologic/Lymphatic Swollen glands?: No Easy bruising?: No  Cardiovascular Leg swelling?: No Chest pain?: No  Respiratory Cough?: No Shortness of breath?: No  Endocrine Excessive thirst?: No  Musculoskeletal Back pain?: No Joint pain?: No  Neurological Headaches?: No Dizziness?: No  Psychologic Depression?: No Anxiety?: No  Physical Exam: BP 132/61   Pulse 74   Ht 5\' 4"  (1.626 m)   Wt 117 lb (53.1 kg)   BMI 20.08 kg/m   Constitutional:  Well nourished. Alert and oriented, No acute distress. HEENT: [place] AT, moist mucus membranes.  Trachea midline, no masses. Cardiovascular: No clubbing, cyanosis, or edema. Respiratory: Normal respiratory effort, no increased work of breathing. Skin: No rashes, bruises or suspicious lesions. Neurologic: Grossly intact, no focal deficits, moving all 4 extremities. Psychiatric: Normal mood and affect.   Urinalysis Results for orders placed or performed in visit on 02/15/19  Microscopic Examination   URINE  Result Value Ref Range   WBC, UA 0-5 0 - 5 /hpf   RBC None seen 0 - 2 /hpf    Epithelial Cells (non renal) None seen 0 - 10 /hpf   Bacteria, UA None seen None seen/Few  Urinalysis, Complete  Result Value Ref Range   Specific Gravity, UA 1.015 1.005 - 1.030   pH, UA 5.0 5.0 - 7.5   Color, UA Yellow Yellow   Appearance Ur Clear Clear   Leukocytes,UA Negative Negative   Protein,UA Negative Negative/Trace   Glucose, UA Negative Negative   Ketones, UA Negative Negative   RBC, UA Trace (A) Negative   Bilirubin, UA Negative Negative   Urobilinogen, Ur 0.2 0.2 - 1.0 mg/dL   Nitrite, UA Negative Negative   Microscopic Examination See below:     I have reviewed the labs.  Pertinent Imaging CLINICAL DATA:  Flank pain.  EXAM: ABDOMEN - 1 VIEW  COMPARISON:  Abdominal radiographs 02/03/2018  FINDINGS: No dilated loops of bowel are demonstrated to suggest bowel obstruction.  Redemonstrated cholecystolithiasis.  Unchanged 4 mm lower pole right renal calculus. No definite left renal calculus.  Multiple small densities also project over the bilateral pelvis, likely reflecting phleboliths.  No acute bony abnormality. Degenerative change of the lower lumbar spine with lumbar levocurvature.  IMPRESSION: Unchanged 4 mm lower pole right renal calculus.  Cholecystolithiasis.   Electronically Signed   By: Kellie Simmering DO   On: 02/14/2019 08:49  I personally reviewed the imaging above and note an unchanged 4 mm right lower pole renal calculus.  Assessment & Plan:    1. History of hematuria Hematuria work up completed in 01/2016  - findings positive for stones No report of gross hematuria in the past year UA today pan-negative RTC in one year for UA - patient to report any gross hematuria in the interim    2. Right renal stone Unchanged per KUB today Patient asymptomatic RTC in one year for KUB and office visit   Return in about 1 year (around 02/15/2020) for Annual hematuria + stone follow-up with UA and KUB prior.  These notes generated  with voice recognition software. I apologize for typographical errors.  Debroah Loop, PA-C  [facility] [address] [place], [place] [address] [phone]

## 2019-02-15 ENCOUNTER — Encounter: Payer: Self-pay | Admitting: Physician Assistant

## 2019-02-15 ENCOUNTER — Ambulatory Visit (INDEPENDENT_AMBULATORY_CARE_PROVIDER_SITE_OTHER): Payer: Medicare Other | Admitting: Physician Assistant

## 2019-02-15 VITALS — BP 132/61 | HR 74 | Ht 64.0 in | Wt 117.0 lb

## 2019-02-15 DIAGNOSIS — Z87448 Personal history of other diseases of urinary system: Secondary | ICD-10-CM | POA: Diagnosis not present

## 2019-02-15 DIAGNOSIS — N200 Calculus of kidney: Secondary | ICD-10-CM | POA: Diagnosis not present

## 2019-02-15 LAB — MICROSCOPIC EXAMINATION
Bacteria, UA: NONE SEEN
Epithelial Cells (non renal): NONE SEEN /hpf (ref 0–10)
RBC, Urine: NONE SEEN /hpf (ref 0–2)

## 2019-02-15 LAB — URINALYSIS, COMPLETE
Bilirubin, UA: NEGATIVE
Glucose, UA: NEGATIVE
Ketones, UA: NEGATIVE
Leukocytes,UA: NEGATIVE
Nitrite, UA: NEGATIVE
Protein,UA: NEGATIVE
Specific Gravity, UA: 1.015 (ref 1.005–1.030)
Urobilinogen, Ur: 0.2 mg/dL (ref 0.2–1.0)
pH, UA: 5 (ref 5.0–7.5)

## 2019-02-18 ENCOUNTER — Other Ambulatory Visit: Payer: Self-pay | Admitting: Family Medicine

## 2019-02-25 IMAGING — CR DG ABDOMEN 1V
1 series · 2 of 2 positions shown · non-contrast
Comparison: 01/22/2016 abdominal CT

CLINICAL DATA: Kidney stones

EXAM:
ABDOMEN - 1 VIEW

[Series 1: dg abd 1 view · 0.14mm/px · 2 of 2 slices shown]
[im 1/2]
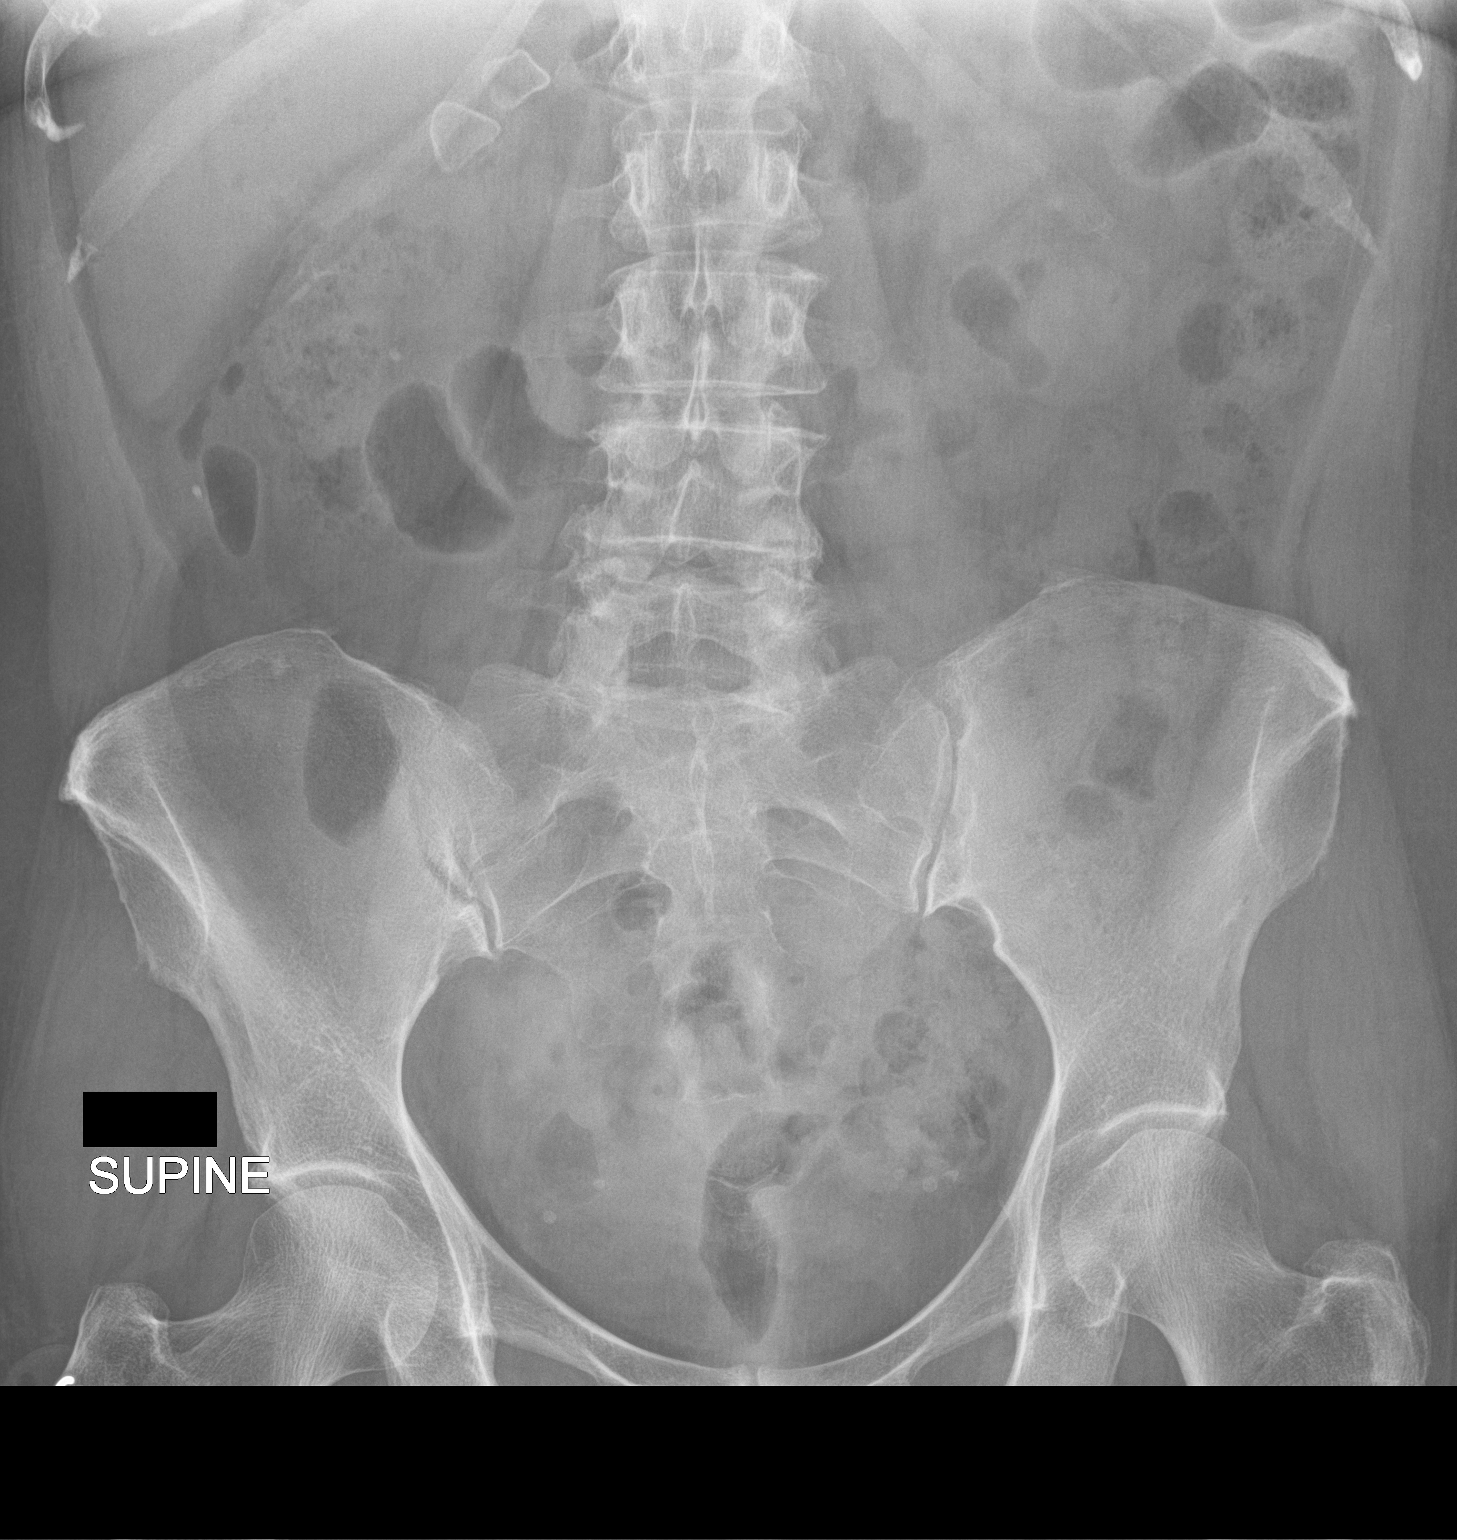
[im 2/2]
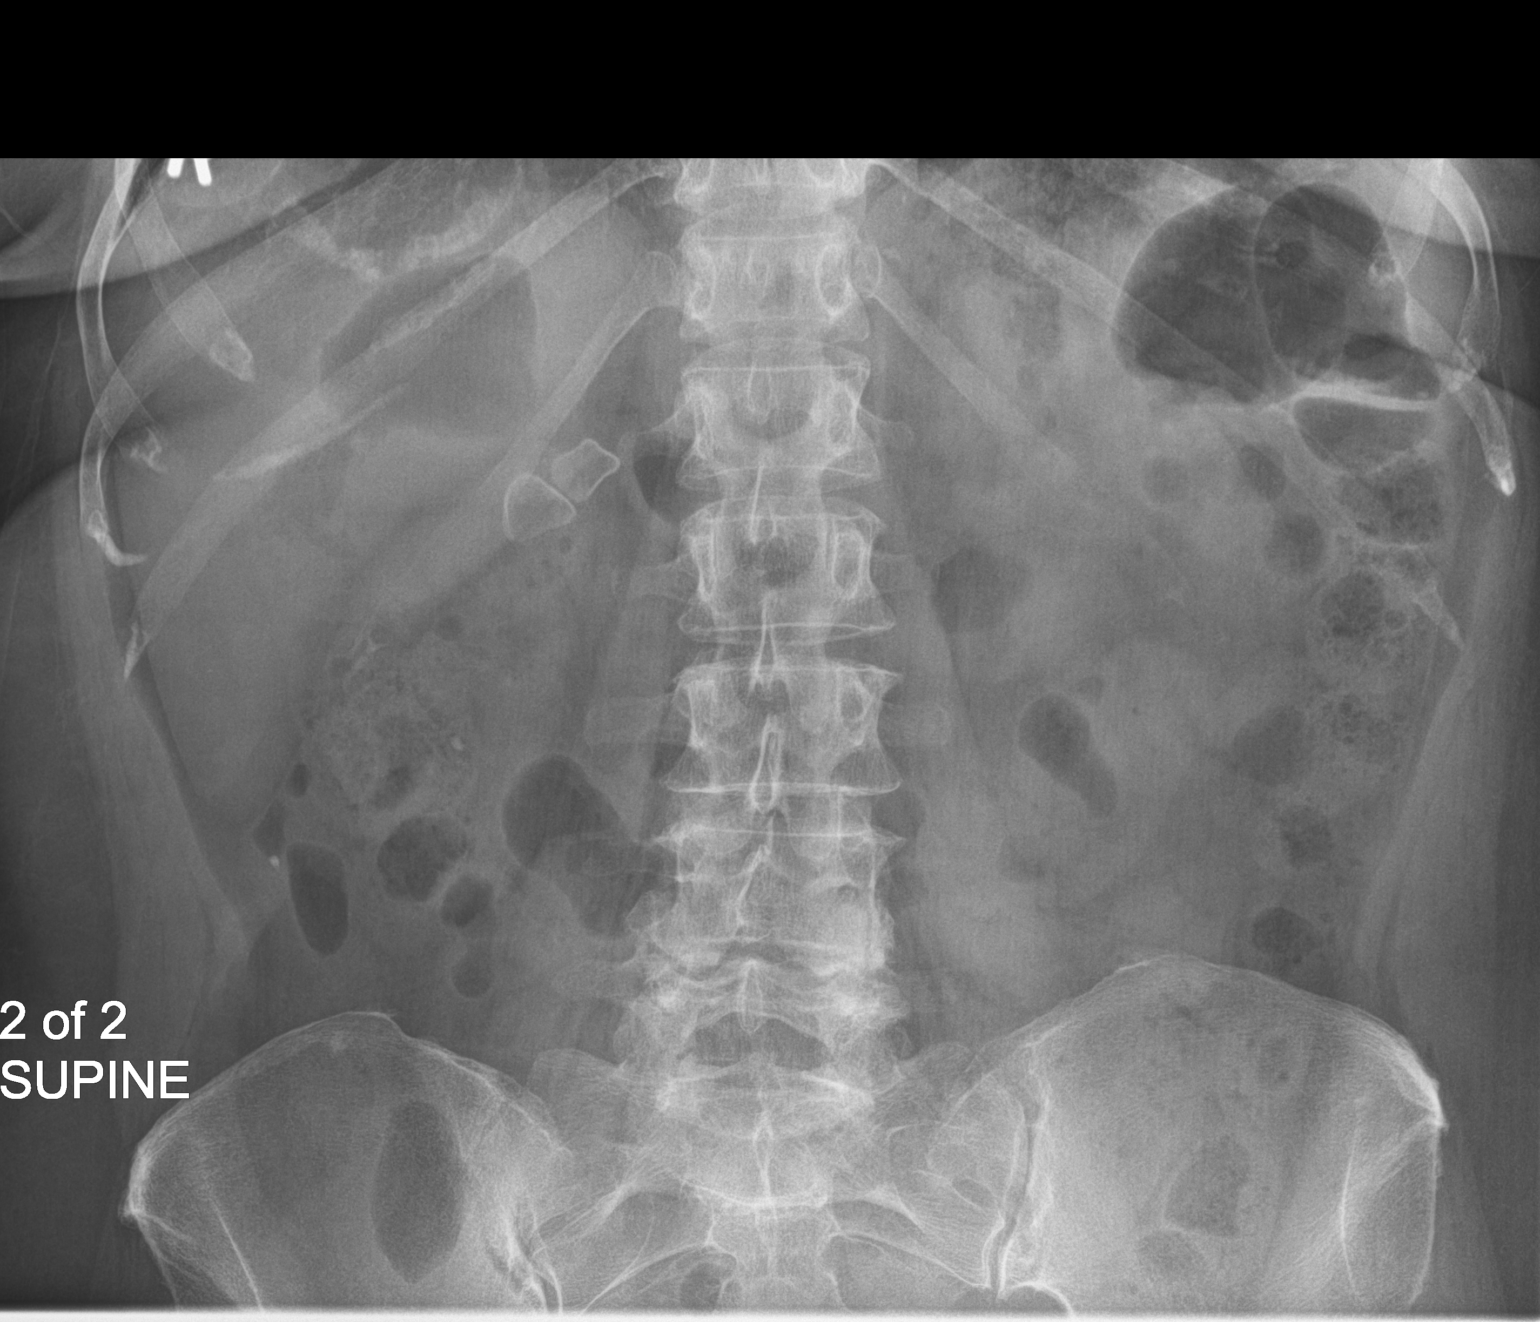

[2 of 2 positions shown; findings below may reference images not displayed]

FINDINGS: 3 mm stone over the lower right kidney, stable from previous CT.
Accounting for phleboliths, no evidence of ureteral calculus.

Cholelithiasis with 2 faceted gallstones noted. Normal bowel gas
pattern.

Calcified granuloma at the right lung base.
IMPRESSION: 1. 3 mm right renal calculus, stable from 01/22/2016 CT.
2. Cholelithiasis.

## 2019-03-16 ENCOUNTER — Other Ambulatory Visit: Payer: Self-pay | Admitting: Family Medicine

## 2019-03-29 DIAGNOSIS — Z1231 Encounter for screening mammogram for malignant neoplasm of breast: Secondary | ICD-10-CM | POA: Diagnosis not present

## 2019-03-29 LAB — HM MAMMOGRAPHY

## 2019-05-03 DIAGNOSIS — L400 Psoriasis vulgaris: Secondary | ICD-10-CM | POA: Diagnosis not present

## 2019-05-03 DIAGNOSIS — D2262 Melanocytic nevi of left upper limb, including shoulder: Secondary | ICD-10-CM | POA: Diagnosis not present

## 2019-05-03 DIAGNOSIS — D2271 Melanocytic nevi of right lower limb, including hip: Secondary | ICD-10-CM | POA: Diagnosis not present

## 2019-05-03 DIAGNOSIS — D225 Melanocytic nevi of trunk: Secondary | ICD-10-CM | POA: Diagnosis not present

## 2019-05-03 DIAGNOSIS — Z85828 Personal history of other malignant neoplasm of skin: Secondary | ICD-10-CM | POA: Diagnosis not present

## 2019-05-03 DIAGNOSIS — D485 Neoplasm of uncertain behavior of skin: Secondary | ICD-10-CM | POA: Diagnosis not present

## 2019-05-03 DIAGNOSIS — D2272 Melanocytic nevi of left lower limb, including hip: Secondary | ICD-10-CM | POA: Diagnosis not present

## 2019-05-03 DIAGNOSIS — L821 Other seborrheic keratosis: Secondary | ICD-10-CM | POA: Diagnosis not present

## 2019-05-03 DIAGNOSIS — C44729 Squamous cell carcinoma of skin of left lower limb, including hip: Secondary | ICD-10-CM | POA: Diagnosis not present

## 2019-06-28 DIAGNOSIS — C44729 Squamous cell carcinoma of skin of left lower limb, including hip: Secondary | ICD-10-CM | POA: Diagnosis not present

## 2019-08-10 DIAGNOSIS — S51011A Laceration without foreign body of right elbow, initial encounter: Secondary | ICD-10-CM | POA: Diagnosis not present

## 2019-08-10 DIAGNOSIS — S9002XA Contusion of left ankle, initial encounter: Secondary | ICD-10-CM | POA: Diagnosis not present

## 2019-08-10 DIAGNOSIS — S8002XA Contusion of left knee, initial encounter: Secondary | ICD-10-CM | POA: Diagnosis not present

## 2019-08-10 DIAGNOSIS — W010XXA Fall on same level from slipping, tripping and stumbling without subsequent striking against object, initial encounter: Secondary | ICD-10-CM | POA: Diagnosis not present

## 2019-08-10 DIAGNOSIS — S80211A Abrasion, right knee, initial encounter: Secondary | ICD-10-CM | POA: Diagnosis not present

## 2019-08-12 ENCOUNTER — Other Ambulatory Visit: Payer: Self-pay | Admitting: Family Medicine

## 2019-09-12 LAB — HM DIABETES EYE EXAM

## 2019-10-04 DIAGNOSIS — L400 Psoriasis vulgaris: Secondary | ICD-10-CM | POA: Diagnosis not present

## 2019-10-04 DIAGNOSIS — L910 Hypertrophic scar: Secondary | ICD-10-CM | POA: Diagnosis not present

## 2019-10-04 DIAGNOSIS — Z85828 Personal history of other malignant neoplasm of skin: Secondary | ICD-10-CM | POA: Diagnosis not present

## 2019-10-04 DIAGNOSIS — L720 Epidermal cyst: Secondary | ICD-10-CM | POA: Diagnosis not present

## 2019-10-04 DIAGNOSIS — L821 Other seborrheic keratosis: Secondary | ICD-10-CM | POA: Diagnosis not present

## 2019-10-04 DIAGNOSIS — Z08 Encounter for follow-up examination after completed treatment for malignant neoplasm: Secondary | ICD-10-CM | POA: Diagnosis not present

## 2019-10-17 ENCOUNTER — Encounter: Payer: Self-pay | Admitting: Family Medicine

## 2019-10-17 ENCOUNTER — Ambulatory Visit (INDEPENDENT_AMBULATORY_CARE_PROVIDER_SITE_OTHER): Payer: Medicare Other | Admitting: Family Medicine

## 2019-10-17 ENCOUNTER — Other Ambulatory Visit: Payer: Self-pay

## 2019-10-17 VITALS — BP 130/70 | HR 68 | Temp 98.2°F | Ht 64.0 in | Wt 114.0 lb

## 2019-10-17 DIAGNOSIS — R7303 Prediabetes: Secondary | ICD-10-CM

## 2019-10-17 DIAGNOSIS — N200 Calculus of kidney: Secondary | ICD-10-CM

## 2019-10-17 DIAGNOSIS — E785 Hyperlipidemia, unspecified: Secondary | ICD-10-CM

## 2019-10-17 DIAGNOSIS — I10 Essential (primary) hypertension: Secondary | ICD-10-CM

## 2019-10-17 DIAGNOSIS — R319 Hematuria, unspecified: Secondary | ICD-10-CM

## 2019-10-17 LAB — COMPREHENSIVE METABOLIC PANEL
ALT: 17 U/L (ref 0–35)
AST: 21 U/L (ref 0–37)
Albumin: 4.1 g/dL (ref 3.5–5.2)
Alkaline Phosphatase: 59 U/L (ref 39–117)
BUN: 19 mg/dL (ref 6–23)
CO2: 31 mEq/L (ref 19–32)
Calcium: 9.6 mg/dL (ref 8.4–10.5)
Chloride: 105 mEq/L (ref 96–112)
Creatinine, Ser: 0.63 mg/dL (ref 0.40–1.20)
GFR: 93.15 mL/min (ref >60.00)
Glucose, Bld: 115 mg/dL — ABNORMAL HIGH (ref 70–99)
Potassium: 4.6 mEq/L (ref 3.5–5.1)
Sodium: 142 mEq/L (ref 135–145)
Total Bilirubin: 0.9 mg/dL (ref 0.2–1.2)
Total Protein: 6.5 g/dL (ref 6.0–8.3)

## 2019-10-17 LAB — POCT URINALYSIS DIPSTICK
Bilirubin, UA: NEGATIVE
Blood, UA: NEGATIVE
Glucose, UA: NEGATIVE
Ketones, UA: POSITIVE
Leukocytes, UA: NEGATIVE
Nitrite, UA: NEGATIVE
Protein, UA: NEGATIVE
Spec Grav, UA: <=1.005 — AB (ref 1.010–1.025)
Urobilinogen, UA: 0.2 E.U./dL
pH, UA: 7 (ref 5.0–8.0)

## 2019-10-17 LAB — LIPID PANEL
Cholesterol: 152 mg/dL (ref 0–200)
HDL: 75.4 mg/dL (ref >39.00)
LDL Cholesterol: 66 mg/dL (ref 0–99)
NonHDL: 76.44
Total CHOL/HDL Ratio: 2
Triglycerides: 53 mg/dL (ref 0.0–149.0)
VLDL: 10.6 mg/dL (ref 0.0–40.0)

## 2019-10-17 LAB — HEMOGLOBIN A1C: Hgb A1c MFr Bld: 6 % (ref 4.6–6.5)

## 2019-10-17 NOTE — Assessment & Plan Note (Signed)
No recurrent symptoms.  Check urine.

## 2019-10-17 NOTE — Progress Notes (Addendum)
  [name], MD Phone: [phone]  Molly Gregory is a 69 y.o. female who presents today for f/u.  HYPERTENSION  Disease Monitoring  Home BP Monitoring 496P systolic Chest pain- no    Dyspnea- no Medications  Compliance-  Taking amlodipine, propranolol.   Edema- no  HYPERLIPIDEMIA Symptoms Chest pain on exertion:  no   Medications: Compliance- taking lipitor Right upper quadrant pain- no  Muscle aches- no  PreDM: No polyuria polydipsia.  She exercises daily with workout.  She eats mostly plant-based diet.  Kidney stones: Patient would like a urine tested today.  She has not had any pain or hematuria.  No dysuria.  No fevers.      Social History   Tobacco Use  Smoking Status Never Smoker  Smokeless Tobacco Never Used     ROS see history of present illness  Objective  Physical Exam Vitals:   10/17/19 0833 10/17/19 0851  BP: 140/70 130/70  Pulse: 68   Temp: 98.2 F (36.8 C)   SpO2: 99%     BP Readings from Last 3 Encounters:  10/17/19 130/70  02/15/19 132/61  10/13/18 120/60   Wt Readings from Last 3 Encounters:  10/17/19 114 lb (51.7 kg)  02/15/19 117 lb (53.1 kg)  10/13/18 131 lb 9.6 oz (59.7 kg)    Physical Exam Constitutional:      General: She is not in acute distress.    Appearance: She is not diaphoretic.  Cardiovascular:     Rate and Rhythm: Normal rate and regular rhythm.     Heart sounds: Normal heart sounds.  Pulmonary:     Effort: Pulmonary effort is normal.     Breath sounds: Normal breath sounds.  Abdominal:     General: Bowel sounds are normal. There is no distension.     Palpations: Abdomen is soft.     Tenderness: There is no abdominal tenderness. There is no guarding or rebound.  Musculoskeletal:     Right lower leg: No edema.     Left lower leg: No edema.  Skin:    General: Skin is warm and dry.  Neurological:     Mental Status: She is alert.      Assessment/Plan: Please see individual problem  list.  Essential hypertension, benign Well-controlled at home.  Continue current regimen.  Lab work as outlined below.  Hematuria No recurrent symptoms.  Check urine.  Hyperlipidemia Continue Lipitor.  Check lipid panel.  Prediabetes Encouraged continued diet and exercise.  Check A1c.   Health maintenance: Encouraged patient to the COVID-19 vaccine.  Discussed safety of the vaccines.  Advised she could get these at the pharmacy.  Discussed that the people that are typically being hospitalized or dying from Covid currently are those who are unvaccinated.  Discussed the best way to protect her grandchildren who are under the age of 78 is for her to get vaccinated.  Orders Placed This Encounter  Procedures  . Lipid panel  . Comp Met (CMET)  . HgB A1c  . POCT Urinalysis Dipstick    No orders of the defined types were placed in this encounter.   This visit occurred during the SARS-CoV-2 public health emergency.  Safety protocols were in place, including screening questions prior to the visit, additional usage of staff PPE, and extensive cleaning of exam room while observing appropriate contact time as indicated for disinfecting solutions.    [name], MD [place]

## 2019-10-17 NOTE — Patient Instructions (Signed)
Nice to see you. We will get lab work today and contact you with the results. Please continue with diet and exercise. 

## 2019-10-17 NOTE — Assessment & Plan Note (Signed)
-

## 2019-10-17 NOTE — Assessment & Plan Note (Signed)
Well-controlled at home.  Continue current regimen.  Lab work as outlined below.

## 2019-10-17 NOTE — Assessment & Plan Note (Signed)
Encouraged continued diet and exercise.  Check A1c.

## 2019-11-25 ENCOUNTER — Ambulatory Visit (INDEPENDENT_AMBULATORY_CARE_PROVIDER_SITE_OTHER): Payer: Medicare Other | Admitting: Internal Medicine

## 2019-11-25 ENCOUNTER — Encounter: Payer: Self-pay | Admitting: Internal Medicine

## 2019-11-25 ENCOUNTER — Other Ambulatory Visit: Payer: Self-pay

## 2019-11-25 ENCOUNTER — Other Ambulatory Visit (HOSPITAL_COMMUNITY)
Admission: RE | Admit: 2019-11-25 | Discharge: 2019-11-25 | Disposition: A | Discharge status: Home / Self Care | Payer: Medicare Other | Source: Ambulatory Visit | Attending: Internal Medicine | Admitting: Internal Medicine

## 2019-11-25 ENCOUNTER — Ambulatory Visit: Payer: Medicare Other | Admitting: Internal Medicine

## 2019-11-25 ENCOUNTER — Encounter: Payer: Self-pay | Admitting: Family Medicine

## 2019-11-25 VITALS — BP 126/70 | HR 67 | Temp 98.4°F | Ht 64.0 in | Wt 115.8 lb

## 2019-11-25 DIAGNOSIS — N760 Acute vaginitis: Secondary | ICD-10-CM | POA: Diagnosis not present

## 2019-11-25 DIAGNOSIS — N3000 Acute cystitis without hematuria: Secondary | ICD-10-CM | POA: Diagnosis not present

## 2019-11-25 DIAGNOSIS — R102 Pelvic and perineal pain: Secondary | ICD-10-CM

## 2019-11-25 DIAGNOSIS — N200 Calculus of kidney: Secondary | ICD-10-CM | POA: Diagnosis not present

## 2019-11-25 DIAGNOSIS — I10 Essential (primary) hypertension: Secondary | ICD-10-CM

## 2019-11-25 DIAGNOSIS — R1084 Generalized abdominal pain: Secondary | ICD-10-CM

## 2019-11-25 LAB — POCT URINALYSIS DIPSTICK
Bilirubin, UA: NEGATIVE
Blood, UA: NEGATIVE
Glucose, UA: NEGATIVE
Ketones, UA: NEGATIVE
Leukocytes, UA: NEGATIVE
Nitrite, UA: NEGATIVE
Protein, UA: NEGATIVE
Spec Grav, UA: 1.015 (ref 1.010–1.025)
Urobilinogen, UA: 0.2 E.U./dL
pH, UA: 7 (ref 5.0–8.0)

## 2019-11-25 MED ORDER — LOSARTAN POTASSIUM 25 MG PO TABS: 12.5000 mg | ORAL_TABLET | Freq: Every day | ORAL | 3 refills | Status: DC

## 2019-11-25 MED ORDER — LOSARTAN POTASSIUM 25 MG PO TABS: 25.0000 mg | ORAL_TABLET | Freq: Every day | ORAL | 3 refills | Status: DC

## 2019-11-25 MED ORDER — CIPROFLOXACIN HCL 500 MG PO TABS: 500.0000 mg | ORAL_TABLET | Freq: Two times a day (BID) | ORAL | 0 refills | Status: DC

## 2019-11-25 NOTE — Telephone Encounter (Signed)
Pt believes she has a UTI and would like to drop off specimen-pt has urgency and burning. Please call her today @ [phone]

## 2019-11-25 NOTE — Patient Instructions (Addendum)
AZO for UTI x 2 days over the counter  Water 55-64 ounces   Check BP <130/<80 If not please take losartan 12.5 mg qd    Urinary Tract Infection, Adult  A urinary tract infection (UTI) is an infection of any part of the urinary tract. The urinary tract includes the kidneys, ureters, bladder, and urethra. These organs make, store, and get rid of urine in the body. Your health care provider may use other names to describe the infection. An upper UTI affects the ureters and kidneys (pyelonephritis). A lower UTI affects the bladder (cystitis) and urethra (urethritis). What are the causes? Most urinary tract infections are caused by bacteria in your genital area, around the entrance to your urinary tract (urethra). These bacteria grow and cause inflammation of your urinary tract. What increases the risk? You are more likely to develop this condition if:  You have a urinary catheter that stays in place (indwelling).  You are not able to control when you urinate or have a bowel movement (you have incontinence).  You are female and you: ? Use a spermicide or diaphragm for birth control. ? Have low estrogen levels. ? Are pregnant.  You have certain genes that increase your risk (genetics).  You are sexually active.  You take antibiotic medicines.  You have a condition that causes your flow of urine to slow down, such as: ? An enlarged prostate, if you are female. ? Blockage in your urethra (stricture). ? A kidney stone. ? A nerve condition that affects your bladder control (neurogenic bladder). ? Not getting enough to drink, or not urinating often.  You have certain medical conditions, such as: ? Diabetes. ? A weak disease-fighting system (immunesystem). ? Sickle cell disease. ? Gout. ? Spinal cord injury. What are the signs or symptoms? Symptoms of this condition include:  Needing to urinate right away (urgently).  Frequent urination or passing small amounts of urine  frequently.  Pain or burning with urination.  Blood in the urine.  Urine that smells bad or unusual.  Trouble urinating.  Cloudy urine.  Vaginal discharge, if you are female.  Pain in the abdomen or the lower back. You may also have:  Vomiting or a decreased appetite.  Confusion.  Irritability or tiredness.  A fever.  Diarrhea. The first symptom in older adults may be confusion. In some cases, they may not have any symptoms until the infection has worsened. How is this diagnosed? This condition is diagnosed based on your medical history and a physical exam. You may also have other tests, including:  Urine tests.  Blood tests.  Tests for sexually transmitted infections (STIs). If you have had more than one UTI, a cystoscopy or imaging studies may be done to determine the cause of the infections. How is this treated? Treatment for this condition includes:  Antibiotic medicine.  Over-the-counter medicines to treat discomfort.  Drinking enough water to stay hydrated. If you have frequent infections or have other conditions such as a kidney stone, you may need to see a health care provider who specializes in the urinary tract (urologist). In rare cases, urinary tract infections can cause sepsis. Sepsis is a life-threatening condition that occurs when the body responds to an infection. Sepsis is treated in the hospital with IV antibiotics, fluids, and other medicines. Follow these instructions at home:  Medicines  Take over-the-counter and prescription medicines only as told by your health care provider.  If you were prescribed an antibiotic medicine, take it as told by  your health care provider. Do not stop using the antibiotic even if you start to feel better. General instructions  Make sure you: ? Empty your bladder often and completely. Do not hold urine for long periods of time. ? Empty your bladder after sex. ? Wipe from front to back after a bowel movement  if you are female. Use each tissue one time when you wipe.  Drink enough fluid to keep your urine pale yellow.  Keep all follow-up visits as told by your health care provider. This is important. Contact a health care provider if:  Your symptoms do not get better after 1-2 days.  Your symptoms go away and then return. Get help right away if you have:  Severe pain in your back or your lower abdomen.  A fever.  Nausea or vomiting. Summary  A urinary tract infection (UTI) is an infection of any part of the urinary tract, which includes the kidneys, ureters, bladder, and urethra.  Most urinary tract infections are caused by bacteria in your genital area, around the entrance to your urinary tract (urethra).  Treatment for this condition often includes antibiotic medicines.  If you were prescribed an antibiotic medicine, take it as told by your health care provider. Do not stop using the antibiotic even if you start to feel better.  Keep all follow-up visits as told by your health care provider. This is important. This information is not intended to replace advice given to you by your health care provider. Make sure you discuss any questions you have with your health care provider. Document Revised: 02/04/2018 Document Reviewed: 08/27/2017 Elsevier Patient Education  2020 [employer].

## 2019-11-25 NOTE — Progress Notes (Signed)
Chief Complaint  Patient presents with  . Urinary Tract Infection  . Vaginitis   F/u  1. HTN on norvasc 10 mg qd not taking propranolol 80 or losartan 100 mg qd taking only norvasc 10 mg qd BP at times 1 teens/60s but today elevated she has lost from 144 to 115.8 lbs   2. Reason for visit today h/o 4 mm right kidney stone noted 02/14/19 Ab Xray 1 view she had passed 1 in the past  and f/u urology c/o lower ab pressure, burning, urgency feeling bloated (denies constipation) had sxs since 08/2019 when went to urgent care given Doxycycline but sx's recently worsening over the last 2-3 weeks  Of note pt doing plant based diet to lose weight above   Review of Systems  Constitutional: Negative for weight loss.  HENT: Negative for hearing loss.   Respiratory: Negative for shortness of breath.   Cardiovascular: Negative for chest pain.  Gastrointestinal: Positive for abdominal pain.  Genitourinary: Positive for dysuria, frequency and urgency. Negative for flank pain.  Musculoskeletal: Positive for back pain.       +low mid and low back pain prior imaging with TL spondylosis   Skin: Negative for rash.   Past Medical History:  Diagnosis Date  . Allergy   . Anxiety   . Arthritis   . Diabetes mellitus without complication ([place])   . H/O exercise stress test    normal  . H/O psoriasis   . Head injury, closed 2013  . Hiatal hernia   . History of chicken pox   . Hx of acute bronchitis   . Hx of migraines   . Hyperlipidemia   . Hypertension   . Irregular heart beat    Past Surgical History:  Procedure Laterality Date  . CARPAL TUNNEL RELEASE Bilateral    both hands, Dr. Ranae Pila  . CATARACT EXTRACTION Left   . EYE SURGERY Left    cornea transplant, Dr. Sandra Cockayne  . TRIGGER FINGER RELEASE Bilateral   . TUBAL LIGATION     Family History  Problem Relation Age of Onset  . Hypertension Maternal Aunt   . Aneurysm Maternal Aunt   . Kidney failure Maternal Aunt   . Diabetes Maternal  Grandmother   . Heart disease Maternal Grandmother   . Hypertension Maternal Grandmother   . Diabetes Paternal Grandmother   . Heart disease Paternal Grandmother   . Hypertension Paternal Grandmother   . Kidney cancer Cousin   . Bladder Cancer Neg Hx    Social History   Socioeconomic History  . Marital status: Married    Spouse name: Not on file  . Number of children: Not on file  . Years of education: Not on file  . Highest education level: Not on file  Occupational History  . Not on file  Tobacco Use  . Smoking status: Never Smoker  . Smokeless tobacco: Never Used  Substance and Sexual Activity  . Alcohol use: No  . Drug use: No  . Sexual activity: Not Currently  Other Topics Concern  . Not on file  Social History Narrative   Lives with husband in [place]. Cat in home. 1 daughter and 2 sons. 7 grandchildren.      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 Difficulty of Paying Living Expenses: Not on file  Food Insecurity:   . Worried About Charity fundraiser in the Last Year: Not on file  . Ran Out of Food in the Last Year: Not on file  Transportation Needs:   . Lack of Transportation (Medical): Not on file  . Lack of Transportation (Non-Medical): Not on file  Physical Activity:   . Days of Exercise per Week: Not on file  . Minutes of Exercise per Session: Not on file  Stress:   . Feeling of Stress : Not on file  Social Connections:   . Frequency of Communication with Friends and Family: Not on file  . Frequency of Social Gatherings with Friends and Family: Not on file  . Attends Religious Services: Not on file  . Active Member of Clubs or Organizations: Not on file  . Attends Archivist Meetings: Not on file  . Marital Status: Not on file  Intimate Partner Violence:   . Fear of Current or Ex-Partner: Not on file  . Emotionally Abused:  Not on file  . Physically Abused: Not on file  . Sexually Abused: Not on file   Current Meds  Medication Sig  . amLODipine (NORVASC) 10 MG tablet TAKE 1 TABLET BY MOUTH EVERY DAY  . ascorbic acid (PX VITAMIN C) 500 MG tablet Take 500 mg by mouth daily.  . Cholecalciferol (VITAMIN D3) 25 MCG (1000 UT) CAPS Take by mouth.  . citalopram (CELEXA) 10 MG tablet TAKE 1 TABLET (10 MG TOTAL) DAILY BY MOUTH.  . Cranberry 500 MG CAPS Take by mouth.  . psyllium (METAMUCIL) 58.6 % packet Take 1 packet by mouth daily.   Marland Kitchen triamcinolone cream (KENALOG) 0.1 % Apply 1 application topically 2 (two) times daily.   . Zinc 50 MG TABS Take by mouth.   Allergies  Allergen Reactions  . Penicillins    Recent Results (from the past 2160 hour(s))  HM DIABETES EYE EXAM     Status: None   Collection Time: 09/12/19 12:00 AM  Result Value Ref Range   HM Diabetic Eye Exam No Retinopathy No Retinopathy  POCT Urinalysis Dipstick     Status: Abnormal   Collection Time: 10/17/19  8:43 AM  Result Value Ref Range   Color, UA yellow    Clarity, UA clear    Glucose, UA Negative Negative   Bilirubin, UA negative    Ketones, UA positive    Spec Grav, UA <=1.005 (A) 1.010 - 1.025   Blood, UA negative    pH, UA 7.0 5.0 - 8.0   Protein, UA Negative Negative   Urobilinogen, UA 0.2 0.2 or 1.0 E.U./dL   Nitrite, UA negative    Leukocytes, UA Negative Negative   Appearance clear    Odor none   Lipid panel     Status: None   Collection Time: 10/17/19  9:03 AM  Result Value Ref Range   Cholesterol 152 0 - 200 mg/dL    Comment: ATP III Classification       Desirable:  < 200 mg/dL               Borderline High:  200 - 239 mg/dL          High:  > = 240 mg/dL   Triglycerides 53.0 0 - 149 mg/dL    Comment: Normal:  <150 mg/dLBorderline High:  150 - 199 mg/dL   HDL 75.40 >39.00 mg/dL   VLDL 10.6 0.0 - 40.0 mg/dL   LDL Cholesterol 66 0 - 99 mg/dL  Total CHOL/HDL Ratio 2     Comment:                Men          Women1/2  Average Risk     3.4          3.3Average Risk          5.0          4.42X Average Risk          9.6          7.13X Average Risk          15.0          11.0                       NonHDL 76.44     Comment: NOTE:  Non-HDL goal should be 30 mg/dL higher than patient's LDL goal (i.e. LDL goal of < 70 mg/dL, would have non-HDL goal of < 100 mg/dL)  Comp Met (CMET)     Status: Abnormal   Collection Time: 10/17/19  9:03 AM  Result Value Ref Range   Sodium 142 135 - 145 mEq/L   Potassium 4.6 3.5 - 5.1 mEq/L   Chloride 105 96 - 112 mEq/L   CO2 31 19 - 32 mEq/L   Glucose, Bld 115 (H) 70 - 99 mg/dL   BUN 19 6 - 23 mg/dL   Creatinine, Ser 0.63 0.40 - 1.20 mg/dL   Total Bilirubin 0.9 0.2 - 1.2 mg/dL   Alkaline Phosphatase 59 39 - 117 U/L   AST 21 0 - 37 U/L   ALT 17 0 - 35 U/L   Total Protein 6.5 6.0 - 8.3 g/dL   Albumin 4.1 3.5 - 5.2 g/dL   GFR 93.15 >60.00 mL/min   Calcium 9.6 8.4 - 10.5 mg/dL  HgB A1c     Status: None   Collection Time: 10/17/19  9:03 AM  Result Value Ref Range   Hgb A1c MFr Bld 6.0 4.6 - 6.5 %    Comment: Glycemic Control Guidelines for People with Diabetes:Non Diabetic:  <6%Goal of Therapy: <7%Additional Action Suggested:  >8%   POCT Urinalysis Dipstick     Status: Normal   Collection Time: 11/25/19  2:12 PM  Result Value Ref Range   Color, UA yellow    Clarity, UA cloudy    Glucose, UA Negative Negative   Bilirubin, UA neg    Ketones, UA neg    Spec Grav, UA 1.015 1.010 - 1.025   Blood, UA neg    pH, UA 7.0 5.0 - 8.0   Protein, UA Negative Negative   Urobilinogen, UA 0.2 0.2 or 1.0 E.U./dL   Nitrite, UA neg    Leukocytes, UA Negative Negative   Appearance     Odor     Objective  Body mass index is 19.88 kg/m. Wt Readings from Last 3 Encounters:  11/25/19 115 lb 12.8 oz (52.5 kg)  10/17/19 114 lb (51.7 kg)  02/15/19 117 lb (53.1 kg)   Temp Readings from Last 3 Encounters:  11/25/19 98.4 F (36.9 C) (Oral)  10/17/19 98.2 F (36.8 C) (Oral)  10/13/18  98.4 F (36.9 C) (Temporal)   BP Readings from Last 3 Encounters:  11/25/19 126/70  10/17/19 130/70  02/15/19 132/61   Pulse Readings from Last 3 Encounters:  11/25/19 67  10/17/19 68  02/15/19 74    Physical Exam Vitals and nursing note reviewed.  Exam conducted with a chaperone present.  Constitutional:      Appearance: Normal appearance. She is well-developed and well-groomed.  HENT:     Head: Normocephalic and atraumatic.  Eyes:     Conjunctiva/sclera: Conjunctivae normal.     Pupils: Pupils are equal, round, and reactive to light.  Cardiovascular:     Rate and Rhythm: Normal rate and regular rhythm.     Heart sounds: Normal heart sounds. No murmur heard.   Pulmonary:     Effort: Pulmonary effort is normal.     Breath sounds: Normal breath sounds.  Abdominal:     Tenderness: There is abdominal tenderness in the suprapubic area. There is no right CVA tenderness or left CVA tenderness.  Genitourinary:    Labia:        Right: No rash.        Left: No rash.      Vagina: Normal.     Cervix: Normal.     Uterus: Normal.      Adnexa: Right adnexa normal and left adnexa normal.  Skin:    General: Skin is warm and dry.  Neurological:     General: No focal deficit present.     Mental Status: She is alert and oriented to person, place, and time. Mental status is at baseline.     Gait: Gait normal.  Psychiatric:        Attention and Perception: Attention and perception normal.        Mood and Affect: Mood and affect normal.        Speech: Speech normal.        Behavior: Behavior normal. Behavior is cooperative.        Thought Content: Thought content normal.        Cognition and Memory: Cognition and memory normal.        Judgment: Judgment normal.     Assessment  Plan  Acute cystitis without hematuria - Plan: Urinalysis, Routine w reflex microscopic, Urine Culture, POCT Urinalysis Dipstick, ciprofloxacin (CIPRO) 500 MG tablet bid 3-5 days Dipstick cloudy otherwise  normal  AZO UTI otc  F/u urology PA-C [facility]  Essential hypertension - Plan: losartan (COZAAR) 12.5 MG tablet qd if BP >130/>80 On norvasc 10 mg qd  Of note pt not taking losartan 100 or lipitor 20   Acute vaginitis - Plan: Cervicovaginal ancillary only( [facility])   Ordered cologuard due 11.27.21 Of note pt not taking apremilast 30   Provider: Dr. Olivia Mackie McLean-Scocuzza-Internal Medicine

## 2019-11-25 NOTE — Progress Notes (Signed)
Patient presenting with pressure, burning, urinary frequency, low back pain, and feeling bloated. Patient does have kidney stones and has recently passed one.

## 2019-11-26 LAB — URINE CULTURE
MICRO NUMBER:: 10993060
Result:: NO GROWTH
SPECIMEN QUALITY:: ADEQUATE

## 2019-11-26 LAB — URINALYSIS, ROUTINE W REFLEX MICROSCOPIC
Bilirubin Urine: NEGATIVE
Glucose, UA: NEGATIVE
Hgb urine dipstick: NEGATIVE
Ketones, ur: NEGATIVE
Leukocytes,Ua: NEGATIVE
Nitrite: NEGATIVE
Protein, ur: NEGATIVE
Specific Gravity, Urine: 1.016 (ref 1.001–1.03)
pH: 7 (ref 5.0–8.0)

## 2019-11-28 LAB — CERVICOVAGINAL ANCILLARY ONLY
Bacterial Vaginitis (gardnerella): NEGATIVE
Candida Glabrata: NEGATIVE
Candida Vaginitis: NEGATIVE
Comment: NEGATIVE
Comment: NEGATIVE
Comment: NEGATIVE
Comment: NEGATIVE
Trichomonas: NEGATIVE

## 2019-12-05 ENCOUNTER — Telehealth: Payer: Self-pay

## 2019-12-05 NOTE — Telephone Encounter (Signed)
Yes will order CT abdomen pelvis as discussed at visit   Pt will be notified when scheduled    [place]

## 2019-12-05 NOTE — Telephone Encounter (Signed)
Pt called to inquire about CT referral on her abdomen. She states that it has been over a week. Please advise

## 2019-12-05 NOTE — Telephone Encounter (Signed)
Pt called to inquire about CT referral on her abdomen. She states that it has been over a week. The referral order is not put in.  Renada Cronin,cma

## 2019-12-05 NOTE — Addendum Note (Signed)
Addended by: Orland Mustard on: 12/05/2019 08:53 PM   Modules accepted: Orders

## 2019-12-06 NOTE — Telephone Encounter (Signed)
LVM for the patient that her CT was ordered and she will receive a call soon.  Syrus Nakama,cma

## 2019-12-15 ENCOUNTER — Telehealth: Payer: Self-pay | Admitting: Family Medicine

## 2019-12-15 NOTE — Telephone Encounter (Signed)
Patient called stated that the last cologuard she had was in 2018 and wanted to cancel the one that is order now until December so her insurance will pay for it they will not pay for now the thre years will be up in December

## 2019-12-15 NOTE — Telephone Encounter (Signed)
I called and cancelled the order for Cologuard and they are sending a confirmation fax today. The patient was notified.  Kashton Mcartor,cma

## 2019-12-19 ENCOUNTER — Ambulatory Visit
Admission: RE | Admit: 2019-12-19 | Discharge: 2019-12-19 | Disposition: A | Discharge status: Home / Self Care | Payer: Medicare Other | Source: Ambulatory Visit | Attending: Internal Medicine | Admitting: Internal Medicine

## 2019-12-19 ENCOUNTER — Other Ambulatory Visit: Payer: Self-pay

## 2019-12-19 DIAGNOSIS — R102 Pelvic and perineal pain: Secondary | ICD-10-CM

## 2019-12-19 DIAGNOSIS — K8020 Calculus of gallbladder without cholecystitis without obstruction: Secondary | ICD-10-CM | POA: Diagnosis not present

## 2019-12-19 DIAGNOSIS — N200 Calculus of kidney: Secondary | ICD-10-CM | POA: Diagnosis not present

## 2019-12-19 DIAGNOSIS — R1084 Generalized abdominal pain: Secondary | ICD-10-CM | POA: Diagnosis not present

## 2019-12-19 DIAGNOSIS — R350 Frequency of micturition: Secondary | ICD-10-CM | POA: Diagnosis not present

## 2019-12-19 DIAGNOSIS — I700 Atherosclerosis of aorta: Secondary | ICD-10-CM | POA: Diagnosis not present

## 2019-12-20 ENCOUNTER — Encounter: Payer: Self-pay | Admitting: Family Medicine

## 2019-12-20 DIAGNOSIS — E785 Hyperlipidemia, unspecified: Secondary | ICD-10-CM

## 2019-12-21 ENCOUNTER — Ambulatory Visit
Admission: EM | Admit: 2019-12-21 | Discharge: 2019-12-21 | Disposition: A | Discharge status: Home / Self Care | Payer: Medicare Other | Attending: Emergency Medicine | Admitting: Emergency Medicine

## 2019-12-21 ENCOUNTER — Other Ambulatory Visit: Payer: Self-pay

## 2019-12-21 DIAGNOSIS — M545 Low back pain, unspecified: Secondary | ICD-10-CM | POA: Diagnosis not present

## 2019-12-21 DIAGNOSIS — N200 Calculus of kidney: Secondary | ICD-10-CM | POA: Diagnosis not present

## 2019-12-21 LAB — POCT URINALYSIS DIP (MANUAL ENTRY)
Bilirubin, UA: NEGATIVE
Glucose, UA: NEGATIVE mg/dL
Ketones, POC UA: NEGATIVE mg/dL
Leukocytes, UA: NEGATIVE
Nitrite, UA: NEGATIVE
Protein Ur, POC: NEGATIVE mg/dL
Spec Grav, UA: 1.02 (ref 1.010–1.025)
Urobilinogen, UA: 0.2 E.U./dL
pH, UA: 5.5 (ref 5.0–8.0)

## 2019-12-21 MED ORDER — ATORVASTATIN CALCIUM 20 MG PO TABS
20.0000 mg | ORAL_TABLET | Freq: Every day | ORAL | 3 refills | Status: DC
Start: 2019-12-21 — End: 2020-12-04

## 2019-12-21 NOTE — Telephone Encounter (Signed)
Pt is following up on message she sent Dr. Caryl Bis. She is concerned and would like a call back as soon as possible.

## 2019-12-21 NOTE — ED Provider Notes (Signed)
[facility]    CSN: [id number] Arrival date & time: 12/21/19  1102      History   Chief Complaint Chief Complaint  Patient presents with  . Back Pain  . Urinary Urgency    HPI Molly Gregory is a 69 y.o. female.   Patient presents with midline lower back pain, lower abdominal pain, urinary frequency, bladder pressure x several months.  She rates her low back pain 8/10 and believes it is due to her kidney stone.  She also reports hematuria intermittently times several months.  She denies numbness, weakness, fever, chills, cough, shortness of breath, vomiting, diarrhea, constipation, dysuria, rash, lesions, or other symptoms.  She had an abdominal CT on 12/19/2019 which showed stable 4 mm nonobstructing right side kidney stone.  Her medical history includes kidney stone, cholelithiasis, hypertension, aortic atherosclerosis, diabetes, left sciatic nerve pain, arthritis, peripheral neuropathy, chronic headaches, diverticulosis, squamous cell carcinoma, anxiety.  The history is provided by the patient.    Past Medical History:  Diagnosis Date  . Allergy   . Anxiety   . Arthritis   . Diabetes mellitus without complication ([place])   . Diverticulosis   . H/O exercise stress test    normal  . H/O psoriasis   . Head injury, closed 2013  . Hiatal hernia   . History of chicken pox   . Hx of acute bronchitis   . Hx of migraines   . Hyperlipidemia   . Hypertension   . Irregular heart beat     Patient Active Problem List   Diagnosis Date Noted  . PMB (postmenopausal bleeding) 03/15/2018  . Hyperlipidemia 04/10/2017  . Minor opacity of both corneas 03/17/2017  . Anxiety 03/14/2017  . Allergic rhinitis 03/14/2017  . SCC (squamous cell carcinoma), face 11/27/2016  . Prediabetes 10/08/2016  . Status post YAG capsulotomy of both eyes 09/07/2016  . Pseudophakia of both eyes 04/15/2016  . Status post corneal transplant 03/10/2016  . Corneal dystrophy 03/07/2016  .  Hematuria 12/04/2015  . Chronic headache 12/04/2015  . Hereditary and idiopathic peripheral neuropathy 01/15/2015  . Squamous cell carcinoma in situ of skin of right lower leg 07/03/2014  . Bereavement 12/14/2013  . Family history of aortic aneurysm 12/14/2013  . Arthralgia 12/14/2013  . Left sciatic nerve pain 06/14/2013  . Essential hypertension, benign 12/16/2012  . Psoriasis 12/16/2012    Past Surgical History:  Procedure Laterality Date  . CARPAL TUNNEL RELEASE Bilateral    both hands, Dr. Ranae Pila  . CATARACT EXTRACTION Left   . EYE SURGERY Left    cornea transplant, Dr. Sandra Cockayne  . TRIGGER FINGER RELEASE Bilateral   . TUBAL LIGATION      OB History   No obstetric history on file.      Home Medications    Prior to Admission medications   Medication Sig Start Date End Date Taking? Authorizing Provider  albuterol (PROVENTIL HFA;VENTOLIN HFA) 108 (90 Base) MCG/ACT inhaler Inhale 2 puffs into the lungs every 6 (six) hours as needed for wheezing or shortness of breath. Patient not taking: Reported on 10/17/2019 02/13/17   Leone Haven, MD  amLODipine (NORVASC) 10 MG tablet TAKE 1 TABLET BY MOUTH EVERY DAY 11/10/18   Leone Haven, MD  ascorbic acid (PX VITAMIN C) 500 MG tablet Take 500 mg by mouth daily.    [provider]  atorvastatin (LIPITOR) 20 MG tablet TAKE 1 TABLET BY MOUTH EVERY DAY Patient not taking: Reported on 11/25/2019 03/16/19  Leone Haven, MD  Cholecalciferol (VITAMIN D3) 25 MCG (1000 UT) CAPS Take by mouth.    [provider]  ciprofloxacin (CIPRO) 500 MG tablet Take 1 tablet (500 mg total) by mouth 2 (two) times daily. With food x 3-5 days 11/25/19   McLean-Scocuzza, Nino Glow, MD  citalopram (CELEXA) 10 MG tablet TAKE 1 TABLET (10 MG TOTAL) DAILY BY MOUTH. 08/12/19 11/25/19  Leone Haven, MD  Cranberry 500 MG CAPS Take by mouth.    [provider]  losartan (COZAAR) 25 MG tablet Take 0.5 tablets (12.5 mg total)  by mouth daily. 11/25/19   McLean-Scocuzza, Nino Glow, MD  Multiple Vitamins-Minerals (WOMENS MULTIVITAMIN PLUS PO) Take by mouth. Patient not taking: Reported on 11/25/2019    [provider]  psyllium (METAMUCIL) 58.6 % packet Take 1 packet by mouth daily.     [provider]  triamcinolone cream (KENALOG) 0.1 % Apply 1 application topically 2 (two) times daily.     [provider]  Zinc 50 MG TABS Take by mouth.    [provider]    Family History Family History  Problem Relation Age of Onset  . Hypertension Maternal Aunt   . Aneurysm Maternal Aunt   . Kidney failure Maternal Aunt   . Diabetes Maternal Grandmother   . Heart disease Maternal Grandmother   . Hypertension Maternal Grandmother   . Diabetes Paternal Grandmother   . Heart disease Paternal Grandmother   . Hypertension Paternal Grandmother   . Kidney cancer Cousin   . Bladder Cancer Neg Hx     Social History Social History   Tobacco Use  . Smoking status: Never Smoker  . Smokeless tobacco: Never Used  Substance Use Topics  . Alcohol use: No  . Drug use: No     Allergies   Penicillins   Review of Systems Review of Systems  Constitutional: Negative for chills and fever.  HENT: Negative for ear pain and sore throat.   Eyes: Negative for pain and visual disturbance.  Respiratory: Negative for cough and shortness of breath.   Cardiovascular: Negative for chest pain and palpitations.  Gastrointestinal: Positive for abdominal pain. Negative for diarrhea, nausea and vomiting.  Genitourinary: Positive for frequency and hematuria. Negative for dysuria and vaginal discharge.  Musculoskeletal: Positive for back pain. Negative for arthralgias.  Skin: Negative for color change and rash.  Neurological: Negative for seizures, syncope, weakness and numbness.  All other systems reviewed and are negative.    Physical Exam Triage Vital Signs ED Triage Vitals  Enc Vitals Group     BP       Pulse      Resp      Temp      Temp src      SpO2      Weight      Height      Head Circumference      Peak Flow      Pain Score      Pain Loc      Pain Edu?      Excl. in [place]?    No data found.  Updated Vital Signs BP 134/64   Pulse 66   Temp 98.7 F (37.1 C)   Resp 18   SpO2 96%   Visual Acuity Right Eye Distance:   Left Eye Distance:   Bilateral Distance:    Right Eye Near:   Left Eye Near:    Bilateral Near:     Physical  Exam Vitals and nursing note reviewed.  Constitutional:      General: She is not in acute distress.    Appearance: She is well-developed. She is not ill-appearing.  HENT:     Head: Normocephalic and atraumatic.     Mouth/Throat:     Mouth: Mucous membranes are moist.  Eyes:     Conjunctiva/sclera: Conjunctivae normal.  Cardiovascular:     Rate and Rhythm: Normal rate and regular rhythm.     Heart sounds: No murmur heard.   Pulmonary:     Effort: Pulmonary effort is normal. No respiratory distress.     Breath sounds: Normal breath sounds.  Abdominal:     General: Abdomen is flat. Bowel sounds are normal. There is no distension.     Palpations: Abdomen is soft.     Tenderness: There is generalized abdominal tenderness. There is no right CVA tenderness, left CVA tenderness, guarding or rebound.     Comments: Mild generalized tenderness of abdomen to palpation.  No rebound or guarding.  Musculoskeletal:        General: No swelling, tenderness, deformity or signs of injury. Normal range of motion.     Cervical back: Neck supple.     Right lower leg: No edema.     Left lower leg: No edema.     Comments: Negative straight leg raise.  Skin:    General: Skin is warm and dry.     Capillary Refill: Capillary refill takes less than 2 seconds.     Findings: No bruising, erythema, lesion or rash.  Neurological:     General: No focal deficit present.     Mental Status: She is alert and oriented to person, place, and time.     Sensory: No  sensory deficit.     Motor: No weakness.     Coordination: Coordination normal.     Gait: Gait normal.  Psychiatric:        Mood and Affect: Mood normal.        Behavior: Behavior normal.      UC Treatments / Results  Labs (all labs ordered are listed, but only abnormal results are displayed) Labs Reviewed  POCT URINALYSIS DIP (MANUAL ENTRY) - Abnormal; Notable for the following components:      Result Value   Blood, UA small (*)    All other components within normal limits    EKG   Radiology CT Abdomen Pelvis Wo Contrast  Result Date: 12/20/2019 CLINICAL DATA:  Acute abdominal pain, urinary frequency EXAM: CT ABDOMEN AND PELVIS WITHOUT CONTRAST TECHNIQUE: Multidetector CT imaging of the abdomen and pelvis was performed following the standard protocol without IV contrast. COMPARISON:  01/22/2016 FINDINGS: Lower chest: The visualized lung bases are clear bilaterally. The visualized heart and pericardium are unremarkable. Hepatobiliary: Cholelithiasis without pericholecystic inflammatory change noted. Liver unremarkable. No intra or extrahepatic biliary ductal dilation. Pancreas: Unremarkable Spleen: Unremarkable Adrenals/Urinary Tract: The adrenal glands are unremarkable. Stable 4 mm nonobstructing calculus within the lower pole of the right kidney. The kidneys are otherwise unremarkable. Bladder is nearly completely decompressed and is otherwise normal. Stomach/Bowel: Stomach is within normal limits. Appendix appears normal. No evidence of bowel wall thickening, distention, or inflammatory changes. No free intraperitoneal gas or fluid. Vascular/Lymphatic: Mild aortoiliac atherosclerotic calcification noted. No aortic aneurysm. No pathologic adenopathy within the abdomen and pelvis. Reproductive: Stable 17 mm enhancing lesion within the left uterine fundus most in keeping with a uterine fibroid. The pelvic organs are otherwise unremarkable. Other: Rectum unremarkable.  Musculoskeletal: No  lytic or blastic bone lesions are seen. IMPRESSION: No radiographic explanation for the patient's reported symptoms. Stable 4 mm nonobstructing calculus within the lower pole of the right kidney. Cholelithiasis Aortic Atherosclerosis (ICD10-I70.0). Electronically Signed   By: Fidela Salisbury MD   On: 12/20/2019 08:48    Procedures Procedures (including critical care time)  Medications Ordered in UC Medications - No data to display  Initial Impression / Assessment and Plan / UC Course  I have reviewed the triage vital signs and the nursing notes.  Pertinent labs & imaging results that were available during my care of the patient were reviewed by me and considered in my medical decision making (see chart for details).   Acute low back pain without sciatica;  Kidney stone which was stable and nonobstructing on CT scan 2 days ago.  Precautions given for going to the ED if she has acute abdominal pain or other concerning symptoms.  Instructed her to take Tylenol or ibuprofen as needed; increase her water intake; schedule a follow-up appointment with her PCP.  Education provided on acute lower back pain and kidney stones.  Patient agrees to plan of care.   Final Clinical Impressions(s) / UC Diagnoses   Final diagnoses:  Acute midline low back pain without sciatica  Kidney stone     Discharge Instructions     Take Tylenol or ibuprofen as needed for discomfort.  Increase your water intake.  See the attached educational information on acute back pain and kidney stones.    Follow up with your primary care provider if your symptoms are not improving.          ED Prescriptions    None     I have reviewed the PDMP during this encounter.   Sharion Balloon, NP 12/21/19 1149

## 2019-12-21 NOTE — ED Triage Notes (Signed)
Pt presents with concerns for back pain, lower abdominal pain, urinary frequency, and having a pressure sensation in her vaginal area. Reports she had a CT on Monday that showed a stable kidney stone, gallstone and suspected fibroid. Pt is concerned for the pain and urinary frequency.

## 2019-12-21 NOTE — Discharge Instructions (Signed)
Take Tylenol or ibuprofen as needed for discomfort.  Increase your water intake.  See the attached educational information on acute back pain and kidney stones.    Follow up with your primary care provider if your symptoms are not improving.

## 2019-12-23 NOTE — Addendum Note (Signed)
Addended by: Caryl Bis Sadye Kiernan G on: 12/23/2019 12:57 PM   Modules accepted: Orders

## 2019-12-26 NOTE — Progress Notes (Signed)
03/16/2018  11:59 AM   Molly Gregory October 09, 1950 [id number]  Referring provider: Leone Haven, MD [address] [place],  [place] [address]  Chief Complaint  Patient presents with  . Hematuria    HPI: Molly Gregory is a 69 y.o. female with high risk hematuria and nephrolithiasis who presents today for follow up.    High risk hematuria Non-smoker.  CT renal stone study 12/2019 the adrenal glands are unremarkable. Stable 4 mm nonobstructing calculus within the lower pole of the right kidney. The kidneys are otherwise  unremarkable. Bladder is nearly completely decompressed and is otherwise normal.    Contrast CT 07/2016 adrenal glands are within normal limits.  4 mm nonobstructing right lower pole renal calculus (series 5/image 115). Kidneys are otherwise within normal limits. No hydronephrosis.  Bladder is within normal limits.  CT w/o and w on 01/22/2016 which noted nonobstructing right renal stones. No left renal stones. No ureteral or bladder stones. No hydronephrosis.  No renal cortical masses. No urothelial lesions, with limitations as described.  Additional findings include aortic atherosclerosis, cholelithiasis, small sliding hiatal hernia, mild sigmoid diverticulosis and small left uterine fibroid.  Cystoscopy performed on 02/15/2016 was negative.  She does not endorse any gross hematuria since her last visit with [place].  Her UA today is positive for 11-30 RBCs per high-power field  Right renal stones CT Renal stone study 12/2019 with stable 4 mm non obstructing calculus in the lower pole of the right kidney.   The stone has not changed in size or position since 2017.  She is also been having intermittent right-sided back pain which she unsure whether it is from her right kidney stone or arthritis.  She has been having lower pelvic pain that feels like something is rubbing together when she walks, frequency, urgency, dampness in the underwear after walking for the  last 2 months.   Patient denies any modifying or aggravating factors.  Patient denies any gross hematuria and dysuria.  Patient denies any fevers, chills, nausea or vomiting.   She was evaluated by Dr. Terese Door for these symptoms as well with non contrast CT, UA, urine culture, and wet prep for trichomonas, BV and candidiasis all which were negative.  PMH: Past Medical History:  Diagnosis Date  . Allergy   . Anxiety   . Arthritis   . Diabetes mellitus without complication ([place])   . Diverticulosis   . H/O exercise stress test    normal  . H/O psoriasis   . Head injury, closed 2013  . Hiatal hernia   . History of chicken pox   . Hx of acute bronchitis   . Hx of migraines   . Hyperlipidemia   . Hypertension   . Irregular heart beat     Surgical History: Past Surgical History:  Procedure Laterality Date  . CARPAL TUNNEL RELEASE Bilateral    both hands, Dr. Ranae Pila  . CATARACT EXTRACTION Left   . EYE SURGERY Left    cornea transplant, Dr. Sandra Cockayne  . TRIGGER FINGER RELEASE Bilateral   . TUBAL LIGATION      Home Medications:  Allergies as of 12/27/2019      Reactions   Penicillins       Medication List       Accurate as of December 27, 2019 11:59 AM. If you have any questions, ask your nurse or doctor.        STOP taking these medications   ciprofloxacin 500 MG tablet  Commonly known as: Cipro Stopped by: Zara Council, PA-C     TAKE these medications   albuterol 108 (90 Base) MCG/ACT inhaler Commonly known as: VENTOLIN HFA Inhale 2 puffs into the lungs every 6 (six) hours as needed for wheezing or shortness of breath.   amLODipine 10 MG tablet Commonly known as: NORVASC TAKE 1 TABLET BY MOUTH EVERY DAY   atorvastatin 20 MG tablet Commonly known as: LIPITOR Take 1 tablet (20 mg total) by mouth daily.   citalopram 10 MG tablet Commonly known as: CELEXA TAKE 1 TABLET (10 MG TOTAL) DAILY BY MOUTH.   Cranberry 500 MG Caps Take by mouth.     losartan 25 MG tablet Commonly known as: COZAAR Take 0.5 tablets (12.5 mg total) by mouth daily.   Otezla 30 MG Tabs Generic drug: Apremilast Take 1 tablet by mouth 2 (two) times daily.   psyllium 58.6 % packet Commonly known as: METAMUCIL Take 1 packet by mouth daily.   PX Vitamin C 500 MG tablet Generic drug: ascorbic acid Take 500 mg by mouth daily.   triamcinolone cream 0.1 % Commonly known as: KENALOG Apply 1 application topically 2 (two) times daily.   Vitamin D3 25 MCG (1000 UT) Caps Take by mouth.   WOMENS MULTIVITAMIN PLUS PO Take by mouth.   Zinc 50 MG Tabs Take by mouth.       Allergies:  Allergies  Allergen Reactions  . Penicillins     Family History: Family History  Problem Relation Age of Onset  . Hypertension Maternal Aunt   . Aneurysm Maternal Aunt   . Kidney failure Maternal Aunt   . Diabetes Maternal Grandmother   . Heart disease Maternal Grandmother   . Hypertension Maternal Grandmother   . Diabetes Paternal Grandmother   . Heart disease Paternal Grandmother   . Hypertension Paternal Grandmother   . Kidney cancer Cousin   . Bladder Cancer Neg Hx     Social History:  reports that she has never smoked. She has never used smokeless tobacco. She reports that she does not drink alcohol and does not use drugs.  ROS: For pertinent review of systems please refer to history of present illness  Physical Exam: BP (!) 96/57   Pulse 83   Ht 5\' 4"  (1.626 m)   Wt 114 lb (51.7 kg)   BMI 19.57 kg/m   Constitutional:  Well nourished. Alert and oriented, No acute distress. HEENT: [place] AT, mask in place.  Trachea midline Cardiovascular: No clubbing, cyanosis, or edema. Respiratory: Normal respiratory effort, no increased work of breathing. GU: No CVA tenderness.  No bladder fullness or masses.  Atrophic external genitalia, normal pubic hair distribution, no lesions.  Normal urethral meatus, no lesions, no prolapse, no discharge.   No urethral  masses, tenderness and/or tenderness. No bladder fullness, tenderness or masses. Pale vagina mucosa, fair estrogen effect, no discharge, no lesions, fair pelvic support, no cystocele and no rectocele noted.  No cervical motion tenderness.  Uterus is freely mobile and non-fixed.  No adnexal/parametria masses or tenderness noted.  Anus and perineum are without rashes or lesions.    Skin: No rashes, bruises or suspicious lesions. Lymph: No  inguinal adenopathy. Neurologic: Grossly intact, no focal deficits, moving all 4 extremities. Psychiatric: Normal mood and affect.   Urinalysis Component     Latest Ref Rng & Units 12/27/2019          Specific Gravity, UA     1.005 - 1.030 1.025  pH, UA  5.0 - 7.5 5.0  Color, UA     Yellow Yellow  Appearance Ur     Clear Cloudy (A)  Leukocytes,UA     Negative Negative  Protein,UA     Negative/Trace 1+ (A)  Glucose, UA     Negative Negative  Ketones, UA     Negative Trace (A)  RBC, UA     Negative 3+ (A)  Bilirubin, UA     Negative Negative  Urobilinogen, Ur     0.2 - 1.0 mg/dL 0.2  Nitrite, UA     Negative Negative  Microscopic Examination      See below:   Component     Latest Ref Rng & Units 12/27/2019  WBC, UA     0 - 5 /hpf 0-5  RBC     0 - 2 /hpf 11-30 (A)  Epithelial Cells (non renal)     0 - 10 /hpf 0-10  Casts     None seen /lpf Present (A)  Cast Type     N/A Hyaline casts  Crystals     N/A Present (A)  Crystal Type     N/A Calcium Oxalate  Mucus, UA     Not Estab. Present (A)  Bacteria, UA     None seen/Few None seen   I have reviewed the labs.  Pertinent Imaging Narrative & Impression  CLINICAL DATA:  Acute abdominal pain, urinary frequency  EXAM: CT ABDOMEN AND PELVIS WITHOUT CONTRAST  TECHNIQUE: Multidetector CT imaging of the abdomen and pelvis was performed following the standard protocol without IV contrast.  COMPARISON:  01/22/2016  FINDINGS: Lower chest: The visualized lung bases are  clear bilaterally. The visualized heart and pericardium are unremarkable.  Hepatobiliary: Cholelithiasis without pericholecystic inflammatory change noted. Liver unremarkable. No intra or extrahepatic biliary ductal dilation.  Pancreas: Unremarkable  Spleen: Unremarkable  Adrenals/Urinary Tract: The adrenal glands are unremarkable. Stable 4 mm nonobstructing calculus within the lower pole of the right kidney. The kidneys are otherwise unremarkable. Bladder is nearly completely decompressed and is otherwise normal.  Stomach/Bowel: Stomach is within normal limits. Appendix appears normal. No evidence of bowel wall thickening, distention, or inflammatory changes. No free intraperitoneal gas or fluid.  Vascular/Lymphatic: Mild aortoiliac atherosclerotic calcification noted. No aortic aneurysm. No pathologic adenopathy within the abdomen and pelvis.  Reproductive: Stable 17 mm enhancing lesion within the left uterine fundus most in keeping with a uterine fibroid. The pelvic organs are otherwise unremarkable.  Other: Rectum unremarkable.  Musculoskeletal: No lytic or blastic bone lesions are seen.  IMPRESSION: No radiographic explanation for the patient's reported symptoms.  Stable 4 mm nonobstructing calculus within the lower pole of the right kidney.  Cholelithiasis  Aortic Atherosclerosis (ICD10-I70.0).   Electronically Signed   By: Fidela Salisbury MD   On: 12/20/2019 08:48    I have independently reviewed the films.  See HPI.   Assessment & Plan:    1. High risk hematuria I explained to the patient that the microscopic urine seen on today's UA may be the result of the right renal calculus, but it also may be a sign of either renal or bladder cancer.  I do recommend that she undergo cystoscopy at this time as most of her symptoms are in the pelvic area.  I did explain that the noncontrast CT that she had recently is not the indicated study for  microscopic hematuria and that pending findings on cystoscopy we may need to repeat her CT scan with contrast to complete  her work-up.  She is agreeable to this plan and will return for cystoscopy.  2. Right renal stone She is interested in some sort of definitive treatment for her stone as she continues to have intermittent back pain.  She does realize that her back pain may be due to arthritis versus the stone, but she still would like it treated.  I advised her to speak with the physician after she undergo cystoscopy regarding definitive treatment for the stone as her follow-up will be pending cystoscopy results.  3. Frequency/urgency If no etiology is discovered for her frequency and urgency during cystoscopic exam, she will follow-up to discuss other treatment modalities for her lower urinary tract symptoms  Return for Cystoscopy for micro heme.  These notes generated with voice recognition software. I apologize for typographical errors.  Zara Council, PA-C  [facility] [address] [place],  [address] [phone]

## 2019-12-27 ENCOUNTER — Other Ambulatory Visit: Payer: Self-pay

## 2019-12-27 ENCOUNTER — Telehealth: Payer: Self-pay

## 2019-12-27 ENCOUNTER — Ambulatory Visit (INDEPENDENT_AMBULATORY_CARE_PROVIDER_SITE_OTHER): Payer: Medicare Other | Admitting: Urology

## 2019-12-27 ENCOUNTER — Encounter: Payer: Self-pay | Admitting: Urology

## 2019-12-27 VITALS — BP 96/57 | HR 83 | Ht 64.0 in | Wt 114.0 lb

## 2019-12-27 DIAGNOSIS — R319 Hematuria, unspecified: Secondary | ICD-10-CM | POA: Diagnosis not present

## 2019-12-27 DIAGNOSIS — N200 Calculus of kidney: Secondary | ICD-10-CM

## 2019-12-27 LAB — MICROSCOPIC EXAMINATION: Bacteria, UA: NONE SEEN

## 2019-12-27 LAB — URINALYSIS, COMPLETE
Bilirubin, UA: NEGATIVE
Glucose, UA: NEGATIVE
Leukocytes,UA: NEGATIVE
Nitrite, UA: NEGATIVE
Specific Gravity, UA: 1.025 (ref 1.005–1.030)
Urobilinogen, Ur: 0.2 mg/dL (ref 0.2–1.0)
pH, UA: 5 (ref 5.0–7.5)

## 2019-12-27 NOTE — Telephone Encounter (Signed)
Called the patien and spoke with there  and scheduled a lab appointment per Dr. Caryl Bis in 6 weeks.  Betti Goodenow,cma

## 2019-12-28 ENCOUNTER — Other Ambulatory Visit: Payer: Self-pay | Admitting: Family Medicine

## 2019-12-30 LAB — CULTURE, URINE COMPREHENSIVE

## 2020-01-04 NOTE — Progress Notes (Signed)
   01/05/2020  CC:  Chief Complaint  Patient presents with  . Cysto    HRC:BULAGTXM Molly Gregory is a 69 y.o. female who returns for a cystoscopy.    Please refer to Molly Gregory's note from 12/27/2019.    Blood pressure (!) 148/65, pulse 69, height 5\' 4"  (1.626 m), weight 115 lb (52.2 kg). NED. A&Ox3.   No respiratory distress   Abd soft, NT, ND Normal external genitalia with patent urethral meatus  Cystoscopy Procedure Note  Patient identification was confirmed, informed consent was obtained, and patient was prepped using Betadine solution.  Lidocaine jelly was administered per urethral meatus.    Procedure: - Flexible cystoscope introduced, without any difficulty.   - Thorough search of the bladder revealed:    normal urethral meatus    normal urothelium    no stones    no ulcers     no tumors    no urethral polyps    no trabeculation  - Ureteral orifices were normal in position and appearance.  Post-Procedure: - Patient tolerated the procedure well  Assessment/ Plan:  1. High risk hematuria UA today shows no RBCs' No significant abnormalities found on cystoscopy.  CT urogram for persistent hematuria  2. Right renal stone We discussed various treatment options including ESWL vs. ureteroscopy, laser lithotripsy, and stent. We discussed the risks and benefits of both including bleeding, infection, damage to surrounding structures, efficacy with need for possible further intervention, and need for temporary ureteral stent.  Patient is most interested in surveillance.   Follow-up office visit/KUB 6 months with Molly Beach  3. Frequency/urgency  Stable.  She will follow-up with Molly Beach if treatment desired   I, Molly Gregory, am acting as a scribe for Molly Gregory,  I have reviewed the above documentation for accuracy and completeness, and I agree with the above.   Molly Sons, MD

## 2020-01-05 ENCOUNTER — Encounter: Payer: Self-pay | Admitting: Urology

## 2020-01-05 ENCOUNTER — Other Ambulatory Visit: Payer: Self-pay

## 2020-01-05 ENCOUNTER — Ambulatory Visit (INDEPENDENT_AMBULATORY_CARE_PROVIDER_SITE_OTHER): Payer: Medicare Other | Admitting: Urology

## 2020-01-05 VITALS — BP 148/65 | HR 69 | Ht 64.0 in | Wt 115.0 lb

## 2020-01-05 DIAGNOSIS — R319 Hematuria, unspecified: Secondary | ICD-10-CM | POA: Diagnosis not present

## 2020-01-05 DIAGNOSIS — N200 Calculus of kidney: Secondary | ICD-10-CM

## 2020-01-06 DIAGNOSIS — D485 Neoplasm of uncertain behavior of skin: Secondary | ICD-10-CM | POA: Diagnosis not present

## 2020-01-06 DIAGNOSIS — C44722 Squamous cell carcinoma of skin of right lower limb, including hip: Secondary | ICD-10-CM | POA: Diagnosis not present

## 2020-01-06 LAB — URINALYSIS, COMPLETE
Bilirubin, UA: NEGATIVE
Glucose, UA: NEGATIVE
Ketones, UA: NEGATIVE
Leukocytes,UA: NEGATIVE
Nitrite, UA: NEGATIVE
Protein,UA: NEGATIVE
Specific Gravity, UA: 1.01 (ref 1.005–1.030)
Urobilinogen, Ur: 0.2 mg/dL (ref 0.2–1.0)
pH, UA: 7 (ref 5.0–7.5)

## 2020-01-06 LAB — MICROSCOPIC EXAMINATION: Bacteria, UA: NONE SEEN

## 2020-01-13 ENCOUNTER — Other Ambulatory Visit: Payer: Self-pay | Admitting: Family Medicine

## 2020-01-17 DIAGNOSIS — C44722 Squamous cell carcinoma of skin of right lower limb, including hip: Secondary | ICD-10-CM | POA: Diagnosis not present

## 2020-01-31 ENCOUNTER — Other Ambulatory Visit: Payer: Self-pay | Admitting: Family Medicine

## 2020-02-05 ENCOUNTER — Encounter: Payer: Self-pay | Admitting: Family Medicine

## 2020-02-05 DIAGNOSIS — Z1211 Encounter for screening for malignant neoplasm of colon: Secondary | ICD-10-CM

## 2020-02-07 ENCOUNTER — Other Ambulatory Visit: Payer: Self-pay

## 2020-02-07 ENCOUNTER — Other Ambulatory Visit (INDEPENDENT_AMBULATORY_CARE_PROVIDER_SITE_OTHER): Payer: Medicare Other

## 2020-02-07 DIAGNOSIS — E785 Hyperlipidemia, unspecified: Secondary | ICD-10-CM | POA: Diagnosis not present

## 2020-02-07 LAB — HEPATIC FUNCTION PANEL
ALT: 23 U/L (ref 0–35)
AST: 25 U/L (ref 0–37)
Albumin: 4.1 g/dL (ref 3.5–5.2)
Alkaline Phosphatase: 66 U/L (ref 39–117)
Bilirubin, Direct: 0.2 mg/dL (ref 0.0–0.3)
Total Bilirubin: 1 mg/dL (ref 0.2–1.2)
Total Protein: 6.6 g/dL (ref 6.0–8.3)

## 2020-02-07 LAB — LDL CHOLESTEROL, DIRECT: Direct LDL: 40 mg/dL

## 2020-02-09 DIAGNOSIS — Z01419 Encounter for gynecological examination (general) (routine) without abnormal findings: Secondary | ICD-10-CM | POA: Diagnosis not present

## 2020-02-09 DIAGNOSIS — N905 Atrophy of vulva: Secondary | ICD-10-CM | POA: Diagnosis not present

## 2020-02-09 DIAGNOSIS — R32 Unspecified urinary incontinence: Secondary | ICD-10-CM | POA: Diagnosis not present

## 2020-02-09 DIAGNOSIS — D259 Leiomyoma of uterus, unspecified: Secondary | ICD-10-CM | POA: Diagnosis not present

## 2020-02-15 ENCOUNTER — Ambulatory Visit: Payer: Medicare Other | Admitting: Physician Assistant

## 2020-03-07 DIAGNOSIS — Z1211 Encounter for screening for malignant neoplasm of colon: Secondary | ICD-10-CM | POA: Diagnosis not present

## 2020-03-07 LAB — COLOGUARD: Cologuard: POSITIVE — AB

## 2020-03-13 LAB — HM DIABETES EYE EXAM

## 2020-03-16 LAB — COLOGUARD: Cologuard: NEGATIVE

## 2020-03-20 ENCOUNTER — Encounter: Payer: Self-pay | Admitting: Family Medicine

## 2020-03-20 ENCOUNTER — Telehealth: Payer: Self-pay

## 2020-03-20 DIAGNOSIS — R195 Other fecal abnormalities: Secondary | ICD-10-CM

## 2020-03-20 NOTE — Telephone Encounter (Signed)
Please call her and let her know that the result was positive.  This could indicate a polyp or a cancer or could be a false positive.  Either way it indicates that she needs a colonoscopy completed.  I can place the referral once you speak with her.  Thanks.

## 2020-03-21 NOTE — Telephone Encounter (Signed)
Referral placed.

## 2020-03-21 NOTE — Telephone Encounter (Signed)
Referral already placed.

## 2020-03-22 ENCOUNTER — Other Ambulatory Visit: Payer: Self-pay

## 2020-03-22 ENCOUNTER — Telehealth (INDEPENDENT_AMBULATORY_CARE_PROVIDER_SITE_OTHER): Payer: Self-pay | Admitting: Gastroenterology

## 2020-03-22 DIAGNOSIS — R195 Other fecal abnormalities: Secondary | ICD-10-CM

## 2020-03-22 NOTE — Progress Notes (Signed)
Gastroenterology Pre-Procedure Review  Request Date: Tuesday 04/10/20 Requesting Physician: Dr. Allen Norris  PATIENT REVIEW QUESTIONS: The patient responded to the following health history questions as indicated:    1. Are you having any GI issues? yes (positive cologuard, pt states she did notice a little blood upon wiping after she did the test.)  2. Do you have a personal history of Polyps? no polyps, history of diverticulosis 3. Do you have a family history of Colon Cancer or Polyps? no 4. Diabetes Mellitus? no 5. Joint replacements in the past 12 months?no 6. Major health problems in the past 3 months?no 7. Any artificial heart valves, MVP, or defibrillator?no    MEDICATIONS & ALLERGIES:    Patient reports the following regarding taking any anticoagulation/antiplatelet therapy:   Plavix, Coumadin, Eliquis, Xarelto, Lovenox, Pradaxa, Brilinta, or Effient? no Aspirin? no  Patient confirms/reports the following medications:  Current Outpatient Medications  Medication Sig Dispense Refill  . albuterol (PROVENTIL HFA;VENTOLIN HFA) 108 (90 Base) MCG/ACT inhaler Inhale 2 puffs into the lungs every 6 (six) hours as needed for wheezing or shortness of breath. (Patient not taking: No sig reported) 1 Inhaler 0  . amLODipine (NORVASC) 10 MG tablet TAKE 1 TABLET BY MOUTH EVERY DAY 90 tablet 2  . ascorbic acid (VITAMIN C) 500 MG tablet Take 500 mg by mouth daily.    Marland Kitchen atorvastatin (LIPITOR) 20 MG tablet Take 1 tablet (20 mg total) by mouth daily. 90 tablet 3  . Cholecalciferol (VITAMIN D3) 25 MCG (1000 UT) CAPS Take by mouth.    . citalopram (CELEXA) 10 MG tablet TAKE 1 TABLET (10 MG TOTAL) DAILY BY MOUTH. 90 tablet 1  . Cranberry 500 MG CAPS Take by mouth.    . Multiple Vitamins-Minerals (WOMENS MULTIVITAMIN PLUS PO) Take by mouth. (Patient not taking: No sig reported)    . OTEZLA 30 MG TABS Take 1 tablet by mouth 2 (two) times daily.    . psyllium (METAMUCIL) 58.6 % packet Take 1 packet by mouth  daily.     Marland Kitchen triamcinolone cream (KENALOG) 0.1 % Apply 1 application topically 2 (two) times daily.     . Zinc 50 MG TABS Take by mouth.     No current facility-administered medications for this visit.    Patient confirms/reports the following allergies:  Allergies  Allergen Reactions  . Penicillins     Orders Placed This Encounter  Procedures  . Procedural/ Surgical Case Request: COLONOSCOPY WITH PROPOFOL    Standing Status:   Standing    Number of Occurrences:   1    Order Specific Question:   Pre-op diagnosis    Answer:   positive cologuard    Order Specific Question:   CPT Code    Answer:   09381    AUTHORIZATION INFORMATION Primary Insurance: 1D#: Group #:  Secondary Insurance: 1D#: Group #:  SCHEDULE INFORMATION: Date: 04/10/20 Time: Location:[facility]

## 2020-03-27 DIAGNOSIS — Z947 Corneal transplant status: Secondary | ICD-10-CM | POA: Diagnosis not present

## 2020-03-29 ENCOUNTER — Encounter: Payer: Self-pay | Admitting: Family Medicine

## 2020-04-06 ENCOUNTER — Other Ambulatory Visit
Admission: RE | Admit: 2020-04-06 | Discharge: 2020-04-06 | Disposition: A | Discharge status: Home / Self Care | Payer: Medicare Other | Source: Ambulatory Visit | Attending: Gastroenterology | Admitting: Gastroenterology

## 2020-04-06 ENCOUNTER — Other Ambulatory Visit: Payer: Self-pay

## 2020-04-06 DIAGNOSIS — Z01812 Encounter for preprocedural laboratory examination: Secondary | ICD-10-CM | POA: Diagnosis not present

## 2020-04-06 DIAGNOSIS — U071 COVID-19: Secondary | ICD-10-CM | POA: Diagnosis not present

## 2020-04-06 LAB — SARS CORONAVIRUS 2 (TAT 6-24 HRS): SARS Coronavirus 2: POSITIVE — AB

## 2020-04-07 ENCOUNTER — Telehealth: Payer: Self-pay | Admitting: Family

## 2020-04-07 NOTE — Telephone Encounter (Signed)
Called to discuss with patient about COVID-19 symptoms and the use of one of the available treatments for those with mild to moderate Covid symptoms and at a high risk of hospitalization.  Pt appears to qualify for outpatient treatment due to co-morbid conditions and/or a member of an at-risk group in accordance with the FDA Emergency Use Authorization.    Symptom onset:  Vaccinated: Booster?  Immunocompromised? Yes Qualifiers: Age, hypertension, immunosuppresion   Molly Gregory tested positive for COVID during perioperative lab work. I attempted to contact her to gather additional information and was unable to reach her via phone. A voicemail and MyChart message were sent.   Terri Piedra, NP 04/07/2020 10:47 AM

## 2020-04-09 ENCOUNTER — Telehealth: Payer: Self-pay | Admitting: Gastroenterology

## 2020-04-09 NOTE — Telephone Encounter (Signed)
Patient LVM asking for a cll back, she had some questions

## 2020-04-16 DIAGNOSIS — Z947 Corneal transplant status: Secondary | ICD-10-CM | POA: Diagnosis not present

## 2020-04-18 ENCOUNTER — Ambulatory Visit: Payer: Medicare Other | Admitting: Family Medicine

## 2020-04-23 ENCOUNTER — Encounter: Payer: Self-pay | Admitting: Gastroenterology

## 2020-04-23 ENCOUNTER — Telehealth: Payer: Self-pay | Admitting: Gastroenterology

## 2020-04-23 NOTE — Telephone Encounter (Signed)
Patient called asking for call back. She has some questions regarding her procedure

## 2020-04-24 ENCOUNTER — Other Ambulatory Visit: Payer: Self-pay

## 2020-04-24 ENCOUNTER — Ambulatory Visit
Admission: RE | Admit: 2020-04-24 | Discharge: 2020-04-24 | Disposition: A | Discharge status: Home / Self Care | Payer: Medicare Other | Attending: Gastroenterology | Admitting: Gastroenterology

## 2020-04-24 ENCOUNTER — Telehealth: Payer: Self-pay | Admitting: Gastroenterology

## 2020-04-24 ENCOUNTER — Encounter: Payer: Self-pay | Admitting: Gastroenterology

## 2020-04-24 ENCOUNTER — Ambulatory Visit: Payer: Medicare Other | Admitting: Certified Registered"

## 2020-04-24 ENCOUNTER — Encounter
Admission: RE | Disposition: A | Discharge status: Home / Self Care | Payer: Self-pay | Source: Home / Self Care | Attending: Gastroenterology

## 2020-04-24 DIAGNOSIS — D123 Benign neoplasm of transverse colon: Secondary | ICD-10-CM | POA: Insufficient documentation

## 2020-04-24 DIAGNOSIS — Z79899 Other long term (current) drug therapy: Secondary | ICD-10-CM | POA: Insufficient documentation

## 2020-04-24 DIAGNOSIS — D120 Benign neoplasm of cecum: Secondary | ICD-10-CM | POA: Insufficient documentation

## 2020-04-24 DIAGNOSIS — K5790 Diverticulosis of intestine, part unspecified, without perforation or abscess without bleeding: Secondary | ICD-10-CM | POA: Diagnosis not present

## 2020-04-24 DIAGNOSIS — K640 First degree hemorrhoids: Secondary | ICD-10-CM | POA: Diagnosis not present

## 2020-04-24 DIAGNOSIS — Z880 Allergy status to penicillin: Secondary | ICD-10-CM | POA: Insufficient documentation

## 2020-04-24 DIAGNOSIS — D122 Benign neoplasm of ascending colon: Secondary | ICD-10-CM | POA: Insufficient documentation

## 2020-04-24 DIAGNOSIS — K5730 Diverticulosis of large intestine without perforation or abscess without bleeding: Secondary | ICD-10-CM | POA: Diagnosis not present

## 2020-04-24 DIAGNOSIS — K635 Polyp of colon: Secondary | ICD-10-CM | POA: Diagnosis not present

## 2020-04-24 DIAGNOSIS — R195 Other fecal abnormalities: Secondary | ICD-10-CM | POA: Diagnosis not present

## 2020-04-24 HISTORY — PX: COLONOSCOPY WITH PROPOFOL: SHX5780

## 2020-04-24 SURGERY — COLONOSCOPY WITH PROPOFOL
Anesthesia: General

## 2020-04-24 MED ORDER — SODIUM CHLORIDE 0.9 % IV SOLN: INTRAVENOUS | Status: DC

## 2020-04-24 MED ORDER — PROPOFOL 10 MG/ML IV BOLUS
INTRAVENOUS | Status: DC | PRN
  Administered 2020-04-24: 30 mg via INTRAVENOUS
  Administered 2020-04-24: 70 mg via INTRAVENOUS

## 2020-04-24 MED ORDER — PROPOFOL 500 MG/50ML IV EMUL
INTRAVENOUS | Status: DC | PRN
  Administered 2020-04-24: 120 ug/kg/min via INTRAVENOUS

## 2020-04-24 MED ORDER — LIDOCAINE 2% (20 MG/ML) 5 ML SYRINGE
INTRAMUSCULAR | Status: DC | PRN
  Administered 2020-04-24: 25 mg via INTRAVENOUS

## 2020-04-24 NOTE — Anesthesia Postprocedure Evaluation (Signed)
Anesthesia Post Note  Patient: Molly Gregory  Procedure(s) Performed: COLONOSCOPY WITH PROPOFOL (N/A )  Patient location during evaluation: Phase II Anesthesia Type: General Level of consciousness: awake and alert, awake and oriented Pain management: pain level controlled Vital Signs Assessment: post-procedure vital signs reviewed and stable Respiratory status: spontaneous breathing, nonlabored ventilation and respiratory function stable Cardiovascular status: blood pressure returned to baseline and stable Postop Assessment: no apparent nausea or vomiting Anesthetic complications: no   No complications documented.   Last Vitals:  Vitals:   04/24/20 0727 04/24/20 0910  BP: 124/60 127/60  Pulse: 67 67  Resp: 16 10  Temp: (!) 36.3 C (!) 36.3 C  SpO2: 100% 100%    Last Pain:  Vitals:   04/24/20 0910  TempSrc: Temporal  PainSc:                  Phill Mutter

## 2020-04-24 NOTE — Transfer of Care (Signed)
Immediate Anesthesia Transfer of Care Note  Patient: Molly Gregory  Procedure(s) Performed: COLONOSCOPY WITH PROPOFOL (N/A )  Patient Location: Endoscopy Unit  Anesthesia Type:General  Level of Consciousness: awake and alert   Airway & Oxygen Therapy: Patient Spontanous Breathing  Post-op Assessment: Report given to RN and Post -op Vital signs reviewed and stable  Post vital signs: Reviewed  Last Vitals:  Vitals Value Taken Time  BP    Temp    Pulse 66 04/24/20 0917  Resp 13 04/24/20 0917  SpO2 100 % 04/24/20 0917  Vitals shown include unvalidated device data.  Last Pain:  Vitals:   04/24/20 0727  TempSrc: Temporal  PainSc: 0-No pain         Complications: No complications documented.

## 2020-04-24 NOTE — Telephone Encounter (Signed)
Called patient

## 2020-04-24 NOTE — H&P (Signed)
Molly Lame, MD [facility] [address] [place], [place] [address] Phone:[phone] Fax : [phone]  Primary Care Physician:  Leone Haven, MD Primary Gastroenterologist:  Dr. Allen Norris  Pre-Procedure History & Physical: HPI:  Molly Gregory is a 70 y.o. female is here for an colonoscopy.   Past Medical History:  Diagnosis Date  . Allergy   . Anxiety   . Arthritis   . Diabetes mellitus without complication ([place])   . Diverticulosis   . H/O exercise stress test    normal  . H/O psoriasis   . Head injury, closed 2013  . Hiatal hernia   . History of chicken pox   . Hx of acute bronchitis   . Hx of migraines   . Hyperlipidemia   . Hypertension   . Irregular heart beat     Past Surgical History:  Procedure Laterality Date  . CARPAL TUNNEL RELEASE Bilateral    both hands, Dr. Ranae Pila  . CATARACT EXTRACTION Left   . EYE SURGERY Left    cornea transplant, Dr. Sandra Cockayne  . TRIGGER FINGER RELEASE Bilateral   . TUBAL LIGATION      Prior to Admission medications   Medication Sig Start Date End Date Taking? Authorizing Provider  amLODipine (NORVASC) 10 MG tablet TAKE 1 TABLET BY MOUTH EVERY DAY 01/13/20  Yes Leone Haven, MD  albuterol (PROVENTIL HFA;VENTOLIN HFA) 108 (90 Base) MCG/ACT inhaler Inhale 2 puffs into the lungs every 6 (six) hours as needed for wheezing or shortness of breath. Patient not taking: No sig reported 02/13/17   Leone Haven, MD  ascorbic acid (VITAMIN C) 500 MG tablet Take 500 mg by mouth daily.    [provider]  atorvastatin (LIPITOR) 20 MG tablet Take 1 tablet (20 mg total) by mouth daily. 12/21/19   Leone Haven, MD  Cholecalciferol (VITAMIN D3) 25 MCG (1000 UT) CAPS Take by mouth.    [provider]  citalopram (CELEXA) 10 MG tablet TAKE 1 TABLET (10 MG TOTAL) DAILY BY MOUTH. 01/31/20 04/30/20  Leone Haven, MD  Cranberry 500 MG CAPS Take by mouth.    [provider]  Multiple  Vitamins-Minerals (WOMENS MULTIVITAMIN PLUS PO) Take by mouth. Patient not taking: No sig reported    [provider]  OTEZLA 30 MG TABS Take 1 tablet by mouth 2 (two) times daily. 12/23/19   [provider]  psyllium (METAMUCIL) 58.6 % packet Take 1 packet by mouth daily.     [provider]  triamcinolone cream (KENALOG) 0.1 % Apply 1 application topically 2 (two) times daily.     [provider]  Zinc 50 MG TABS Take by mouth.    [provider]    Allergies as of 03/22/2020 - Review Complete 03/22/2020  Allergen Reaction Noted  . Penicillins  12/16/2012    Family History  Problem Relation Age of Onset  . Hypertension Maternal Aunt   . Aneurysm Maternal Aunt   . Kidney failure Maternal Aunt   . Diabetes Maternal Grandmother   . Heart disease Maternal Grandmother   . Hypertension Maternal Grandmother   . Diabetes Paternal Grandmother   . Heart disease Paternal Grandmother   . Hypertension Paternal Grandmother   . Kidney cancer Cousin   . Bladder Cancer Neg Hx     Social History   Socioeconomic History  . Marital status: Married    Spouse name: Not on file  . Number of children: Not on file  .  Years of education: Not on file  . Highest education level: Not on file  Occupational History  . Not on file  Tobacco Use  . Smoking status: Never Smoker  . Smokeless tobacco: Never Used  Vaping Use  . Vaping Use: Never used  Substance and Sexual Activity  . Alcohol use: No  . Drug use: No  . Sexual activity: Not Currently  Other Topics Concern  . Not on file  Social History Narrative   Lives with husband in [place]. Cat in home. 1 daughter and 2 sons. 7 grandchildren.      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      Diet - healthy      Exercise - Walking video every day   Social Determinants of Radio broadcast assistant Strain: Not on file  Food Insecurity: Not on file  Transportation Needs: Not on file  Physical Activity: Not on  file  Stress: Not on file  Social Connections: Not on file  Intimate Partner Violence: Not on file    Review of Systems: See HPI, otherwise negative ROS  Physical Exam: BP 124/60   Pulse 67   Temp (!) 97.3 F (36.3 C) (Temporal)   Resp 16   Ht 5\' 3"  (1.6 m)   Wt 51.7 kg   SpO2 100%   BMI 20.19 kg/m  General:   Alert,  pleasant and cooperative in NAD Head:  Normocephalic and atraumatic. Neck:  Supple; no masses or thyromegaly. Lungs:  Clear throughout to auscultation.    Heart:  Regular rate and rhythm. Abdomen:  Soft, nontender and nondistended. Normal bowel sounds, without guarding, and without rebound.   Neurologic:  Alert and  oriented x4;  grossly normal neurologically.  Impression/Plan: Molly Gregory is here for an colonoscopy to be performed for positive cologaurd  Risks, benefits, limitations, and alternatives regarding  colonoscopy have been reviewed with the patient.  Questions have been answered.  All parties agreeable.   Molly Lame, MD  04/24/2020, 8:33 AM

## 2020-04-24 NOTE — Telephone Encounter (Signed)
My mom had her colonoscopy today and she has a tremendous amount of blood in the toilet and she doesn't know who to call. She tried to call through to the number they provided but no answer .... Per her daughter Ivin Booty.

## 2020-04-24 NOTE — Telephone Encounter (Signed)
Duplicate message. Pt has already been contacted by Dr. Allen Norris for advise.

## 2020-04-24 NOTE — Op Note (Signed)
[facility] Patient Name: Molly Gregory Procedure Date: 04/24/2020 8:32 AM MRN: [id number] Account #: [id number] Date of Birth: [birth year] Admit Type: Outpatient Age: 70 Room: [facility] ENDO ROOM 4 Gender: Female Note Status: Finalized Procedure:             Colonoscopy Indications:           Positive Cologuard test Providers:             [name] MD, MD Referring MD:          Angela Adam. Caryl Bis (Referring MD) Medicines:             Propofol per Anesthesia Complications:         No immediate complications. Procedure:             Pre-Anesthesia Assessment:                        - Prior to the procedure, a History and Physical was                         performed, and patient medications and allergies were                         reviewed. The patient's tolerance of previous                         anesthesia was also reviewed. The risks and benefits                         of the procedure and the sedation options and risks                         were discussed with the patient. All questions were                         answered, and informed consent was obtained. Prior                         Anticoagulants: The patient has taken no previous                         anticoagulant or antiplatelet agents. ASA Grade                         Assessment: II - A patient with mild systemic disease.                         After reviewing the risks and benefits, the patient                         was deemed in satisfactory condition to undergo the                         procedure.                        After obtaining informed consent, the colonoscope was  passed under direct vision. Throughout the procedure,                         the patient's blood pressure, pulse, and oxygen                         saturations were monitored continuously. The                         Colonoscope was introduced through the anus and                          advanced to the the cecum, identified by appendiceal                         orifice and ileocecal valve. The colonoscopy was                         performed without difficulty. The patient tolerated                         the procedure well. The quality of the bowel                         preparation was excellent. Findings:      The perianal and digital rectal examinations were normal.      A 2 mm polyp was found in the cecum. The polyp was sessile. The polyp       was removed with a cold biopsy forceps. Resection and retrieval were       complete.      A localized area of nodular mucosa was found at the appendiceal orifice.       Biopsies were taken with a cold forceps for histology.      Four sessile polyps were found in the ascending colon. The polyps were 4       to 7 mm in size. These polyps were removed with a cold snare. Resection       and retrieval were complete.      Five sessile polyps were found in the transverse colon. The polyps were       3 to 7 mm in size. These polyps were removed with a cold snare.       Resection and retrieval were complete.      Multiple small-mouthed diverticula were found in the sigmoid colon.      Non-bleeding internal hemorrhoids were found during retroflexion. The       hemorrhoids were Grade I (internal hemorrhoids that do not prolapse). Impression:            - One 2 mm polyp in the cecum, removed with a cold                         biopsy forceps. Resected and retrieved.                        - Nodular mucosa at the appendiceal orifice. Biopsied.                        - Four 4 to 7 mm polyps in the ascending colon,  removed with a cold snare. Resected and retrieved.                        - Five 3 to 7 mm polyps in the transverse colon,                         removed with a cold snare. Resected and retrieved.                        - Diverticulosis in the sigmoid colon.                        -  Non-bleeding internal hemorrhoids. Recommendation:        - Discharge patient to home.                        - Resume previous diet.                        - Continue present medications.                        - Await pathology results.                        - Repeat colonoscopy in 1 year for surveillance.                        - If biopsies of the appendiceal orifce shows adenoma                         then will need surgical vs endoscopic resection. Procedure Code(s):     --- Professional ---                        [phone], Colonoscopy, flexible; with removal of                         tumor(s), polyp(s), or other lesion(s) by snare                         technique                        45380, 18, Colonoscopy, flexible; with biopsy, single                         or multiple Diagnosis Code(s):     --- Professional ---                        R19.5, Other fecal abnormalities                        K63.5, Polyp of colon                        K63.89, Other specified diseases of intestine CPT copyright 2019 American [employer] Association. All rights reserved. The codes documented in this report are preliminary and upon coder review may  be revised to meet current compliance requirements. [name] MD, MD 04/24/2020 9:16:44 AM This report  has been signed electronically. Number of Addenda: 0 Note Initiated On: 04/24/2020 8:32 AM Scope Withdrawal Time: 0 hours 15 minutes 31 seconds  Total Procedure Duration: 0 hours 32 minutes 14 seconds  Estimated Blood Loss:  Estimated blood loss: none.      [facility]

## 2020-04-24 NOTE — Telephone Encounter (Signed)
Patient had procedure this morning, she states that she had "a lot" of blood when using the bathroom this afternoon. Please call to advise.

## 2020-04-24 NOTE — Anesthesia Preprocedure Evaluation (Signed)
Anesthesia Evaluation  Patient identified by MRN, date of birth, ID band Patient awake    Reviewed: Allergy & Precautions, H&P , NPO status , Patient's Chart, lab work & pertinent test results  Airway Mallampati: II  TM Distance: >3 FB Neck ROM: Full    Dental no notable dental hx.    Pulmonary neg pulmonary ROS,    Pulmonary exam normal        Cardiovascular hypertension, Pt. on medications negative cardio ROS Normal cardiovascular exam     Neuro/Psych  Headaches, Anxiety  Neuromuscular disease negative psych ROS   GI/Hepatic Neg liver ROS, hiatal hernia, Bowel prep,  Endo/Other  negative endocrine ROSdiabetes, Well Controlled  Renal/GU negative Renal ROS  negative genitourinary   Musculoskeletal  (+) Arthritis , Osteoarthritis,    Abdominal   Peds negative pediatric ROS (+)  Hematology negative hematology ROS (+)   Anesthesia Other Findings Positive Cologard HTN Hematuria Chronic headache Corneal dystrophy Prediabetes Anxiety Hyperlipidemia Minor opacity of both corneas PMB (postmenopausal bleeding)  Pseudophakia of both eyes  Status post corneal transplant  Status post YAG capsulotomy of both eyes     Reproductive/Obstetrics negative OB ROS                            Anesthesia Physical Anesthesia Plan  ASA: II  Anesthesia Plan: General   Post-op Pain Management:    Induction: Intravenous  PONV Risk Score and Plan: 2 and Propofol infusion and TIVA  Airway Management Planned: Natural Airway and Nasal Cannula  Additional Equipment:   Intra-op Plan:   Post-operative Plan:   Informed Consent: I have reviewed the patients History and Physical, chart, labs and discussed the procedure including the risks, benefits and alternatives for the proposed anesthesia with the patient or authorized representative who has indicated his/her understanding and acceptance.        Plan Discussed with: CRNA and Anesthesiologist  Anesthesia Plan Comments:         Anesthesia Quick Evaluation

## 2020-04-25 ENCOUNTER — Encounter: Payer: Self-pay | Admitting: Gastroenterology

## 2020-04-25 LAB — SURGICAL PATHOLOGY

## 2020-05-10 DIAGNOSIS — Z85828 Personal history of other malignant neoplasm of skin: Secondary | ICD-10-CM | POA: Diagnosis not present

## 2020-05-10 DIAGNOSIS — C44722 Squamous cell carcinoma of skin of right lower limb, including hip: Secondary | ICD-10-CM | POA: Diagnosis not present

## 2020-05-10 DIAGNOSIS — C44729 Squamous cell carcinoma of skin of left lower limb, including hip: Secondary | ICD-10-CM | POA: Diagnosis not present

## 2020-05-10 DIAGNOSIS — X32XXXA Exposure to sunlight, initial encounter: Secondary | ICD-10-CM | POA: Diagnosis not present

## 2020-05-10 DIAGNOSIS — L570 Actinic keratosis: Secondary | ICD-10-CM | POA: Diagnosis not present

## 2020-05-10 DIAGNOSIS — D485 Neoplasm of uncertain behavior of skin: Secondary | ICD-10-CM | POA: Diagnosis not present

## 2020-05-10 DIAGNOSIS — D2371 Other benign neoplasm of skin of right lower limb, including hip: Secondary | ICD-10-CM | POA: Diagnosis not present

## 2020-05-10 DIAGNOSIS — L400 Psoriasis vulgaris: Secondary | ICD-10-CM | POA: Diagnosis not present

## 2020-05-10 DIAGNOSIS — Z08 Encounter for follow-up examination after completed treatment for malignant neoplasm: Secondary | ICD-10-CM | POA: Diagnosis not present

## 2020-05-16 DIAGNOSIS — Z947 Corneal transplant status: Secondary | ICD-10-CM | POA: Diagnosis not present

## 2020-05-17 DIAGNOSIS — Z1231 Encounter for screening mammogram for malignant neoplasm of breast: Secondary | ICD-10-CM | POA: Diagnosis not present

## 2020-05-17 LAB — HM MAMMOGRAPHY

## 2020-05-22 ENCOUNTER — Ambulatory Visit: Payer: Medicare Other | Admitting: Family Medicine

## 2020-06-22 DIAGNOSIS — D485 Neoplasm of uncertain behavior of skin: Secondary | ICD-10-CM | POA: Diagnosis not present

## 2020-06-22 DIAGNOSIS — C44729 Squamous cell carcinoma of skin of left lower limb, including hip: Secondary | ICD-10-CM | POA: Diagnosis not present

## 2020-06-28 ENCOUNTER — Other Ambulatory Visit: Payer: Self-pay

## 2020-07-04 ENCOUNTER — Ambulatory Visit: Payer: Self-pay | Admitting: Urology

## 2020-07-04 ENCOUNTER — Ambulatory Visit: Payer: Medicare Other | Admitting: Family Medicine

## 2020-07-05 DIAGNOSIS — C44722 Squamous cell carcinoma of skin of right lower limb, including hip: Secondary | ICD-10-CM | POA: Diagnosis not present

## 2020-07-05 DIAGNOSIS — C44729 Squamous cell carcinoma of skin of left lower limb, including hip: Secondary | ICD-10-CM | POA: Diagnosis not present

## 2020-07-09 ENCOUNTER — Ambulatory Visit: Payer: Medicare Other | Admitting: Family Medicine

## 2020-07-12 ENCOUNTER — Encounter: Payer: Self-pay | Admitting: Family Medicine

## 2020-07-12 ENCOUNTER — Ambulatory Visit (INDEPENDENT_AMBULATORY_CARE_PROVIDER_SITE_OTHER): Payer: Medicare Other | Admitting: Family Medicine

## 2020-07-12 ENCOUNTER — Other Ambulatory Visit: Payer: Self-pay

## 2020-07-12 VITALS — BP 120/80 | HR 68 | Temp 97.7°F | Ht 64.0 in | Wt 118.2 lb

## 2020-07-12 DIAGNOSIS — E785 Hyperlipidemia, unspecified: Secondary | ICD-10-CM | POA: Diagnosis not present

## 2020-07-12 DIAGNOSIS — I10 Essential (primary) hypertension: Secondary | ICD-10-CM

## 2020-07-12 DIAGNOSIS — R7303 Prediabetes: Secondary | ICD-10-CM

## 2020-07-12 DIAGNOSIS — R0789 Other chest pain: Secondary | ICD-10-CM | POA: Diagnosis not present

## 2020-07-12 LAB — BASIC METABOLIC PANEL
BUN: 17 mg/dL (ref 6–23)
CO2: 30 mEq/L (ref 19–32)
Calcium: 9.6 mg/dL (ref 8.4–10.5)
Chloride: 103 mEq/L (ref 96–112)
Creatinine, Ser: 0.59 mg/dL (ref 0.40–1.20)
GFR: 90.46 mL/min (ref >60.00)
Glucose, Bld: 112 mg/dL — ABNORMAL HIGH (ref 70–99)
Potassium: 3.6 mEq/L (ref 3.5–5.1)
Sodium: 142 mEq/L (ref 135–145)

## 2020-07-12 LAB — HEMOGLOBIN A1C: Hgb A1c MFr Bld: 6.2 % (ref 4.6–6.5)

## 2020-07-12 NOTE — Assessment & Plan Note (Signed)
Currently well controlled.  She will continue amlodipine 10 mg once daily and losartan 50 mg once daily.  We will check a BMP.

## 2020-07-12 NOTE — Assessment & Plan Note (Signed)
Check A1c. 

## 2020-07-12 NOTE — Patient Instructions (Signed)
Nice to see you. We will get lab work today. If you develop chest pressure, shortness of breath, persistent chest pain, or any other symptoms that are new please seek medical attention.

## 2020-07-12 NOTE — Assessment & Plan Note (Signed)
Previously well controlled.  She will continue Lipitor 20 mg once daily. ?

## 2020-07-12 NOTE — Progress Notes (Signed)
[name], MD Phone: [phone]  Molly Gregory is a 70 y.o. female who presents today for f/u.  HYPERTENSION  Disease Monitoring  Home BP Monitoring was running 130s-140s then she restarted losartan, now BP has come down to the 120s/55-70 Chest pain- see below    Dyspnea- no Medications  Compliance-  Taking losartan 50 mg daily for the past 2-3 weeks, amlodipine 10 mg. Lightheadedness-  no  Edema- no  HYPERLIPIDEMIA Symptoms Chest pain on exertion:  no   Medications: Compliance- taking lipitor Right upper quadrant pain- no  Muscle aches- no  Chest pain: Patient notes a left-sided chest soreness that occurs at times typically when she is sitting at rest.  She feels as though it is a muscular strain from exercising.  Does not occur with exercise and she does exercise fairly frequently and intensely.  No radiation.  No diaphoresis.  No shortness of breath.  She did have a stress test in 2017 that was read as a low risk scan.  The patient also notes tenderness in the left chest musculature when she feels the discomfort.    Social History   Tobacco Use  Smoking Status Never Smoker  Smokeless Tobacco Never Used    Current Outpatient Medications on File Prior to Visit  Medication Sig Dispense Refill  . albuterol (PROVENTIL HFA;VENTOLIN HFA) 108 (90 Base) MCG/ACT inhaler Inhale 2 puffs into the lungs every 6 (six) hours as needed for wheezing or shortness of breath. 1 Inhaler 0  . amLODipine (NORVASC) 10 MG tablet TAKE 1 TABLET BY MOUTH EVERY DAY 90 tablet 2  . ascorbic acid (VITAMIN C) 500 MG tablet Take 500 mg by mouth daily.    Marland Kitchen atorvastatin (LIPITOR) 20 MG tablet Take 1 tablet (20 mg total) by mouth daily. 90 tablet 3  . Cholecalciferol (VITAMIN D3) 25 MCG (1000 UT) CAPS Take by mouth.    . Cranberry 500 MG CAPS Take by mouth.    . losartan (COZAAR) 50 MG tablet Take 50 mg by mouth daily.    . Multiple Vitamins-Minerals (WOMENS MULTIVITAMIN PLUS PO) Take by mouth.     . OTEZLA 30 MG TABS Take 1 tablet by mouth 2 (two) times daily.    . psyllium (METAMUCIL) 58.6 % packet Take 1 packet by mouth daily.     Marland Kitchen triamcinolone cream (KENALOG) 0.1 % Apply 1 application topically 2 (two) times daily.     . Zinc 50 MG TABS Take by mouth.    . citalopram (CELEXA) 10 MG tablet TAKE 1 TABLET (10 MG TOTAL) DAILY BY MOUTH. 90 tablet 1  . estradiol (ESTRACE) 0.1 MG/GM vaginal cream Place vaginally.    . prednisoLONE acetate (PRED FORTE) 1 % ophthalmic suspension 1 drop 4 (four) times daily.     No current facility-administered medications on file prior to visit.     ROS see history of present illness  Objective  Physical Exam Vitals:   07/12/20 0807  BP: 120/80  Pulse: 68  Temp: 97.7 F (36.5 C)  SpO2: 99%    BP Readings from Last 3 Encounters:  07/12/20 120/80  04/24/20 (!) 144/69  01/05/20 (!) 148/65   Wt Readings from Last 3 Encounters:  07/12/20 118 lb 3.2 oz (53.6 kg)  04/24/20 114 lb (51.7 kg)  01/05/20 115 lb (52.2 kg)    Physical Exam Constitutional:      General: She is not in acute distress.    Appearance: She is not diaphoretic.  Cardiovascular:  Rate and Rhythm: Normal rate and regular rhythm.     Heart sounds: Normal heart sounds.  Pulmonary:     Effort: Pulmonary effort is normal.     Breath sounds: Normal breath sounds.  Musculoskeletal:     Right lower leg: No edema.     Left lower leg: No edema.  Skin:    General: Skin is warm and dry.  Neurological:     Mental Status: She is alert.    EKG: Normal sinus rhythm, rate 64, single PAC noted, T wave inversion in lead III is chronic, no apparent ischemic changes, EKG is stable compared to previously with the exception of the PACs  Assessment/Plan: Please see individual problem list.  Problem List Items Addressed This Visit    Essential hypertension, benign    Currently well controlled.  She will continue amlodipine 10 mg once daily and losartan 50 mg once daily.  We  will check a BMP.      Relevant Medications   losartan (COZAAR) 50 MG tablet   Other Relevant Orders   Basic Metabolic Panel (BMET)   Atypical chest pain - Primary    Patient's chest pain overall is very atypical.  It is suspicious for a muscular cause.  Other than the location there are no typical cardiac factors.  EKG will be performed today and is reassuring.  I do not believe this is a cardiac issue.  History is not consistent with a VTE.  Lung exam is clear making pneumonia or lung issue less likely as well.  Vitals are stable.  She will monitor and seek medical attention if this becomes persistent or if it changes.      Relevant Orders   EKG 12-Lead (Completed)   Prediabetes    Check A1c.      Relevant Orders   HgB A1c   Hyperlipidemia    Previously well controlled.  She will continue Lipitor 20 mg once daily.      Relevant Medications   losartan (COZAAR) 50 MG tablet      Return in about 6 months (around 01/12/2021) for HTN.  This visit occurred during the SARS-CoV-2 public health emergency.  Safety protocols were in place, including screening questions prior to the visit, additional usage of staff PPE, and extensive cleaning of exam room while observing appropriate contact time as indicated for disinfecting solutions.    [name], MD [place]

## 2020-07-12 NOTE — Assessment & Plan Note (Addendum)
Patient's chest pain overall is very atypical.  It is suspicious for a muscular cause.  Other than the location there are no typical cardiac factors.  EKG will be performed today and is reassuring.  I do not believe this is a cardiac issue.  History is not consistent with a VTE.  Lung exam is clear making pneumonia or lung issue less likely as well.  Vitals are stable.  She will monitor and seek medical attention if this becomes persistent or if it changes.

## 2020-07-16 ENCOUNTER — Ambulatory Visit (INDEPENDENT_AMBULATORY_CARE_PROVIDER_SITE_OTHER): Payer: Medicare Other | Admitting: Cardiology

## 2020-07-16 ENCOUNTER — Encounter: Payer: Self-pay | Admitting: Cardiology

## 2020-07-16 ENCOUNTER — Other Ambulatory Visit: Payer: Self-pay

## 2020-07-16 VITALS — BP 128/50 | HR 72 | Ht 64.0 in | Wt 119.0 lb

## 2020-07-16 DIAGNOSIS — R072 Precordial pain: Secondary | ICD-10-CM | POA: Diagnosis not present

## 2020-07-16 DIAGNOSIS — R600 Localized edema: Secondary | ICD-10-CM | POA: Diagnosis not present

## 2020-07-16 DIAGNOSIS — Z48817 Encounter for surgical aftercare following surgery on the skin and subcutaneous tissue: Secondary | ICD-10-CM | POA: Diagnosis not present

## 2020-07-16 DIAGNOSIS — I10 Essential (primary) hypertension: Secondary | ICD-10-CM

## 2020-07-16 NOTE — Patient Instructions (Signed)
Medication Instructions:  Your physician recommends that you continue on your current medications as directed. Please refer to the Current Medication list given to you today.  *If you need a refill on your cardiac medications before your next appointment, please call your pharmacy*   Lab Work: None ordered If you have labs (blood work) drawn today and your tests are completely normal, you will receive your results only by: Marland Kitchen MyChart Message (if you have MyChart) OR . A paper copy in the mail If you have any lab test that is abnormal or we need to change your treatment, we will call you to review the results.   Testing/Procedures:  1.  Your physician has requested that you have a lower extremity venous duplex with reflux. This test is an ultrasound of the veins in the legs. It looks at venous blood flow that carries blood from the heart to the legs or arms. Allow one hour for a Lower Venous exam. Allow thirty minutes for an Upper Venous exam. There are no restrictions or special instructions.     Follow-Up: At [facility], you and your health needs are our priority.  As part of our continuing mission to provide you with exceptional heart care, we have created designated Provider Care Teams.  These Care Teams include your primary Cardiologist (physician) and Advanced Practice Providers (APPs -  Physician Assistants and Nurse Practitioners) who all work together to provide you with the care you need, when you need it.  We recommend signing up for the patient portal called "MyChart".  Sign up information is provided on this After Visit Summary.  MyChart is used to connect with patients for Virtual Visits (Telemedicine).  Patients are able to view lab/test results, encounter notes, upcoming appointments, etc.  Non-urgent messages can be sent to your provider as well.   To learn more about what you can do with MyChart, go to [email or web address].    Your next appointment:   Follow up  after testing   The format for your next appointment:   In Person  Provider:   Kate Sable, MD   Other Instructions

## 2020-07-16 NOTE — Progress Notes (Signed)
Cardiology Office Note:    Date:  07/16/2020   ID:  Molly Gregory, DOB 1951-02-10, MRN [id number]  PCP:  Molly Haven, MD   [facility] Providers Cardiologist:  None     Referring MD: Molly Haven, MD   Chief Complaint  Patient presents with  . New patient    Increase leg swelling, fluid build up. Intermittent chest discomfort. Meds reviewed verbally with patient.     History of Present Illness:    Molly Gregory is a 70 y.o. female with a hx of hypertension, hyperlipidemia, neuropathy who presents due to lower extremity edema and chest discomfort.  Patient being seen in cell plant-based diet.  She had some salty/barbecue foods 3 days ago, resulting in lower extremity edema, worsening over the subsequent 2 days.  Edema seems to be better today.  Has been on amlodipine for years.  Endorsed having varicose veins.  Also endorsed having left-sided chest pain which is reproducible with palpation.  Performs upper body exercises.  Previously seen in 2017 with symptoms of chest discomfort, Lexiscan Myoview 12/2015 was normal.  Past Medical History:  Diagnosis Date  . Allergy   . Anxiety   . Arthritis   . Diabetes mellitus without complication ([place])   . Diverticulosis   . H/O exercise stress test    normal  . H/O psoriasis   . Head injury, closed 2013  . Hiatal hernia   . History of chicken pox   . Hx of acute bronchitis   . Hx of migraines   . Hyperlipidemia   . Hypertension   . Irregular heart beat     Past Surgical History:  Procedure Laterality Date  . CARPAL TUNNEL RELEASE Bilateral    both hands, Dr. Ranae Pila  . CATARACT EXTRACTION Left   . COLONOSCOPY WITH PROPOFOL N/A 04/24/2020   Procedure: COLONOSCOPY WITH PROPOFOL;  Surgeon: [name], MD;  Location: [facility] ENDOSCOPY;  Service: Endoscopy;  Laterality: N/A;  COVID POSITIVE ON 04/09/2020 - ASYMPTOMATIC  . EYE SURGERY Left    cornea transplant, Dr. Sandra Cockayne  . TRIGGER FINGER RELEASE  Bilateral   . TUBAL LIGATION      Current Medications: Current Meds  Medication Sig  . albuterol (PROVENTIL HFA;VENTOLIN HFA) 108 (90 Base) MCG/ACT inhaler Inhale 2 puffs into the lungs every 6 (six) hours as needed for wheezing or shortness of breath.  Marland Kitchen amLODipine (NORVASC) 10 MG tablet TAKE 1 TABLET BY MOUTH EVERY DAY  . ascorbic acid (VITAMIN C) 500 MG tablet Take 500 mg by mouth daily.  Marland Kitchen atorvastatin (LIPITOR) 20 MG tablet Take 1 tablet (20 mg total) by mouth daily.  . Cholecalciferol (VITAMIN D3) 25 MCG (1000 UT) CAPS Take by mouth.  . citalopram (CELEXA) 10 MG tablet TAKE 1 TABLET (10 MG TOTAL) DAILY BY MOUTH.  . Cranberry 500 MG CAPS Take by mouth.  . doxycycline (VIBRAMYCIN) 100 MG capsule Take 100 mg by mouth 2 (two) times daily.  Marland Kitchen losartan (COZAAR) 25 MG tablet Take 25 mg by mouth daily.  . Multiple Vitamins-Minerals (WOMENS MULTIVITAMIN PLUS PO) Take by mouth.  . OTEZLA 30 MG TABS Take 1 tablet by mouth 2 (two) times daily.  . prednisoLONE acetate (PRED FORTE) 1 % ophthalmic suspension 1 drop 4 (four) times daily.  . psyllium (METAMUCIL) 58.6 % packet Take 1 packet by mouth daily.   Marland Kitchen triamcinolone cream (KENALOG) 0.1 % Apply 1 application topically 2 (two) times daily.   . Zinc 50 MG TABS Take  by mouth.     Allergies:   Penicillins   Social History   Socioeconomic History  . Marital status: Married    Spouse name: Not on file  . Number of children: Not on file  . Years of education: Not on file  . Highest education level: Not on file  Occupational History  . Not on file  Tobacco Use  . Smoking status: Never Smoker  . Smokeless tobacco: Never Used  Vaping Use  . Vaping Use: Never used  Substance and Sexual Activity  . Alcohol use: No  . Drug use: No  . Sexual activity: Not Currently  Other Topics Concern  . Not on file  Social History Narrative   Lives with husband in [place]. Cat in home. 1 daughter and 2 sons. 7 grandchildren.      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      Diet - healthy      Exercise - Walking video every day   Social Determinants of Radio broadcast assistant Strain: Not on file  Food Insecurity: Not on file  Transportation Needs: Not on file  Physical Activity: Not on file  Stress: Not on file  Social Connections: Not on file     Family History: The patient's family history includes Aneurysm in her maternal aunt; Diabetes in her maternal grandmother and paternal grandmother; Heart disease in her maternal grandmother and paternal grandmother; Hypertension in her maternal aunt, maternal grandmother, and paternal grandmother; Kidney cancer in her cousin; Kidney failure in her maternal aunt. There is no history of Bladder Cancer.  ROS:   Please see the history of present illness.     All other systems reviewed and are negative.  EKGs/Labs/Other Studies Reviewed:    The following studies were reviewed today:   EKG:  EKG not ordered today.    Recent EKG obtained at PCP on 07/12/2020, sinus rhythm, possible old septal infarct. Recent Labs: 02/07/2020: ALT 23 07/12/2020: BUN 17; Creatinine, Ser 0.59; Potassium 3.6; Sodium 142  Recent Lipid Panel    Component Value Date/Time   CHOL 152 10/17/2019 0903   TRIG 53.0 10/17/2019 0903   HDL 75.40 10/17/2019 0903   CHOLHDL 2 10/17/2019 0903   VLDL 10.6 10/17/2019 0903   LDLCALC 66 10/17/2019 0903   LDLDIRECT 40.0 02/07/2020 0837     Risk Assessment/Calculations:      Physical Exam:    VS:  BP (!) 128/50 (BP Location: Left Arm, Patient Position: Sitting, Cuff Size: Normal)   Pulse 72   Ht 5\' 4"  (1.626 m)   Wt 119 lb (54 kg)   SpO2 98%   BMI 20.43 kg/m     Wt Readings from Last 3 Encounters:  07/16/20 119 lb (54 kg)  07/12/20 118 lb 3.2 oz (53.6 kg)  04/24/20 114 lb (51.7 kg)     GEN:  Well nourished, well developed in no acute distress HEENT: Normal NECK: No JVD; No carotid bruits LYMPHATICS: No lymphadenopathy CARDIAC: RRR, no murmurs, rubs,  gallops RESPIRATORY:  Clear to auscultation without rales, wheezing or rhonchi  ABDOMEN: Soft, non-tender, non-distended MUSCULOSKELETAL:  trace edema; erythema, nontender, varicose veins noted SKIN: Warm and dry NEUROLOGIC:  Alert and oriented x 3 PSYCHIATRIC:  Normal affect   ASSESSMENT:    1. Leg edema   2. Primary hypertension   3. Precordial pain    PLAN:    In order of problems listed above:  1. Trace edema improved over the past day.  Likely  from eating salty foods, varicose veins noted on exam.  Obtain ultrasound of the lower extremity with reflux to evaluate venous insufficiency.  Leg raised, low-salt diet advised.  Etiology not likely cardiac. 2. Hypertension, BP controlled.  Continue current BP meds. 3. Chest pain, noncardiac in origin, reproducible with palpation.  Patient made aware, reassured.  Previous stress test 2017 was normal.  Follow-up after lower extremity ultrasound.     Medication Adjustments/Labs and Tests Ordered: Current medicines are reviewed at length with the patient today.  Concerns regarding medicines are outlined above.  Orders Placed This Encounter  Procedures  . VAS [place] LOWER EXTREMITY VENOUS REFLUX   No orders of the defined types were placed in this encounter.   Patient Instructions  Medication Instructions:  Your physician recommends that you continue on your current medications as directed. Please refer to the Current Medication list given to you today.  *If you need a refill on your cardiac medications before your next appointment, please call your pharmacy*   Lab Work: None ordered If you have labs (blood work) drawn today and your tests are completely normal, you will receive your results only by: Marland Kitchen MyChart Message (if you have MyChart) OR . A paper copy in the mail If you have any lab test that is abnormal or we need to change your treatment, we will call you to review the results.   Testing/Procedures:  1.  Your physician  has requested that you have a lower extremity venous duplex with reflux. This test is an ultrasound of the veins in the legs. It looks at venous blood flow that carries blood from the heart to the legs or arms. Allow one hour for a Lower Venous exam. Allow thirty minutes for an Upper Venous exam. There are no restrictions or special instructions.     Follow-Up: At [facility], you and your health needs are our priority.  As part of our continuing mission to provide you with exceptional heart care, we have created designated Provider Care Teams.  These Care Teams include your primary Cardiologist (physician) and Advanced Practice Providers (APPs -  Physician Assistants and Nurse Practitioners) who all work together to provide you with the care you need, when you need it.  We recommend signing up for the patient portal called "MyChart".  Sign up information is provided on this After Visit Summary.  MyChart is used to connect with patients for Virtual Visits (Telemedicine).  Patients are able to view lab/test results, encounter notes, upcoming appointments, etc.  Non-urgent messages can be sent to your provider as well.   To learn more about what you can do with MyChart, go to [email or web address].    Your next appointment:   Follow up after testing   The format for your next appointment:   In Person  Provider:   Kate Sable, MD   Other Instructions      Signed, Kate Sable, MD  07/16/2020 12:49 PM    [place]

## 2020-07-18 ENCOUNTER — Ambulatory Visit (HOSPITAL_COMMUNITY)
Admission: RE | Admit: 2020-07-18 | Discharge: 2020-07-18 | Disposition: A | Discharge status: Home / Self Care | Payer: Medicare Other | Source: Ambulatory Visit | Attending: Cardiology | Admitting: Cardiology

## 2020-07-18 ENCOUNTER — Other Ambulatory Visit: Payer: Self-pay

## 2020-07-18 DIAGNOSIS — R600 Localized edema: Secondary | ICD-10-CM

## 2020-07-19 DIAGNOSIS — C44729 Squamous cell carcinoma of skin of left lower limb, including hip: Secondary | ICD-10-CM | POA: Diagnosis not present

## 2020-07-19 DIAGNOSIS — C44722 Squamous cell carcinoma of skin of right lower limb, including hip: Secondary | ICD-10-CM | POA: Diagnosis not present

## 2020-07-19 DIAGNOSIS — D485 Neoplasm of uncertain behavior of skin: Secondary | ICD-10-CM | POA: Diagnosis not present

## 2020-07-20 ENCOUNTER — Ambulatory Visit: Payer: Medicare Other | Admitting: Internal Medicine

## 2020-07-20 ENCOUNTER — Ambulatory Visit: Payer: Medicare Other | Admitting: Cardiology

## 2020-07-25 ENCOUNTER — Other Ambulatory Visit: Payer: Self-pay | Admitting: Family Medicine

## 2020-08-14 DIAGNOSIS — Z947 Corneal transplant status: Secondary | ICD-10-CM | POA: Diagnosis not present

## 2020-08-28 DIAGNOSIS — L570 Actinic keratosis: Secondary | ICD-10-CM | POA: Diagnosis not present

## 2020-08-28 DIAGNOSIS — C44722 Squamous cell carcinoma of skin of right lower limb, including hip: Secondary | ICD-10-CM | POA: Diagnosis not present

## 2020-08-28 DIAGNOSIS — D485 Neoplasm of uncertain behavior of skin: Secondary | ICD-10-CM | POA: Diagnosis not present

## 2020-09-11 DIAGNOSIS — C44722 Squamous cell carcinoma of skin of right lower limb, including hip: Secondary | ICD-10-CM | POA: Diagnosis not present

## 2020-10-05 ENCOUNTER — Other Ambulatory Visit: Payer: Self-pay | Admitting: Family Medicine

## 2020-11-29 DIAGNOSIS — L400 Psoriasis vulgaris: Secondary | ICD-10-CM | POA: Diagnosis not present

## 2020-11-29 DIAGNOSIS — D2261 Melanocytic nevi of right upper limb, including shoulder: Secondary | ICD-10-CM | POA: Diagnosis not present

## 2020-11-29 DIAGNOSIS — X32XXXA Exposure to sunlight, initial encounter: Secondary | ICD-10-CM | POA: Diagnosis not present

## 2020-11-29 DIAGNOSIS — D225 Melanocytic nevi of trunk: Secondary | ICD-10-CM | POA: Diagnosis not present

## 2020-11-29 DIAGNOSIS — C44722 Squamous cell carcinoma of skin of right lower limb, including hip: Secondary | ICD-10-CM | POA: Diagnosis not present

## 2020-11-29 DIAGNOSIS — Z85828 Personal history of other malignant neoplasm of skin: Secondary | ICD-10-CM | POA: Diagnosis not present

## 2020-11-29 DIAGNOSIS — D485 Neoplasm of uncertain behavior of skin: Secondary | ICD-10-CM | POA: Diagnosis not present

## 2020-11-29 DIAGNOSIS — L570 Actinic keratosis: Secondary | ICD-10-CM | POA: Diagnosis not present

## 2020-11-29 DIAGNOSIS — D2271 Melanocytic nevi of right lower limb, including hip: Secondary | ICD-10-CM | POA: Diagnosis not present

## 2020-11-30 ENCOUNTER — Ambulatory Visit
Admission: RE | Admit: 2020-11-30 | Discharge: 2020-11-30 | Disposition: A | Discharge status: Home / Self Care | Payer: Medicare Other | Source: Ambulatory Visit | Attending: Urology | Admitting: Urology

## 2020-11-30 ENCOUNTER — Ambulatory Visit
Admission: RE | Admit: 2020-11-30 | Discharge: 2020-11-30 | Disposition: A | Discharge status: Home / Self Care | Payer: Medicare Other | Attending: Urology | Admitting: Urology

## 2020-11-30 DIAGNOSIS — N200 Calculus of kidney: Secondary | ICD-10-CM

## 2020-11-30 DIAGNOSIS — I878 Other specified disorders of veins: Secondary | ICD-10-CM | POA: Diagnosis not present

## 2020-11-30 DIAGNOSIS — K8020 Calculus of gallbladder without cholecystitis without obstruction: Secondary | ICD-10-CM | POA: Diagnosis not present

## 2020-12-02 ENCOUNTER — Other Ambulatory Visit: Payer: Self-pay | Admitting: Family Medicine

## 2020-12-03 NOTE — Progress Notes (Signed)
03/16/2018  3:29 PM   Molly Gregory 22-Sep-1950 [id number]  Referring provider: Leone Haven, MD [address] [place],  [place] [address]  Chief Complaint  Patient presents with   Follow-up    Urological history: 1. High risk hematuria -non-smoker -CTU 2017 bilateral nephrolithiasis -cysto 2017 NED -non-contrast CT 2021 - right renal stone -cysto 2021 - NED -no reports of gross heme -UA negative for micro heme  2. Right renal stone -non-contrast CT 2021 -right renal stone -KUB 11/2020 Right-sided nephrolithiasis.  Cholelithiasis.  HPI: Molly Gregory is a 70 y.o. female who presents today for a yearly follow up.  UA negative  KUB 11/2020 - 5 mm right renal stone.   She is experiencing 1-7 daytime urinations, 1-2 episodes of nocturia and her urge to urinate is mild.  She does not have urinary incontinence.  Patient denies any modifying or aggravating factors.  Patient denies any gross hematuria, dysuria or suprapubic/flank pain.  Patient denies any fevers, chills, nausea or vomiting.    PMH: Past Medical History:  Diagnosis Date   Allergy    Anxiety    Arthritis    Diabetes mellitus without complication ([place])    Diverticulosis    H/O exercise stress test    normal   H/O psoriasis    Head injury, closed 2013   Hiatal hernia    History of chicken pox    Hx of acute bronchitis    Hx of migraines    Hyperlipidemia    Hypertension    Irregular heart beat     Surgical History: Past Surgical History:  Procedure Laterality Date   CARPAL TUNNEL RELEASE Bilateral    both hands, Dr. Ranae Pila   CATARACT EXTRACTION Left    COLONOSCOPY WITH PROPOFOL N/A 04/24/2020   Procedure: COLONOSCOPY WITH PROPOFOL;  Surgeon: [name], MD;  Location: [facility] ENDOSCOPY;  Service: Endoscopy;  Laterality: N/A;  COVID POSITIVE ON 04/09/2020 - ASYMPTOMATIC   EYE SURGERY Left    cornea transplant, Dr. Sandra Cockayne   TRIGGER FINGER RELEASE Bilateral    [place] Medications:  Allergies as of 12/04/2020       Reactions   Penicillins         Medication List        Accurate as of December 04, 2020 11:59 PM. If you have any questions, ask your nurse or doctor.          STOP taking these medications    Cranberry 500 MG Caps Stopped by: Bernedette Auston, PA-C   doxycycline 100 MG capsule Commonly known as: VIBRAMYCIN Stopped by: Carder Yin, PA-C       TAKE these medications    albuterol 108 (90 Base) MCG/ACT inhaler Commonly known as: VENTOLIN HFA Inhale 2 puffs into the lungs every 6 (six) hours as needed for wheezing or shortness of breath.   amLODipine 10 MG tablet Commonly known as: NORVASC TAKE 1 TABLET BY MOUTH EVERY DAY   ascorbic acid 500 MG tablet Commonly known as: VITAMIN C Take 500 mg by mouth daily.   atorvastatin 20 MG tablet Commonly known as: LIPITOR TAKE 1 TABLET BY MOUTH EVERY DAY   citalopram 10 MG tablet Commonly known as: CELEXA TAKE 1 TABLET (10 MG TOTAL) DAILY BY MOUTH.   losartan 25 MG tablet Commonly known as: COZAAR Take 25 mg by mouth daily.   Otezla 30 MG Tabs Generic drug: Apremilast Take 1 tablet by mouth 2 (  two) times daily.   prednisoLONE acetate 1 % ophthalmic suspension Commonly known as: PRED FORTE 1 drop 4 (four) times daily.   psyllium 58.6 % packet Commonly known as: METAMUCIL Take 1 packet by mouth daily.   triamcinolone cream 0.1 % Commonly known as: KENALOG Apply 1 application topically 2 (two) times daily.   Vitamin D3 25 MCG (1000 UT) Caps Take by mouth.   WOMENS MULTIVITAMIN PLUS PO Take by mouth.   Zinc 50 MG Tabs Take by mouth.        Allergies:  Allergies  Allergen Reactions   Penicillins     Family History: Family History  Problem Relation Age of Onset   Hypertension Maternal Aunt    Aneurysm Maternal Aunt    Kidney failure Maternal Aunt    Diabetes Maternal Grandmother    Heart disease Maternal Grandmother     Hypertension Maternal Grandmother    Diabetes Paternal Grandmother    Heart disease Paternal Grandmother    Hypertension Paternal Grandmother    Kidney cancer Cousin    Bladder Cancer Neg Hx     Social History:  reports that she has never smoked. She has never used smokeless tobacco. She reports that she does not drink alcohol and does not use drugs.  ROS: For pertinent review of systems please refer to history of present illness  Physical Exam: BP 120/64   Pulse 71   Ht 5\' 4"  (1.626 m)   Wt 115 lb (52.2 kg)   BMI 19.74 kg/m   Constitutional:  Well nourished. Alert and oriented, No acute distress. HEENT: [place] AT, mask in place.  Trachea midline Cardiovascular: No clubbing, cyanosis, or edema. Respiratory: Normal respiratory effort, no increased work of breathing. Neurologic: Grossly intact, no focal deficits, moving all 4 extremities. Psychiatric: Normal mood and affect.    Laboratory Data: Component     Latest Ref Rng & Units 07/12/2020  Hemoglobin A1C     4.6 - 6.5 % 6.2   Component     Latest Ref Rng & Units 07/12/2020  Sodium     135 - 145 mEq/L 142  Potassium     3.5 - 5.1 mEq/L 3.6  Chloride     96 - 112 mEq/L 103  CO2     19 - 32 mEq/L 30  Glucose     70 - 99 mg/dL 112 (H)  BUN     6 - 23 mg/dL 17  Creatinine     0.40 - 1.20 mg/dL 0.59  Calcium     8.4 - 10.5 mg/dL 9.6  GFR     >60.00 mL/min 90.46    Component     Latest Ref Rng & Units 12/04/2020  Specific Gravity, UA     1.005 - 1.030 <1.005 (L)  pH, UA     5.0 - 7.5 6.0  Color, UA     Yellow Straw  Appearance Ur     Clear Clear  Leukocytes,UA     Negative Negative  Protein,UA     Negative/Trace Negative  Glucose, UA     Negative Negative  Ketones, UA     Negative Negative  RBC, UA     Negative Trace (A)  Bilirubin, UA     Negative Negative  Urobilinogen, Ur     0.2 - 1.0 mg/dL 0.2  Nitrite, UA     Negative Negative  Microscopic Examination      See below:   Component     Latest  Ref  Rng & Units 12/04/2020  WBC, UA     0 - 5 /hpf 0-5  RBC     0 - 2 /hpf 0-2  Epithelial Cells (non renal)     0 - 10 /hpf None seen  Bacteria, UA     None seen/Few None seen  I have reviewed the labs.   Pertinent Imaging CLINICAL DATA:  History of kidney stones.   EXAM: ABDOMEN - 1 VIEW   COMPARISON:  02/14/2019   FINDINGS: Bowel gas pattern is nonobstructive. Two moderate size gallstones over the right upper quadrant. 4-5 mm calcification projects over the mid to lower pole right kidney likely right renal stone. No left renal stones. Multiple pelvic phleboliths unchanged. Degenerative changes of the spine.   IMPRESSION: 1. Nonobstructive bowel gas pattern. 2. Right-sided nephrolithiasis. 3. Cholelithiasis.     Electronically Signed   By: Marin Olp M.D.   On: 12/02/2020 08:37 I have independently reviewed the films.  See HPI.   Assessment & Plan:    1. Right renal stone -We discussed various treatment options for urolithiasis including observation with or without medical expulsive therapy, shockwave lithotripsy (SWL), ureteroscopy and laser lithotripsy with stent placement. -We discussed that management is based on stone size, location, density, patient co-morbidities, and patient preference.  -Stones <64mm in size have a >80% spontaneous passage rate. Data surrounding the use of tamsulosin for medical expulsive therapy is controversial, but meta analyses suggests it is most efficacious for distal stones between 5-56mm in size. Possible side effects include dizziness/lightheadednes -SWL has a lower stone free rate in a single procedure, but also a lower complication rate compared to ureteroscopy and avoids a stent and associated stent related symptoms. Possible complications include renal hematoma, steinstrasse, and need for additional treatment. -Ureteroscopy with laser lithotripsy and stent placement has a higher stone free rate than SWL in a single procedure,  however increased complication rate including possible infection, ureteral injury, bleeding, and stent related morbidity. Common stent related symptoms include dysuria, urgency/frequency, and flank pain.  -patient would like to continue conservative management and return next year for KUB and UA    2. Frequency/urgency -resolved  3. High risk hematuria -work up in 2017 - nephrolithiasis -cysto 2021 - NED -no reports of gross heme -UA negative for micro heme   Return in about 1 year (around 12/04/2021) for KUB and UA .  These notes generated with voice recognition software. I apologize for typographical errors.  Zara Council, PA-C  [facility] [address] [place], [place] [address] [phone]

## 2020-12-04 ENCOUNTER — Ambulatory Visit (INDEPENDENT_AMBULATORY_CARE_PROVIDER_SITE_OTHER): Payer: Medicare Other | Admitting: Urology

## 2020-12-04 ENCOUNTER — Encounter: Payer: Self-pay | Admitting: Urology

## 2020-12-04 ENCOUNTER — Other Ambulatory Visit: Payer: Self-pay

## 2020-12-04 VITALS — BP 120/64 | HR 71 | Ht 64.0 in | Wt 115.0 lb

## 2020-12-04 DIAGNOSIS — N3281 Overactive bladder: Secondary | ICD-10-CM

## 2020-12-04 DIAGNOSIS — R319 Hematuria, unspecified: Secondary | ICD-10-CM

## 2020-12-04 DIAGNOSIS — N200 Calculus of kidney: Secondary | ICD-10-CM

## 2020-12-04 LAB — BLADDER SCAN AMB NON-IMAGING: Scan Result: 0

## 2020-12-05 LAB — MICROSCOPIC EXAMINATION
Bacteria, UA: NONE SEEN
Epithelial Cells (non renal): NONE SEEN /hpf (ref 0–10)

## 2020-12-05 LAB — URINALYSIS, COMPLETE
Bilirubin, UA: NEGATIVE
Glucose, UA: NEGATIVE
Ketones, UA: NEGATIVE
Leukocytes,UA: NEGATIVE
Nitrite, UA: NEGATIVE
Protein,UA: NEGATIVE
Specific Gravity, UA: <1.005 — ABNORMAL LOW (ref 1.005–1.030)
Urobilinogen, Ur: 0.2 mg/dL (ref 0.2–1.0)
pH, UA: 6 (ref 5.0–7.5)

## 2020-12-25 DIAGNOSIS — C44729 Squamous cell carcinoma of skin of left lower limb, including hip: Secondary | ICD-10-CM | POA: Diagnosis not present

## 2021-01-03 ENCOUNTER — Other Ambulatory Visit: Payer: Self-pay | Admitting: Family Medicine

## 2021-01-03 DIAGNOSIS — I10 Essential (primary) hypertension: Secondary | ICD-10-CM

## 2021-01-14 ENCOUNTER — Ambulatory Visit: Payer: Medicare Other | Admitting: Family Medicine

## 2021-01-15 ENCOUNTER — Other Ambulatory Visit: Payer: Self-pay | Admitting: Family Medicine

## 2021-02-12 DIAGNOSIS — E119 Type 2 diabetes mellitus without complications: Secondary | ICD-10-CM | POA: Diagnosis not present

## 2021-02-12 LAB — HM DIABETES EYE EXAM

## 2021-02-13 ENCOUNTER — Other Ambulatory Visit: Payer: Self-pay

## 2021-02-13 ENCOUNTER — Ambulatory Visit (INDEPENDENT_AMBULATORY_CARE_PROVIDER_SITE_OTHER): Payer: Medicare Other | Admitting: Family Medicine

## 2021-02-13 ENCOUNTER — Encounter: Payer: Self-pay | Admitting: Family Medicine

## 2021-02-13 VITALS — BP 130/70 | HR 65 | Temp 98.1°F | Ht 64.0 in | Wt 112.6 lb

## 2021-02-13 DIAGNOSIS — F419 Anxiety disorder, unspecified: Secondary | ICD-10-CM | POA: Diagnosis not present

## 2021-02-13 DIAGNOSIS — E785 Hyperlipidemia, unspecified: Secondary | ICD-10-CM | POA: Diagnosis not present

## 2021-02-13 DIAGNOSIS — L409 Psoriasis, unspecified: Secondary | ICD-10-CM

## 2021-02-13 DIAGNOSIS — Z780 Asymptomatic menopausal state: Secondary | ICD-10-CM

## 2021-02-13 DIAGNOSIS — J309 Allergic rhinitis, unspecified: Secondary | ICD-10-CM | POA: Diagnosis not present

## 2021-02-13 DIAGNOSIS — G609 Hereditary and idiopathic neuropathy, unspecified: Secondary | ICD-10-CM

## 2021-02-13 DIAGNOSIS — R7303 Prediabetes: Secondary | ICD-10-CM

## 2021-02-13 DIAGNOSIS — I10 Essential (primary) hypertension: Secondary | ICD-10-CM

## 2021-02-13 LAB — COMPREHENSIVE METABOLIC PANEL
ALT: 12 U/L (ref 0–35)
AST: 20 U/L (ref 0–37)
Albumin: 4 g/dL (ref 3.5–5.2)
Alkaline Phosphatase: 65 U/L (ref 39–117)
BUN: 11 mg/dL (ref 6–23)
CO2: 31 mEq/L (ref 19–32)
Calcium: 9.4 mg/dL (ref 8.4–10.5)
Chloride: 102 mEq/L (ref 96–112)
Creatinine, Ser: 0.6 mg/dL (ref 0.40–1.20)
GFR: 89.72 mL/min (ref >60.00)
Glucose, Bld: 100 mg/dL — ABNORMAL HIGH (ref 70–99)
Potassium: 3.5 mEq/L (ref 3.5–5.1)
Sodium: 140 mEq/L (ref 135–145)
Total Bilirubin: 1 mg/dL (ref 0.2–1.2)
Total Protein: 6.7 g/dL (ref 6.0–8.3)

## 2021-02-13 LAB — LIPID PANEL
Cholesterol: 157 mg/dL (ref 0–200)
HDL: 79.8 mg/dL (ref >39.00)
LDL Cholesterol: 66 mg/dL (ref 0–99)
NonHDL: 76.9
Total CHOL/HDL Ratio: 2
Triglycerides: 57 mg/dL (ref 0.0–149.0)
VLDL: 11.4 mg/dL (ref 0.0–40.0)

## 2021-02-13 LAB — HEMOGLOBIN A1C: Hgb A1c MFr Bld: 6 % (ref 4.6–6.5)

## 2021-02-13 NOTE — Assessment & Plan Note (Signed)
Check A1c. 

## 2021-02-13 NOTE — Assessment & Plan Note (Signed)
I suspect this is either postviral or allergies.  She will try adding on Claritin.  She will continue Flonase.  If is not improving over the next 1 to 2 weeks she will let me know.

## 2021-02-13 NOTE — Progress Notes (Signed)
[name], MD Phone: [phone]  Molly Gregory is a 70 y.o. female who presents today for f/u.  HYPERTENSION Disease Monitoring Home BP Monitoring 110s-120s/56-60s Chest pain- no    Dyspnea- no Medications Compliance-  taking amlodipine, losartan.   Edema- no BMET    Component Value Date/Time   NA 142 07/12/2020 0835   K 3.6 07/12/2020 0835   CL 103 07/12/2020 0835   CO2 30 07/12/2020 0835   GLUCOSE 112 (H) 07/12/2020 0835   BUN 17 07/12/2020 0835   BUN 16 01/29/2016 1444   CREATININE 0.59 07/12/2020 0835   CALCIUM 9.6 07/12/2020 0835   GFRNONAA >60 07/20/2016 1514   GFRAA >60 07/20/2016 1514   HLD: patient stopped the lipitor about 6 months ago. No RUQ pain or myalgias.   Respiratory illness: Patient had a cough and upper respiratory illness that started 4 weeks ago.  She progressively has improved and notes the cough has resolved.  She does still have some postnasal drip and some nasal congestion.  No sneezing.  She does use Flonase.  Psoriasis: Patient notes her psoriasis seemed to get worse after her respiratory infection.  She has been on [place].  She saw Dr. Evorn Gong back in October and notes they discussed starting on an injectable medication for this.  Anxiety: She does note some anxiety related to keeping her 2 grandchildren though otherwise seems to be doing well.  No depression.  She is on Celexa.  No SI.  Neuropathy: Patient reports this is adequately controlled with a home remedy with peppermint oil and lotion.  She reports having seen neurology and they offered her gabapentin though she has not taken that.  Social History   Tobacco Use  Smoking Status Never  Smokeless Tobacco Never    Current Outpatient Medications on File Prior to Visit  Medication Sig Dispense Refill   amLODipine (NORVASC) 10 MG tablet TAKE 1 TABLET BY MOUTH EVERY DAY 90 tablet 2   ascorbic acid (VITAMIN C) 500 MG tablet Take 500 mg by mouth daily.     Cholecalciferol  (VITAMIN D3) 25 MCG (1000 UT) CAPS Take by mouth.     citalopram (CELEXA) 10 MG tablet TAKE 1 TABLET BY MOUTH EVERY DAY 90 tablet 1   losartan (COZAAR) 25 MG tablet TAKE 1/2 TABLET BY MOUTH EVERY DAY 45 tablet 7   Multiple Vitamins-Minerals (WOMENS MULTIVITAMIN PLUS PO) Take by mouth.     Niacinamide-Zn-Cu-Methfo-Se-Cr (NICOTINAMIDE) 750-27-2-0.5 MG TABS Take 500 mg by mouth 2 (two) times daily.     OTEZLA 30 MG TABS Take 1 tablet by mouth 2 (two) times daily.     prednisoLONE acetate (PRED FORTE) 1 % ophthalmic suspension 1 drop 4 (four) times daily.     psyllium (METAMUCIL) 58.6 % packet Take 1 packet by mouth daily.      triamcinolone cream (KENALOG) 0.1 % Apply 1 application topically 2 (two) times daily.      Zinc 50 MG TABS Take by mouth.     No current facility-administered medications on file prior to visit.     ROS see history of present illness  Objective  Physical Exam Vitals:   02/13/21 1034  BP: 130/70  Pulse: 65  Temp: 98.1 F (36.7 C)  SpO2: 99%    BP Readings from Last 3 Encounters:  02/13/21 130/70  12/04/20 120/64  07/16/20 (!) 128/50   Wt Readings from Last 3 Encounters:  02/13/21 112 lb 9.6 oz (51.1 kg)  12/04/20 115 lb (52.2 kg)  07/16/20 119 lb (54 kg)    Physical Exam Constitutional:      General: She is not in acute distress.    Appearance: She is not diaphoretic.  Cardiovascular:     Rate and Rhythm: Normal rate and regular rhythm.     Heart sounds: Normal heart sounds.  Pulmonary:     Effort: Pulmonary effort is normal.     Breath sounds: Normal breath sounds.  Musculoskeletal:     Right lower leg: No edema.     Left lower leg: No edema.  Skin:    General: Skin is warm and dry.  Neurological:     Mental Status: She is alert.     Assessment/Plan: Please see individual problem list.  Problem List Items Addressed This Visit     Allergic rhinitis    I suspect this is either postviral or allergies.  She will try adding on Claritin.   She will continue Flonase.  If is not improving over the next 1 to 2 weeks she will let me know.      Anxiety    Generally stable.  She will continue Celexa 10 mg daily.      Essential hypertension, benign - Primary    Well-controlled at home.  She will continue amlodipine 10 mg once daily and losartan 12.5 mg daily.      Relevant Orders   Basic Metabolic Panel (BMET)   Hereditary and idiopathic peripheral neuropathy    Generally stable with use of peppermint oil and lotion.  She will continue to monitor.      Hyperlipidemia    We will check her cholesterol today.  She will likely need to go back on her Lipitor.      Relevant Orders   Comp Met (CMET)   Lipid panel   Prediabetes    Check A1c.      Relevant Orders   HgB A1c   HgB A1c   Psoriasis    I encouraged the patient to follow-up with dermatology if her psoriasis continues to be an issue.      Other Visit Diagnoses     Postmenopausal estrogen deficiency       Relevant Orders   DG Bone Density        Health Maintenance: the patient will call to schedule her bone density scan.  Return in about 6 months (around 08/14/2021) for Hypertension, labs 2 days before.  This visit occurred during the SARS-CoV-2 public health emergency.  Safety protocols were in place, including screening questions prior to the visit, additional usage of staff PPE, and extensive cleaning of exam room while observing appropriate contact time as indicated for disinfecting solutions.    [name], MD [place]

## 2021-02-13 NOTE — Assessment & Plan Note (Signed)
Well-controlled at home.  She will continue amlodipine 10 mg once daily and losartan 12.5 mg daily.

## 2021-02-13 NOTE — Assessment & Plan Note (Signed)
We will check her cholesterol today.  She will likely need to go back on her Lipitor.

## 2021-02-13 NOTE — Patient Instructions (Addendum)
Nice to see you. Please call to schedule your DEXA scan.  The number is [phone]. Please try Claritin to see if that helps with your postnasal drip. Please see your  dermatologist if your psoriasis continues to be an issue.

## 2021-02-13 NOTE — Assessment & Plan Note (Signed)
Generally stable.  She will continue Celexa 10 mg daily.

## 2021-02-13 NOTE — Assessment & Plan Note (Signed)
Generally stable with use of peppermint oil and lotion.  She will continue to monitor.

## 2021-02-13 NOTE — Assessment & Plan Note (Signed)
I encouraged the patient to follow-up with dermatology if her psoriasis continues to be an issue.

## 2021-02-26 ENCOUNTER — Encounter: Payer: Self-pay | Admitting: Family Medicine

## 2021-02-26 DIAGNOSIS — R059 Cough, unspecified: Secondary | ICD-10-CM | POA: Diagnosis not present

## 2021-02-26 DIAGNOSIS — B9689 Other specified bacterial agents as the cause of diseases classified elsewhere: Secondary | ICD-10-CM | POA: Diagnosis not present

## 2021-02-26 DIAGNOSIS — J40 Bronchitis, not specified as acute or chronic: Secondary | ICD-10-CM | POA: Diagnosis not present

## 2021-02-26 DIAGNOSIS — Z03818 Encounter for observation for suspected exposure to other biological agents ruled out: Secondary | ICD-10-CM | POA: Diagnosis not present

## 2021-02-26 DIAGNOSIS — J329 Chronic sinusitis, unspecified: Secondary | ICD-10-CM | POA: Diagnosis not present

## 2021-02-26 DIAGNOSIS — R052 Subacute cough: Secondary | ICD-10-CM | POA: Diagnosis not present

## 2021-02-26 NOTE — Telephone Encounter (Signed)
Patient called office and appointment made. Patient states she may try going to urgent care.

## 2021-02-26 NOTE — Telephone Encounter (Signed)
I called and LVM for patient to call back to schedule a virtual visit with any provider available today or [place] tomorrow.  Saleemah Mollenhauer,cma

## 2021-02-27 ENCOUNTER — Telehealth: Payer: Medicare Other | Admitting: Family Medicine

## 2021-03-06 ENCOUNTER — Telehealth: Payer: Self-pay | Admitting: Gastroenterology

## 2021-03-06 ENCOUNTER — Other Ambulatory Visit: Payer: Self-pay

## 2021-03-06 DIAGNOSIS — Z860100 Personal history of colon polyps, unspecified: Secondary | ICD-10-CM

## 2021-03-06 MED ORDER — NA SULFATE-K SULFATE-MG SULF 17.5-3.13-1.6 GM/177ML PO SOLN: 1.0000 | Freq: Once | ORAL | 0 refills | Status: AC

## 2021-03-06 NOTE — Progress Notes (Signed)
Gastroenterology Pre-Procedure Review  Request Date: 04/23/2021 Requesting Physician: Dr. Allen Norris  PATIENT REVIEW QUESTIONS: The patient responded to the following health history questions as indicated:    1. Are you having any GI issues? no 2. Do you have a personal history of Polyps? yes (last colonoscopy) 3. Do you have a family history of Colon Cancer or Polyps? no 4. Diabetes Mellitus? no 5. Joint replacements in the past 12 months?no 6. Major health problems in the past 3 months?no 7. Any artificial heart valves, MVP, or defibrillator?no    MEDICATIONS & ALLERGIES:    Patient reports the following regarding taking any anticoagulation/antiplatelet therapy:   Plavix, Coumadin, Eliquis, Xarelto, Lovenox, Pradaxa, Brilinta, or Effient? no Aspirin? no  Patient confirms/reports the following medications:  Current Outpatient Medications  Medication Sig Dispense Refill   amLODipine (NORVASC) 10 MG tablet TAKE 1 TABLET BY MOUTH EVERY DAY 90 tablet 2   ascorbic acid (VITAMIN C) 500 MG tablet Take 500 mg by mouth daily.     Cholecalciferol (VITAMIN D3) 25 MCG (1000 UT) CAPS Take by mouth.     citalopram (CELEXA) 10 MG tablet TAKE 1 TABLET BY MOUTH EVERY DAY 90 tablet 1   losartan (COZAAR) 25 MG tablet TAKE 1/2 TABLET BY MOUTH EVERY DAY 45 tablet 7   Multiple Vitamins-Minerals (WOMENS MULTIVITAMIN PLUS PO) Take by mouth.     Niacinamide-Zn-Cu-Methfo-Se-Cr (NICOTINAMIDE) 750-27-2-0.5 MG TABS Take 500 mg by mouth 2 (two) times daily.     OTEZLA 30 MG TABS Take 1 tablet by mouth 2 (two) times daily.     prednisoLONE acetate (PRED FORTE) 1 % ophthalmic suspension 1 drop 4 (four) times daily.     psyllium (METAMUCIL) 58.6 % packet Take 1 packet by mouth daily.      triamcinolone cream (KENALOG) 0.1 % Apply 1 application topically 2 (two) times daily.      Zinc 50 MG TABS Take by mouth.     No current facility-administered medications for this visit.    Patient confirms/reports the following  allergies:  Allergies  Allergen Reactions   Penicillins     No orders of the defined types were placed in this encounter.   AUTHORIZATION INFORMATION Primary Insurance: 1D#: Group #:  Secondary Insurance: 1D#: Group #:  SCHEDULE INFORMATION: Date:  04/23/2021 Time: Location: [facility]

## 2021-03-06 NOTE — Telephone Encounter (Signed)
Inbound call from pt requesting a call back to sch her recall colonoscopy. Thank you.

## 2021-04-08 DIAGNOSIS — R208 Other disturbances of skin sensation: Secondary | ICD-10-CM | POA: Diagnosis not present

## 2021-04-08 DIAGNOSIS — C44629 Squamous cell carcinoma of skin of left upper limb, including shoulder: Secondary | ICD-10-CM | POA: Diagnosis not present

## 2021-04-08 DIAGNOSIS — D485 Neoplasm of uncertain behavior of skin: Secondary | ICD-10-CM | POA: Diagnosis not present

## 2021-04-11 ENCOUNTER — Other Ambulatory Visit: Payer: Self-pay

## 2021-04-11 ENCOUNTER — Ambulatory Visit
Admission: RE | Admit: 2021-04-11 | Discharge: 2021-04-11 | Disposition: A | Discharge status: Home / Self Care | Payer: Medicare Other | Source: Ambulatory Visit | Attending: Family Medicine | Admitting: Family Medicine

## 2021-04-11 DIAGNOSIS — Z780 Asymptomatic menopausal state: Secondary | ICD-10-CM | POA: Diagnosis not present

## 2021-04-11 DIAGNOSIS — M810 Age-related osteoporosis without current pathological fracture: Secondary | ICD-10-CM | POA: Diagnosis not present

## 2021-04-15 ENCOUNTER — Telehealth: Payer: Self-pay | Admitting: Gastroenterology

## 2021-04-15 ENCOUNTER — Telehealth: Payer: Self-pay

## 2021-04-15 NOTE — Telephone Encounter (Signed)
Patient called and wanted paperwork remailed so I printed them out and remailed

## 2021-04-15 NOTE — Telephone Encounter (Signed)
Pt has a question about her colonoscopy

## 2021-04-23 ENCOUNTER — Ambulatory Visit: Payer: Medicare Other | Admitting: Anesthesiology

## 2021-04-23 ENCOUNTER — Ambulatory Visit
Admission: RE | Admit: 2021-04-23 | Discharge: 2021-04-23 | Disposition: A | Discharge status: Home / Self Care | Payer: Medicare Other | Source: Ambulatory Visit | Attending: Gastroenterology | Admitting: Gastroenterology

## 2021-04-23 ENCOUNTER — Encounter: Payer: Self-pay | Admitting: Gastroenterology

## 2021-04-23 ENCOUNTER — Encounter
Admission: RE | Disposition: A | Discharge status: Home / Self Care | Payer: Self-pay | Source: Ambulatory Visit | Attending: Gastroenterology

## 2021-04-23 DIAGNOSIS — D125 Benign neoplasm of sigmoid colon: Secondary | ICD-10-CM | POA: Diagnosis not present

## 2021-04-23 DIAGNOSIS — Z860100 Personal history of colon polyps, unspecified: Secondary | ICD-10-CM

## 2021-04-23 DIAGNOSIS — E785 Hyperlipidemia, unspecified: Secondary | ICD-10-CM | POA: Diagnosis not present

## 2021-04-23 DIAGNOSIS — I10 Essential (primary) hypertension: Secondary | ICD-10-CM | POA: Insufficient documentation

## 2021-04-23 DIAGNOSIS — D126 Benign neoplasm of colon, unspecified: Secondary | ICD-10-CM | POA: Diagnosis not present

## 2021-04-23 DIAGNOSIS — D122 Benign neoplasm of ascending colon: Secondary | ICD-10-CM | POA: Diagnosis not present

## 2021-04-23 DIAGNOSIS — Z79899 Other long term (current) drug therapy: Secondary | ICD-10-CM | POA: Diagnosis not present

## 2021-04-23 DIAGNOSIS — K5730 Diverticulosis of large intestine without perforation or abscess without bleeding: Secondary | ICD-10-CM | POA: Insufficient documentation

## 2021-04-23 DIAGNOSIS — K635 Polyp of colon: Secondary | ICD-10-CM | POA: Diagnosis not present

## 2021-04-23 DIAGNOSIS — Z1211 Encounter for screening for malignant neoplasm of colon: Secondary | ICD-10-CM | POA: Diagnosis not present

## 2021-04-23 HISTORY — PX: COLONOSCOPY WITH PROPOFOL: SHX5780

## 2021-04-23 SURGERY — COLONOSCOPY WITH PROPOFOL
Anesthesia: General

## 2021-04-23 MED ORDER — SODIUM CHLORIDE 0.9 % IV SOLN: INTRAVENOUS | Status: DC

## 2021-04-23 MED ORDER — PROPOFOL 10 MG/ML IV BOLUS
INTRAVENOUS | Status: DC | PRN
  Administered 2021-04-23: 40 mg via INTRAVENOUS
  Administered 2021-04-23: 20 mg via INTRAVENOUS

## 2021-04-23 MED ORDER — PROPOFOL 500 MG/50ML IV EMUL
INTRAVENOUS | Status: DC | PRN
  Administered 2021-04-23: 200 ug/kg/min via INTRAVENOUS

## 2021-04-23 NOTE — Transfer of Care (Signed)
Immediate Anesthesia Transfer of Care Note  Patient: Molly Gregory  Procedure(s) Performed: COLONOSCOPY WITH PROPOFOL  Patient Location: PACU  Anesthesia Type:General  Level of Consciousness: awake, alert  and oriented  Airway & Oxygen Therapy: Patient Spontanous Breathing and Patient connected to nasal cannula oxygen  Post-op Assessment: Report given to RN and Post -op Vital signs reviewed and stable  Post vital signs: Reviewed and stable  Last Vitals:  Vitals Value Taken Time  BP    Temp 36.7 C 04/23/21 0831  Pulse 62 04/23/21 0832  Resp 21 04/23/21 0832  SpO2 100 % 04/23/21 0832  Vitals shown include unvalidated device data.  Last Pain:  Vitals:   04/23/21 0831  TempSrc: Temporal  PainSc: 0-No pain         Complications: No notable events documented.

## 2021-04-23 NOTE — Anesthesia Postprocedure Evaluation (Signed)
Anesthesia Post Note  Patient: Molly Gregory  Procedure(s) Performed: COLONOSCOPY WITH PROPOFOL  Patient location during evaluation: Endoscopy Anesthesia Type: General Level of consciousness: awake and alert Pain management: pain level controlled Vital Signs Assessment: post-procedure vital signs reviewed and stable Respiratory status: spontaneous breathing, nonlabored ventilation, respiratory function stable and patient connected to nasal cannula oxygen Cardiovascular status: blood pressure returned to baseline and stable Postop Assessment: no apparent nausea or vomiting Anesthetic complications: no   No notable events documented.   Last Vitals:  Vitals:   04/23/21 0841 04/23/21 0851  BP: 121/78 131/78  Pulse: (!) 59 61  Resp: 14 17  Temp:    SpO2: 100% 100%    Last Pain:  Vitals:   04/23/21 0851  TempSrc:   PainSc: 0-No pain                 Arita Miss

## 2021-04-23 NOTE — Op Note (Signed)
[facility] Gastroenterology Patient Name: Molly Gregory Procedure Date: 04/23/2021 7:59 AM MRN: [id number] Account #: [id number] Date of Birth: 04-03-1950 Admit Type: Outpatient Age: 71 Room: [facility] ENDO ROOM 4 Gender: Female Note Status: Finalized Instrument Name: Jasper Riling [id number] Procedure:             Colonoscopy Indications:           High risk colon cancer surveillance: Personal history                         of colonic polyps Providers:             [name] MD, MD Medicines:             Propofol per Anesthesia Complications:         No immediate complications. Procedure:             Pre-Anesthesia Assessment:                        - Prior to the procedure, a History and Physical was                         performed, and patient medications and allergies were                         reviewed. The patient's tolerance of previous                         anesthesia was also reviewed. The risks and benefits                         of the procedure and the sedation options and risks                         were discussed with the patient. All questions were                         answered, and informed consent was obtained. Prior                         Anticoagulants: The patient has taken no previous                         anticoagulant or antiplatelet agents. ASA Grade                         Assessment: II - A patient with mild systemic disease.                         After reviewing the risks and benefits, the patient                         was deemed in satisfactory condition to undergo the                         procedure.                        After obtaining informed consent, the colonoscope was  passed under direct vision. Throughout the procedure,                         the patient's blood pressure, pulse, and oxygen                         saturations were monitored continuously. The                          Colonoscope was introduced through the anus and                         advanced to the the cecum, identified by appendiceal                         orifice and ileocecal valve. The colonoscopy was                         performed without difficulty. The patient tolerated                         the procedure well. The quality of the bowel                         preparation was excellent. Findings:      The perianal and digital rectal examinations were normal.      A 4 mm polyp was found in the sigmoid colon. The polyp was sessile. The       polyp was removed with a cold snare. Resection and retrieval were       complete.      Two sessile polyps were found in the ascending colon. The polyps were 2       to 3 mm in size. These polyps were removed with a cold snare. Resection       and retrieval were complete.      Multiple small-mouthed diverticula were found in the sigmoid colon. Impression:            - One 4 mm polyp in the sigmoid colon, removed with a                         cold snare. Resected and retrieved.                        - Two 2 to 3 mm polyps in the ascending colon, removed                         with a cold snare. Resected and retrieved.                        - Diverticulosis in the sigmoid colon. Recommendation:        - Discharge patient to home.                        - Resume previous diet.                        - Continue present medications.                        -  Await pathology results.                        - Repeat colonoscopy is not recommended for                         surveillance. Procedure Code(s):     --- Professional ---                        [phone], Colonoscopy, flexible; with removal of                         tumor(s), polyp(s), or other lesion(s) by snare                         technique Diagnosis Code(s):     --- Professional ---                        Z86.010, Personal history of colonic polyps                        K63.5, Polyp of  colon CPT copyright 2019 American Medical Association. All rights reserved. The codes documented in this report are preliminary and upon coder review may  be revised to meet current compliance requirements. [name] MD, MD 04/23/2021 8:32:10 AM This report has been signed electronically. Number of Addenda: 0 Note Initiated On: 04/23/2021 7:59 AM Scope Withdrawal Time: 0 hours 8 minutes 9 seconds  Total Procedure Duration: 0 hours 18 minutes 36 seconds  Estimated Blood Loss:  Estimated blood loss: none.      [facility]

## 2021-04-23 NOTE — Anesthesia Preprocedure Evaluation (Signed)
Anesthesia Evaluation  Patient identified by MRN, date of birth, ID band Patient awake    Reviewed: Allergy & Precautions, NPO status , Patient's Chart, lab work & pertinent test results  History of Anesthesia Complications Negative for: history of anesthetic complications  Airway Mallampati: II  TM Distance: >3 FB Neck ROM: Full    Dental no notable dental hx. (+) Teeth Intact   Pulmonary neg pulmonary ROS, neg sleep apnea, neg COPD, Patient abstained from smoking.Not current smoker,    Pulmonary exam normal breath sounds clear to auscultation       Cardiovascular Exercise Tolerance: Good METShypertension, (-) CAD and (-) Past MI (-) dysrhythmias  Rhythm:Regular Rate:Normal - Systolic murmurs    Neuro/Psych  Headaches, PSYCHIATRIC DISORDERS Anxiety    GI/Hepatic hiatal hernia, neg GERD  ,(+)     (-) substance abuse  ,   Endo/Other  neg diabetes  Renal/GU negative Renal ROS     Musculoskeletal   Abdominal   Peds  Hematology   Anesthesia Other Findings Past Medical History: No date: Allergy No date: Anxiety No date: Arthritis No date: Diverticulosis No date: H/O exercise stress test     Comment:  normal No date: H/O psoriasis 2013: Head injury, closed No date: Hiatal hernia No date: History of chicken pox No date: Hx of acute bronchitis No date: Hx of migraines No date: Hyperlipidemia No date: Hypertension No date: Irregular heart beat  Reproductive/Obstetrics                             Anesthesia Physical Anesthesia Plan  ASA: 2  Anesthesia Plan: General   Post-op Pain Management: Minimal or no pain anticipated   Induction: Intravenous  PONV Risk Score and Plan: 3 and Propofol infusion, TIVA and Ondansetron  Airway Management Planned: Nasal Cannula  Additional Equipment: None  Intra-op Plan:   Post-operative Plan:   Informed Consent: I have reviewed the  patients History and Physical, chart, labs and discussed the procedure including the risks, benefits and alternatives for the proposed anesthesia with the patient or authorized representative who has indicated his/her understanding and acceptance.     Dental advisory given  Plan Discussed with: CRNA and Surgeon  Anesthesia Plan Comments: (Discussed risks of anesthesia with patient, including possibility of difficulty with spontaneous ventilation under anesthesia necessitating airway intervention, PONV, and rare risks such as cardiac or respiratory or neurological events, and allergic reactions. Discussed the role of CRNA in patient's perioperative care. Patient understands.)        Anesthesia Quick Evaluation

## 2021-04-23 NOTE — H&P (Signed)
Molly Lame, MD [facility] [address], [place], [place] [address] [phone] Fax : [phone]  Primary Care Physician:  Leone Haven, MD Primary Gastroenterologist:  Dr. Allen Norris  Pre-Procedure History & Physical: HPI:  Molly Gregory is a 71 y.o. female is here for an colonoscopy.   Past Medical History:  Diagnosis Date   Allergy    Anxiety    Arthritis    Diverticulosis    H/O exercise stress test    normal   H/O psoriasis    Head injury, closed 2013   Hiatal hernia    History of chicken pox    Hx of acute bronchitis    Hx of migraines    Hyperlipidemia    Hypertension    Irregular heart beat     Past Surgical History:  Procedure Laterality Date   CARPAL TUNNEL RELEASE Bilateral    both hands, Dr. Ranae Pila   CATARACT EXTRACTION Left    COLONOSCOPY WITH PROPOFOL N/A 04/24/2020   Procedure: COLONOSCOPY WITH PROPOFOL;  Surgeon: Molly Lame, MD;  Location: [facility] ENDOSCOPY;  Service: Endoscopy;  Laterality: N/A;  COVID POSITIVE ON 04/09/2020 - ASYMPTOMATIC   EYE SURGERY Left    cornea transplant, Dr. Sandra Cockayne   TRIGGER FINGER RELEASE Bilateral    TUBAL LIGATION      Prior to Admission medications   Medication Sig Start Date End Date Taking? Authorizing Provider  amLODipine (NORVASC) 10 MG tablet TAKE 1 TABLET BY MOUTH EVERY DAY 10/08/20  Yes Leone Haven, MD  ascorbic acid (VITAMIN C) 500 MG tablet Take 500 mg by mouth daily.   Yes [provider]  Cholecalciferol (VITAMIN D3) 25 MCG (1000 UT) CAPS Take by mouth.   Yes [provider]  citalopram (CELEXA) 10 MG tablet TAKE 1 TABLET BY MOUTH EVERY DAY 01/15/21  Yes Leone Haven, MD  losartan (COZAAR) 25 MG tablet TAKE 1/2 TABLET BY MOUTH EVERY DAY 01/03/21  Yes Leone Haven, MD  Multiple Vitamins-Minerals (WOMENS MULTIVITAMIN PLUS PO) Take by mouth.   Yes [provider]  OTEZLA 30 MG TABS Take 1 tablet by mouth 2 (two) times daily. 12/23/19  Yes [provider]  Zinc 50 MG TABS Take by mouth.   Yes [provider]  Niacinamide-Zn-Cu-Methfo-Se-Cr (NICOTINAMIDE) 750-27-2-0.5 MG TABS Take 500 mg by mouth 2 (two) times daily.    [provider]  prednisoLONE acetate (PRED FORTE) 1 % ophthalmic suspension 1 drop 4 (four) times daily. 05/01/20   [provider]  psyllium (METAMUCIL) 58.6 % packet Take 1 packet by mouth daily.     [provider]  triamcinolone cream (KENALOG) 0.1 % Apply 1 application topically 2 (two) times daily.     [provider]    Allergies as of 03/06/2021 - Review Complete 03/06/2021  Allergen Reaction Noted   Penicillins  12/16/2012    Family History  Problem Relation Age of Onset   Hypertension Maternal Aunt    Aneurysm Maternal Aunt    Kidney failure Maternal Aunt    Diabetes Maternal Grandmother    Heart disease Maternal Grandmother    Hypertension Maternal Grandmother    Diabetes Paternal Grandmother    Heart disease Paternal Grandmother    Hypertension Paternal Grandmother    Kidney cancer Cousin    Bladder Cancer Neg Hx     Social History   Socioeconomic History   Marital status: Married    Spouse name: Not on file   Number of  children: Not on file   Years of education: Not on file   Highest education level: Not on file  Occupational History   Not on file  Tobacco Use   Smoking status: Never   Smokeless tobacco: Never  Vaping Use   Vaping Use: Never used  Substance and Sexual Activity   Alcohol use: No   Drug use: No   Sexual activity: Not Currently  Other Topics Concern   Not on file  Social History Narrative   Lives with husband in [place]. Cat in home. 1 daughter and 2 sons. 7 grandchildren.      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      Diet - healthy      Exercise - Walking video every day   Social Determinants of Radio broadcast assistant Strain: Not on file  Food Insecurity: Not on file  Transportation Needs: Not on file  Physical  Activity: Not on file  Stress: Not on file  Social Connections: Not on file  Intimate Partner Violence: Not on file    Review of Systems: See HPI, otherwise negative ROS  Physical Exam: BP (!) 134/57    Pulse 63    Temp 97.6 F (36.4 C) (Temporal)    Resp 16    Ht 5\' 4"  (1.626 m)    Wt 49.9 kg    SpO2 100%    BMI 18.88 kg/m  General:   Alert,  pleasant and cooperative in NAD Head:  Normocephalic and atraumatic. Neck:  Supple; no masses or thyromegaly. Lungs:  Clear throughout to auscultation.    Heart:  Regular rate and rhythm. Abdomen:  Soft, nontender and nondistended. Normal bowel sounds, without guarding, and without rebound.   Neurologic:  Alert and  oriented x4;  grossly normal neurologically.  Impression/Plan: Molly Gregory is here for an colonoscopy to be performed for a history of adenomatous polyps on 2022   Risks, benefits, limitations, and alternatives regarding  colonoscopy have been reviewed with the patient.  Questions have been answered.  All parties agreeable.   Molly Lame, MD  04/23/2021, 8:02 AM

## 2021-04-23 NOTE — Anesthesia Procedure Notes (Signed)
Date/Time: 04/23/2021 8:15 AM Performed by: Nelda Marseille, CRNA Pre-anesthesia Checklist: Patient identified, Emergency Drugs available, Suction available, Patient being monitored and Timeout performed Oxygen Delivery Method: Nasal cannula

## 2021-04-24 ENCOUNTER — Encounter: Payer: Self-pay | Admitting: Gastroenterology

## 2021-04-24 LAB — SURGICAL PATHOLOGY

## 2021-04-25 ENCOUNTER — Encounter: Payer: Self-pay | Admitting: Gastroenterology

## 2021-05-01 DIAGNOSIS — C44629 Squamous cell carcinoma of skin of left upper limb, including shoulder: Secondary | ICD-10-CM | POA: Diagnosis not present

## 2021-05-20 DIAGNOSIS — Z1231 Encounter for screening mammogram for malignant neoplasm of breast: Secondary | ICD-10-CM | POA: Diagnosis not present

## 2021-05-20 LAB — HM MAMMOGRAPHY

## 2021-05-27 ENCOUNTER — Encounter: Payer: Self-pay | Admitting: Family Medicine

## 2021-05-27 ENCOUNTER — Ambulatory Visit (INDEPENDENT_AMBULATORY_CARE_PROVIDER_SITE_OTHER): Payer: Medicare Other | Admitting: Family Medicine

## 2021-05-27 ENCOUNTER — Other Ambulatory Visit: Payer: Self-pay

## 2021-05-27 DIAGNOSIS — M810 Age-related osteoporosis without current pathological fracture: Secondary | ICD-10-CM | POA: Diagnosis not present

## 2021-05-27 LAB — BASIC METABOLIC PANEL
BUN: 13 mg/dL (ref 6–23)
CO2: 31 mEq/L (ref 19–32)
Calcium: 10.3 mg/dL (ref 8.4–10.5)
Chloride: 101 mEq/L (ref 96–112)
Creatinine, Ser: 0.65 mg/dL (ref 0.40–1.20)
GFR: 87.84 mL/min (ref >60.00)
Glucose, Bld: 103 mg/dL — ABNORMAL HIGH (ref 70–99)
Potassium: 3.6 mEq/L (ref 3.5–5.1)
Sodium: 140 mEq/L (ref 135–145)

## 2021-05-27 NOTE — Patient Instructions (Signed)
Nice to see you. ?We will let you know what your lab results reveal. ?If your vitamin D and calcium are acceptable we will send Fosamax in for you to start on. ?

## 2021-05-27 NOTE — Assessment & Plan Note (Signed)
Recent diagnosis.  Fairly significant based on her T score.  Discussed treatment options including Forteo, Prolia, and Fosamax as well as zoledronic acid.  I advised that my recommendation would be to start Forteo given where her T score is. ? ?For the Forteo I discussed that this would help build up her bones.  I also discussed the risk of hypercalcemia, nausea, arthralgias, anxiety depression, headaches, rhinitis, pneumonia, and osteosarcoma.  Discussed osteosarcoma seems to be seen in animal studies and has had case reports in humans using this medication though observational studies have not revealed a leak based on review of up-to-date. ? ?For Prolia I discussed that this is a every 54-monthinjection.  Discussed potential risk of arthralgias and osteonecrosis of the jaw. ? ?For Fosamax I discussed that this is antiresorptive therapy.  I discussed the risk of arthralgias.  Discussed that there seems to be less of the risk for osteonecrosis of the jaw with this.  She noted no personal history of esophageal ulcers or stomach ulcers.  Discussed that she would have to remain upright for 1 hour after taking this medication and has to take it on an empty stomach 1 hour before taking anything else.  Discussed she would need to take a full glass of water with this. ? ?The patient opted for Fosamax.  We will check her vitamin D and calcium levels and if they are acceptable we will send Fosamax to the pharmacy.  Plan for repeat bone density in 1 to 2 years.  Discussed continuing with weightbearing exercises. ?

## 2021-05-27 NOTE — Progress Notes (Signed)
?[name], MD ?Phone: [phone] ? ?Molly Gregory is a 71 y.o. female who presents today for follow-up. ? ?Osteoporosis: Patient had a recent bone density scan that revealed a T score of -3.7.  No recent fractures.  She reports taking calcium.  She reports taking vitamin D 1000 international units once daily.  She reports a strong family history of osteoporosis. ? ?Social History  ? ?Tobacco Use  ?Smoking Status Never  ?Smokeless Tobacco Never  ? ? ?Current Outpatient Medications on File Prior to Visit  ?Medication Sig Dispense Refill  ? albuterol (VENTOLIN HFA) 108 (90 Base) MCG/ACT inhaler SMARTSIG:2 Inhalation Via Inhaler Every 6 Hours PRN    ? amLODipine (NORVASC) 10 MG tablet TAKE 1 TABLET BY MOUTH EVERY DAY 90 tablet 2  ? ascorbic acid (VITAMIN C) 500 MG tablet Take 500 mg by mouth daily.    ? benzonatate (TESSALON) 200 MG capsule Take 200 mg by mouth 3 (three) times daily as needed.    ? Cholecalciferol (VITAMIN D3) 25 MCG (1000 UT) CAPS Take by mouth.    ? citalopram (CELEXA) 10 MG tablet TAKE 1 TABLET BY MOUTH EVERY DAY 90 tablet 1  ? losartan (COZAAR) 25 MG tablet TAKE 1/2 TABLET BY MOUTH EVERY DAY 45 tablet 7  ? Multiple Vitamins-Minerals (WOMENS MULTIVITAMIN PLUS PO) Take by mouth.    ? Niacinamide-Zn-Cu-Methfo-Se-Cr (NICOTINAMIDE) 750-27-2-0.5 MG TABS Take 500 mg by mouth 2 (two) times daily.    ? OTEZLA 30 MG TABS Take 1 tablet by mouth 2 (two) times daily.    ? prednisoLONE acetate (PRED FORTE) 1 % ophthalmic suspension 1 drop 4 (four) times daily.    ? psyllium (METAMUCIL) 58.6 % packet Take 1 packet by mouth daily.     ? triamcinolone cream (KENALOG) 0.1 % Apply 1 application topically 2 (two) times daily.     ? Zinc 50 MG TABS Take by mouth.    ? ?No current facility-administered medications on file prior to visit.  ? ? ? ?ROS see history of present illness ? ?Objective ? ?Physical Exam ?Vitals:  ? 05/27/21 1009  ?BP: (!) 140/50  ?Pulse: 79  ?Temp: 98.7 ?F (37.1 ?C)  ?SpO2: 98%   ? ? ?BP Readings from Last 3 Encounters:  ?05/27/21 (!) 140/50  ?04/23/21 131/78  ?02/13/21 130/70  ? ?Wt Readings from Last 3 Encounters:  ?05/27/21 118 lb 6.4 oz (53.7 kg)  ?04/23/21 110 lb (49.9 kg)  ?02/13/21 112 lb 9.6 oz (51.1 kg)  ? ? ?Physical Exam ?Constitutional:   ?   General: She is not in acute distress. ?Neurological:  ?   Mental Status: She is alert.  ? ? ? ?Assessment/Plan: Please see individual problem list. ? ?Problem List Items Addressed This Visit   ? ? Osteoporosis  ?  Recent diagnosis.  Fairly significant based on her T score.  Discussed treatment options including Forteo, Prolia, and Fosamax as well as zoledronic acid.  I advised that my recommendation would be to start Forteo given where her T score is. ? ?For the Forteo I discussed that this would help build up her bones.  I also discussed the risk of hypercalcemia, nausea, arthralgias, anxiety depression, headaches, rhinitis, pneumonia, and osteosarcoma.  Discussed osteosarcoma seems to be seen in animal studies and has had case reports in humans using this medication though observational studies have not revealed a leak based on review of up-to-date. ? ?For Prolia I discussed that this is a every 52-monthinjection.  Discussed potential risk  of arthralgias and osteonecrosis of the jaw. ? ?For Fosamax I discussed that this is antiresorptive therapy.  I discussed the risk of arthralgias.  Discussed that there seems to be less of the risk for osteonecrosis of the jaw with this.  She noted no personal history of esophageal ulcers or stomach ulcers.  Discussed that she would have to remain upright for 1 hour after taking this medication and has to take it on an empty stomach 1 hour before taking anything else.  Discussed she would need to take a full glass of water with this. ? ?The patient opted for Fosamax.  We will check her vitamin D and calcium levels and if they are acceptable we will send Fosamax to the pharmacy.  Plan for repeat bone  density in 1 to 2 years.  Discussed continuing with weightbearing exercises. ?  ?  ? Relevant Orders  ? Vitamin D (25 hydroxy)  ? Basic Metabolic Panel (BMET)  ? ? ?Return in about 3 months (around 08/27/2021) for Follow-up on Fosamax. ? ?This visit occurred during the SARS-CoV-2 public health emergency.  Safety protocols were in place, including screening questions prior to the visit, additional usage of staff PPE, and extensive cleaning of exam room while observing appropriate contact time as indicated for disinfecting solutions.  ? ?I have spent 31 minutes in the care of this patient regarding history taking, documentation, counseling on medications as listed above, placing orders, review of recent DEXA scan. ? ? ?[name], MD ?[place] ? ?

## 2021-05-28 ENCOUNTER — Encounter: Payer: Self-pay | Admitting: Family Medicine

## 2021-05-28 ENCOUNTER — Other Ambulatory Visit: Payer: Self-pay | Admitting: Family Medicine

## 2021-05-28 DIAGNOSIS — M810 Age-related osteoporosis without current pathological fracture: Secondary | ICD-10-CM

## 2021-05-28 LAB — VITAMIN D 25 HYDROXY (VIT D DEFICIENCY, FRACTURES): VITD: 25.26 ng/mL — ABNORMAL LOW (ref 30.00–100.00)

## 2021-05-28 MED ORDER — ALENDRONATE SODIUM 70 MG PO TABS: 70.0000 mg | ORAL_TABLET | ORAL | 11 refills | Status: DC

## 2021-05-29 ENCOUNTER — Other Ambulatory Visit: Payer: Self-pay | Admitting: *Deleted

## 2021-05-29 ENCOUNTER — Telehealth: Payer: Self-pay | Admitting: Family Medicine

## 2021-05-29 DIAGNOSIS — E559 Vitamin D deficiency, unspecified: Secondary | ICD-10-CM

## 2021-05-29 NOTE — Telephone Encounter (Signed)
Pt would like to be called regarding vitamin D ?

## 2021-05-30 NOTE — Telephone Encounter (Signed)
See mychart message on this with advice given by me.  ?

## 2021-05-30 NOTE — Telephone Encounter (Signed)
I called and spoke with the patient and she sent a message stating that she takes a vitamin with Potassium, calcium and vitamin d and  she takes it twice per day I informed her that she needs to add the vitamin d that is in her multivitamin and then buy a OVC vitamin d to get to that 2000 units and she understood. I informed her to make sure that she does not take more of the multivitamin than needed because it may increase the calcium and potassium to much, to concentrate on getting the 2000 units of vitamin  per day and she understood.   Ranen Doolin,cma  ?

## 2021-06-22 ENCOUNTER — Other Ambulatory Visit: Payer: Self-pay | Admitting: Family Medicine

## 2021-06-25 NOTE — Telephone Encounter (Signed)
error 

## 2021-07-06 ENCOUNTER — Other Ambulatory Visit: Payer: Self-pay | Admitting: Family Medicine

## 2021-08-14 ENCOUNTER — Other Ambulatory Visit: Payer: Medicare Other

## 2021-08-16 ENCOUNTER — Ambulatory Visit: Payer: Medicare Other | Admitting: Family Medicine

## 2021-08-20 DIAGNOSIS — Z961 Presence of intraocular lens: Secondary | ICD-10-CM | POA: Diagnosis not present

## 2021-08-20 DIAGNOSIS — E119 Type 2 diabetes mellitus without complications: Secondary | ICD-10-CM | POA: Diagnosis not present

## 2021-08-20 DIAGNOSIS — Z947 Corneal transplant status: Secondary | ICD-10-CM | POA: Diagnosis not present

## 2021-09-05 DIAGNOSIS — Z85828 Personal history of other malignant neoplasm of skin: Secondary | ICD-10-CM | POA: Diagnosis not present

## 2021-09-05 DIAGNOSIS — D2271 Melanocytic nevi of right lower limb, including hip: Secondary | ICD-10-CM | POA: Diagnosis not present

## 2021-09-05 DIAGNOSIS — D2262 Melanocytic nevi of left upper limb, including shoulder: Secondary | ICD-10-CM | POA: Diagnosis not present

## 2021-09-05 DIAGNOSIS — L570 Actinic keratosis: Secondary | ICD-10-CM | POA: Diagnosis not present

## 2021-09-05 DIAGNOSIS — X32XXXA Exposure to sunlight, initial encounter: Secondary | ICD-10-CM | POA: Diagnosis not present

## 2021-09-05 DIAGNOSIS — L400 Psoriasis vulgaris: Secondary | ICD-10-CM | POA: Diagnosis not present

## 2021-09-05 DIAGNOSIS — D2261 Melanocytic nevi of right upper limb, including shoulder: Secondary | ICD-10-CM | POA: Diagnosis not present

## 2021-09-09 ENCOUNTER — Other Ambulatory Visit (INDEPENDENT_AMBULATORY_CARE_PROVIDER_SITE_OTHER): Payer: Medicare Other

## 2021-09-09 DIAGNOSIS — R7303 Prediabetes: Secondary | ICD-10-CM | POA: Diagnosis not present

## 2021-09-09 DIAGNOSIS — E559 Vitamin D deficiency, unspecified: Secondary | ICD-10-CM

## 2021-09-09 DIAGNOSIS — I10 Essential (primary) hypertension: Secondary | ICD-10-CM

## 2021-09-09 LAB — BASIC METABOLIC PANEL
BUN: 13 mg/dL (ref 6–23)
CO2: 27 mEq/L (ref 19–32)
Calcium: 9.4 mg/dL (ref 8.4–10.5)
Chloride: 105 mEq/L (ref 96–112)
Creatinine, Ser: 0.77 mg/dL (ref 0.40–1.20)
GFR: 76.8 mL/min (ref >60.00)
Glucose, Bld: 139 mg/dL — ABNORMAL HIGH (ref 70–99)
Potassium: 3.7 mEq/L (ref 3.5–5.1)
Sodium: 139 mEq/L (ref 135–145)

## 2021-09-09 LAB — HEMOGLOBIN A1C: Hgb A1c MFr Bld: 6.1 % (ref 4.6–6.5)

## 2021-09-09 LAB — VITAMIN D 25 HYDROXY (VIT D DEFICIENCY, FRACTURES): VITD: 37.06 ng/mL (ref 30.00–100.00)

## 2021-09-11 ENCOUNTER — Ambulatory Visit (INDEPENDENT_AMBULATORY_CARE_PROVIDER_SITE_OTHER): Payer: Medicare Other | Admitting: Family Medicine

## 2021-09-11 ENCOUNTER — Encounter: Payer: Self-pay | Admitting: Family Medicine

## 2021-09-11 DIAGNOSIS — T148XXA Other injury of unspecified body region, initial encounter: Secondary | ICD-10-CM

## 2021-09-11 DIAGNOSIS — R7303 Prediabetes: Secondary | ICD-10-CM

## 2021-09-11 DIAGNOSIS — M810 Age-related osteoporosis without current pathological fracture: Secondary | ICD-10-CM | POA: Diagnosis not present

## 2021-09-11 DIAGNOSIS — I10 Essential (primary) hypertension: Secondary | ICD-10-CM | POA: Diagnosis not present

## 2021-09-11 NOTE — Assessment & Plan Note (Signed)
Patient declines prescription medication for this.  She understands risk of weakening bones and risk of fracture without appropriate treatment for this issue.  She will continue calcium and vitamin D supplementation.

## 2021-09-11 NOTE — Assessment & Plan Note (Signed)
Related to thinning of her skin and bumping into things.  She will monitor for widespread bruising.

## 2021-09-11 NOTE — Assessment & Plan Note (Signed)
Well-controlled.  She will take amlodipine 10 mg once daily and losartan 12.5 mg daily.  Labs reviewed.

## 2021-09-11 NOTE — Progress Notes (Signed)
[name], MD Phone: [phone]  Molly Gregory is a 71 y.o. female who presents today for f/u.  HYPERTENSION Disease Monitoring Home BP Monitoring 112-130/52/67 Chest pain- no    Dyspnea- no Medications Compliance-  taking amlodipine, losartan. Lightheadedness-  none recently  Edema- no BMET    Component Value Date/Time   NA 139 09/09/2021 0758   K 3.7 09/09/2021 0758   CL 105 09/09/2021 0758   CO2 27 09/09/2021 0758   GLUCOSE 139 (H) 09/09/2021 0758   BUN 13 09/09/2021 0758   BUN 16 01/29/2016 1444   CREATININE 0.77 09/09/2021 0758   CALCIUM 9.4 09/09/2021 0758   GFRNONAA >60 07/20/2016 1514   GFRAA >60 07/20/2016 1514   Osteoporosis: Patient did not start on the Fosamax.  She did not like the potential side effects.  She has been taking calcium and vitamin D.  No fractures.  Prediabetes: A1c was 6.1.  She does aerobic exercise 1 hour daily.  She is eating a plant-based diet with meats on the weekends.  She has been eating some sweets.  Bruising: Patient reports bruising only over her arms and legs when she bumps into something.  She notes she discussed this with her dermatologist and they advised her it was related to thinning of her skin.  Social History   Tobacco Use  Smoking Status Never  Smokeless Tobacco Never    Current Outpatient Medications on File Prior to Visit  Medication Sig Dispense Refill   albuterol (VENTOLIN HFA) 108 (90 Base) MCG/ACT inhaler SMARTSIG:2 Inhalation Via Inhaler Every 6 Hours PRN     amLODipine (NORVASC) 10 MG tablet TAKE 1 TABLET BY MOUTH EVERY DAY 90 tablet 2   ascorbic acid (VITAMIN C) 500 MG tablet Take 500 mg by mouth daily.     Cholecalciferol (VITAMIN D3) 25 MCG (1000 UT) CAPS Take by mouth.     citalopram (CELEXA) 10 MG tablet TAKE 1 TABLET BY MOUTH EVERY DAY 90 tablet 1   losartan (COZAAR) 25 MG tablet TAKE 1/2 TABLET BY MOUTH EVERY DAY 45 tablet 7   Multiple Vitamins-Minerals (WOMENS MULTIVITAMIN PLUS PO) Take by  mouth.     Niacinamide-Zn-Cu-Methfo-Se-Cr (NICOTINAMIDE) 750-27-2-0.5 MG TABS Take 500 mg by mouth 2 (two) times daily.     OTEZLA 30 MG TABS Take 1 tablet by mouth 2 (two) times daily.     prednisoLONE acetate (PRED FORTE) 1 % ophthalmic suspension 1 drop 4 (four) times daily.     psyllium (METAMUCIL) 58.6 % packet Take 1 packet by mouth daily.      triamcinolone cream (KENALOG) 0.1 % Apply 1 application topically 2 (two) times daily.      No current facility-administered medications on file prior to visit.     ROS see history of present illness  Objective  Physical Exam Vitals:   09/11/21 0833  BP: 130/78  Pulse: 62  Temp: 98.4 F (36.9 C)  SpO2: 96%    BP Readings from Last 3 Encounters:  09/11/21 130/78  05/27/21 (!) 140/50  04/23/21 131/78   Wt Readings from Last 3 Encounters:  09/11/21 118 lb 3.2 oz (53.6 kg)  05/27/21 118 lb 6.4 oz (53.7 kg)  04/23/21 110 lb (49.9 kg)    Physical Exam Constitutional:      General: She is not in acute distress.    Appearance: She is not diaphoretic.  Cardiovascular:     Rate and Rhythm: Normal rate and regular rhythm.     Heart sounds: Normal heart  sounds.  Pulmonary:     Effort: Pulmonary effort is normal.     Breath sounds: Normal breath sounds.  Skin:    General: Skin is warm and dry.     Comments: Small scattered bruises on her bilateral forearms and her right lower leg  Neurological:     Mental Status: She is alert.      Assessment/Plan: Please see individual problem list.  Problem List Items Addressed This Visit     Essential hypertension, benign (Chronic)    Well-controlled.  She will take amlodipine 10 mg once daily and losartan 12.5 mg daily.  Labs reviewed.      Osteoporosis (Chronic)    Patient declines prescription medication for this.  She understands risk of weakening bones and risk of fracture without appropriate treatment for this issue.  She will continue calcium and vitamin D supplementation.       Prediabetes (Chronic)    She will continue diet and exercise.  Discussed limiting sweets.      Bruising    Related to thinning of her skin and bumping into things.  She will monitor for widespread bruising.       Return in about 6 months (around 03/14/2022).   [name], MD [place]

## 2021-09-11 NOTE — Patient Instructions (Signed)
Nice to see you. Please try to limit the sweets and continue with good exercise habits.

## 2021-09-11 NOTE — Assessment & Plan Note (Signed)
She will continue diet and exercise.  Discussed limiting sweets.

## 2021-11-06 IMAGING — CR DG ABDOMEN 1V
1 series · 2 of 2 positions shown · non-contrast
Comparison: Abdominal radiographs 02/03/2018

CLINICAL DATA: Flank pain.

EXAM:
ABDOMEN - 1 VIEW

[Series 1: t abdomen supine · 0.14mm/px · 2 of 2 slices shown]
[im 1/2]
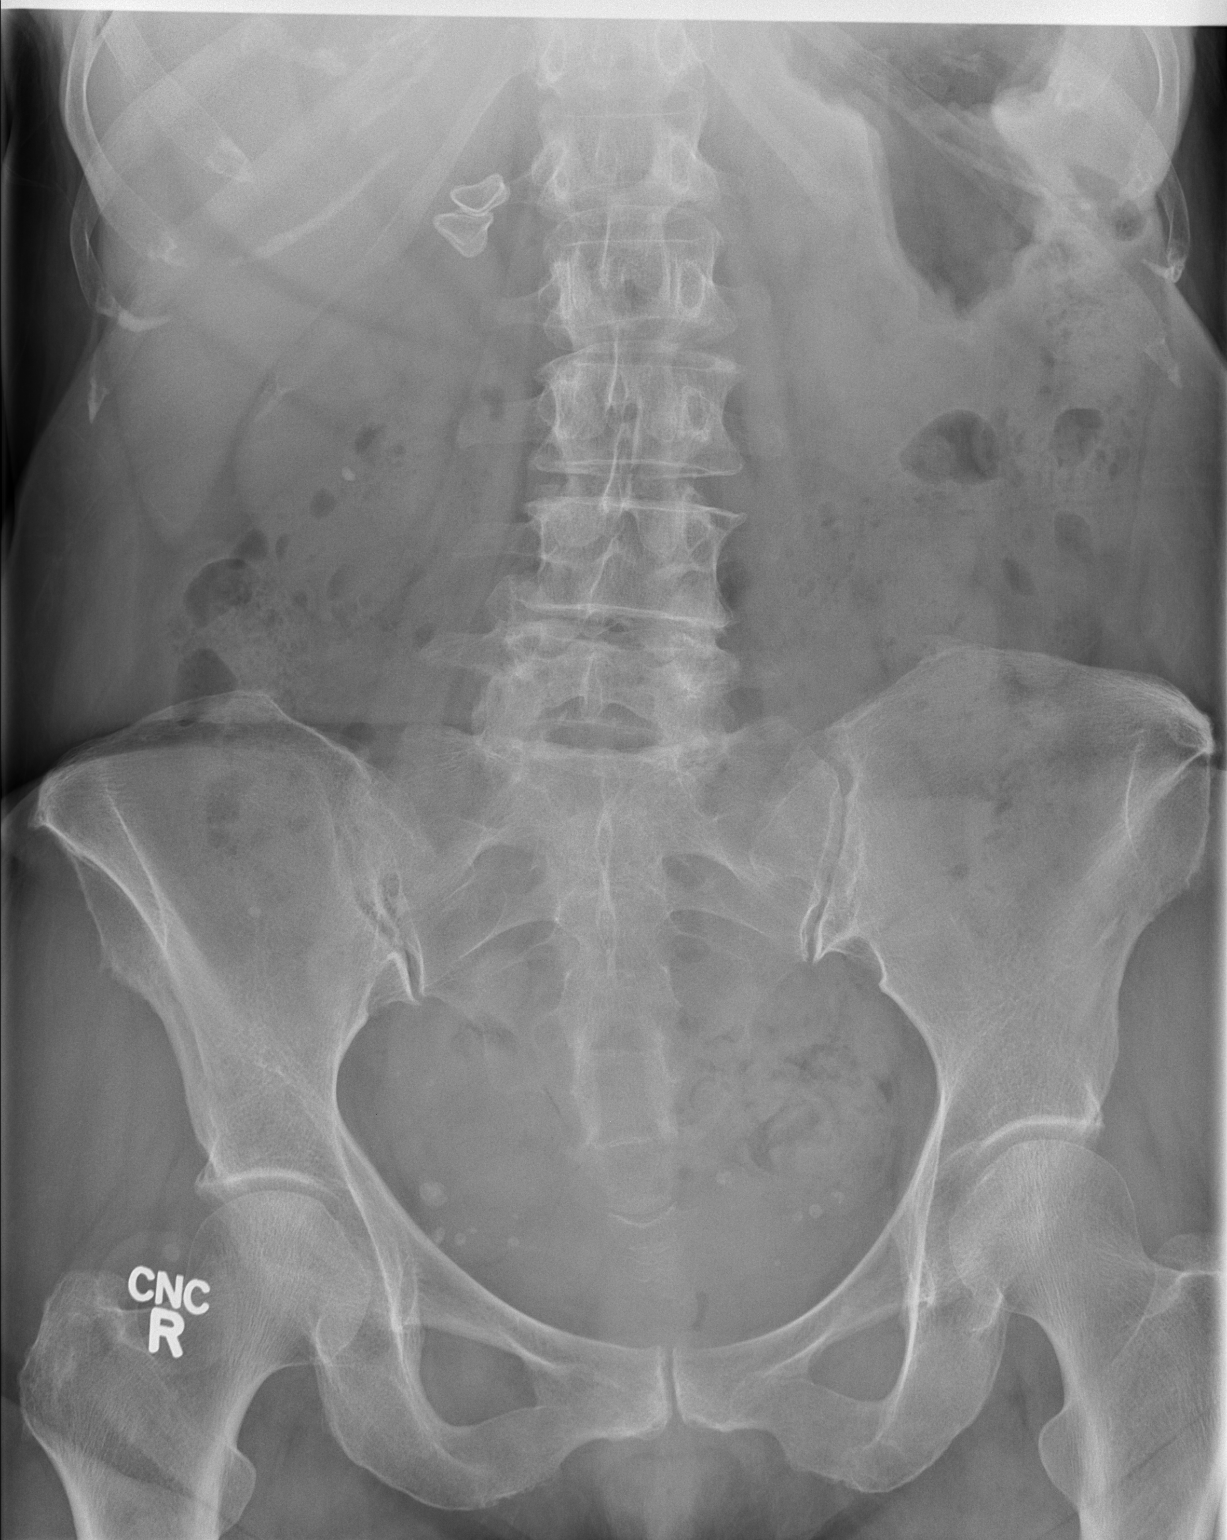
[im 2/2]
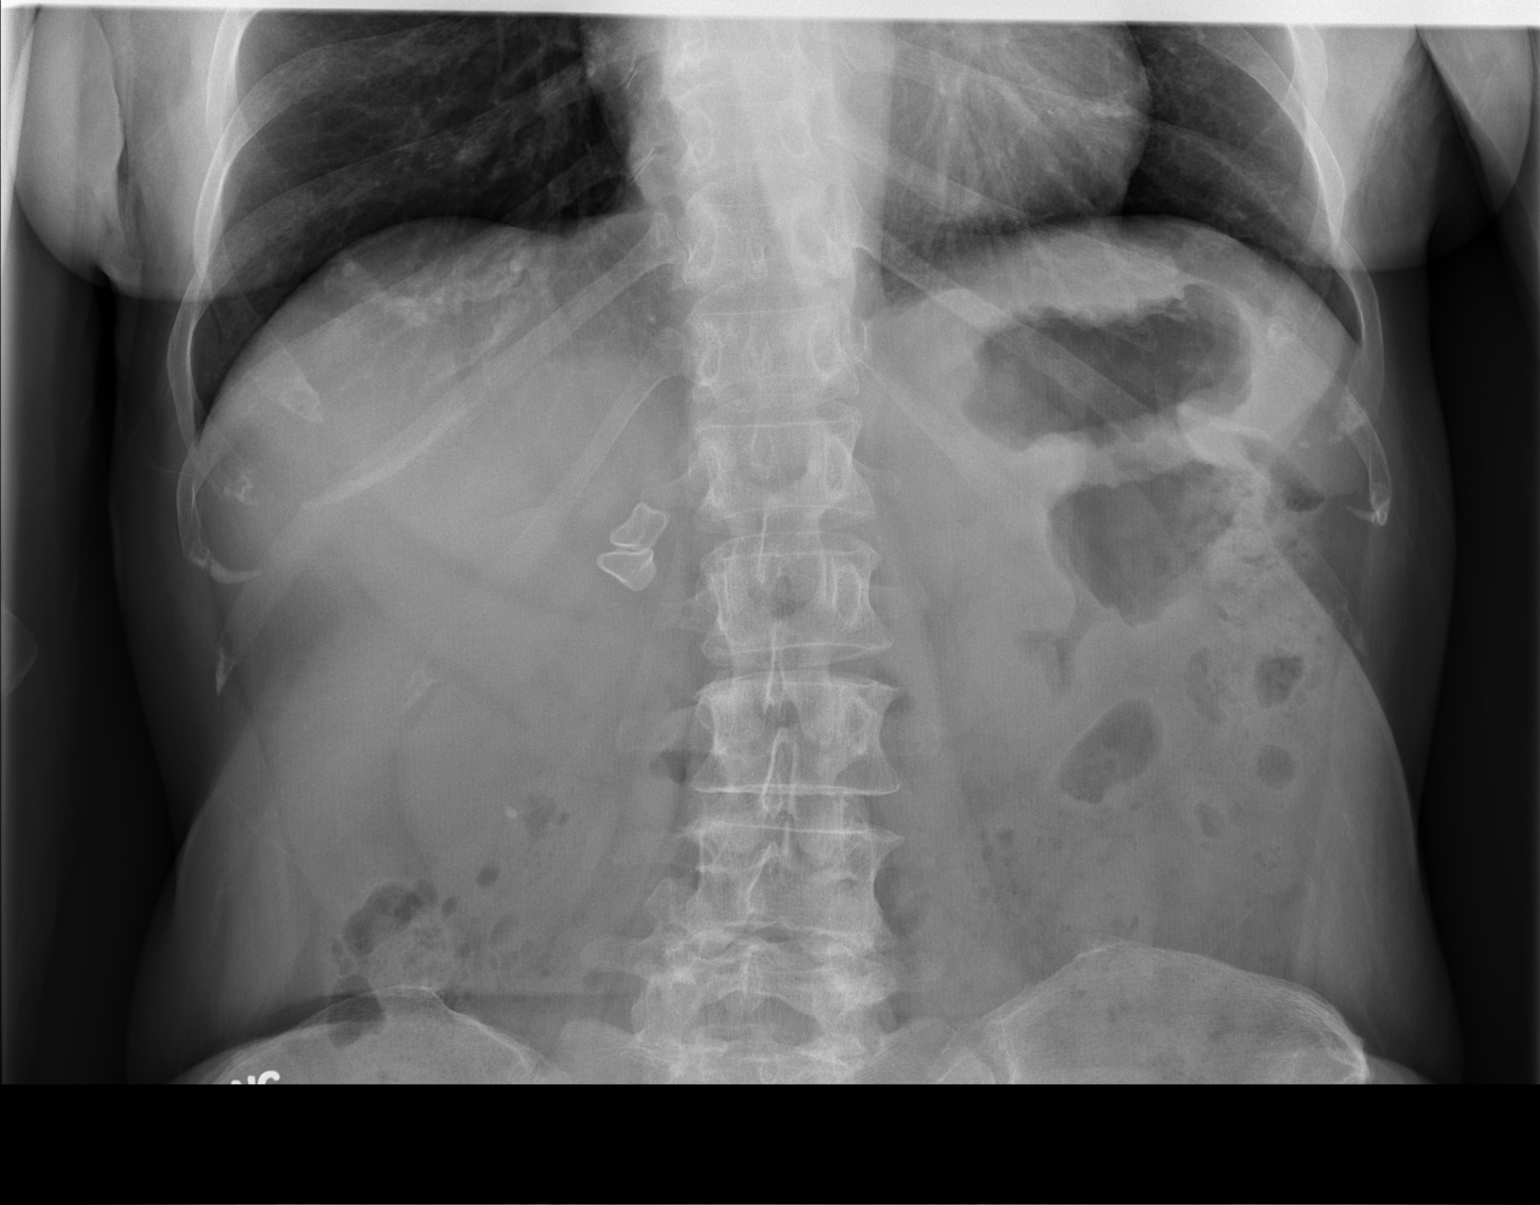

[2 of 2 positions shown; findings below may reference images not displayed]

FINDINGS: No dilated loops of bowel are demonstrated to suggest bowel
obstruction.

Redemonstrated cholecystolithiasis.

Unchanged 4 mm lower pole right renal calculus. No definite left
renal calculus.

Multiple small densities also project over the bilateral pelvis,
likely reflecting phleboliths.

No acute bony abnormality. Degenerative change of the lower lumbar
spine with lumbar levocurvature.
IMPRESSION: Unchanged 4 mm lower pole right renal calculus.

Cholecystolithiasis.

## 2021-11-18 ENCOUNTER — Telehealth: Payer: Self-pay

## 2021-11-18 NOTE — Telephone Encounter (Signed)
Patient states she has an appointment scheduled with Dr. [name] on 03/17/2022, and she would like to know if he would like for her to have labs drawn in preparation for her appointment.  I do not see lab orders in the system at this time, so I let patient know that I will check with Dr. Caryl Bis.

## 2021-11-19 ENCOUNTER — Other Ambulatory Visit: Payer: Self-pay

## 2021-11-19 DIAGNOSIS — E785 Hyperlipidemia, unspecified: Secondary | ICD-10-CM

## 2021-11-19 DIAGNOSIS — R7303 Prediabetes: Secondary | ICD-10-CM

## 2021-11-19 DIAGNOSIS — E559 Vitamin D deficiency, unspecified: Secondary | ICD-10-CM

## 2021-11-19 DIAGNOSIS — I10 Essential (primary) hypertension: Secondary | ICD-10-CM

## 2021-11-19 NOTE — Addendum Note (Signed)
Addended by: Leone Haven on: 11/19/2021 10:49 AM   Modules accepted: Orders

## 2021-11-19 NOTE — Telephone Encounter (Signed)
I scheduled and ordered the labs for the patient and called her and she accepted the date and time of her labs.  Anisten Tomassi,cma

## 2021-11-22 ENCOUNTER — Telehealth: Payer: Self-pay | Admitting: *Deleted

## 2021-11-22 NOTE — Patient Outreach (Signed)
  Care Coordination   11/22/2021 Name: Molly Gregory MRN: [id number] DOB: 1950-06-23   Care Coordination Outreach Attempts:  An unsuccessful telephone outreach was attempted today to offer the patient information about available care coordination services as a benefit of their health plan.   Follow Up Plan:  Additional outreach attempts will be made to offer the patient care coordination information and services.   Encounter Outcome:  No Answer  Care Coordination Interventions Activated:  Yes   Care Coordination Interventions:  No, not indicated    [facility] [phone]

## 2021-11-29 ENCOUNTER — Ambulatory Visit
Admission: RE | Admit: 2021-11-29 | Discharge: 2021-11-29 | Disposition: A | Discharge status: Home / Self Care | Payer: Medicare Other | Source: Ambulatory Visit | Attending: Urology | Admitting: Urology

## 2021-11-29 DIAGNOSIS — N200 Calculus of kidney: Secondary | ICD-10-CM

## 2021-11-29 DIAGNOSIS — K8020 Calculus of gallbladder without cholecystitis without obstruction: Secondary | ICD-10-CM | POA: Diagnosis not present

## 2021-11-29 DIAGNOSIS — I878 Other specified disorders of veins: Secondary | ICD-10-CM | POA: Diagnosis not present

## 2021-12-02 NOTE — Progress Notes (Signed)
03/16/2018  8:59 AM   [name] 08-14-1950 [id number]  Referring provider: Leone Haven, MD [address] [place],  [name] [address]  Urological history: 1. High risk hematuria -non-smoker -CTU 2017 bilateral nephrolithiasis -cysto 2017 NED -non-contrast CT 2021 - right renal stone -cysto 2021 - NED -no reports of gross heme -UA negative for micro heme  2. Right renal stone -non-contrast CT 2021 -right renal stone -KUB (11/2021) 5 mm right renal stone  Chief Complaint  Patient presents with   Hematuria   Nephrolithiasis   Follow-up     HPI: Molly Gregory is a 71 y.o. female who presents today for a yearly follow up.  On her OAB questionnaire, she indicates that she has 1-7 daytime urinations, 1-2 nocturia and a mild urge to urinate.  She does not have any issues with urinary leakage.  She engages in toilet mapping on occasions.  Patient denies any modifying or aggravating factors.  Patient denies any gross hematuria, dysuria or suprapubic/flank pain.  Patient denies any fevers, chills, nausea or vomiting.    UA negative  KUB (11/29/2021) - the 5 mm right renal stone appears to have migrated into the right lower pole   PMH: Past Medical History:  Diagnosis Date   Allergy    Anxiety    Arthritis    Diverticulosis    H/O exercise stress test    normal   H/O psoriasis    Head injury, closed 2013   Hiatal hernia    History of chicken pox    Hx of acute bronchitis    Hx of migraines    Hyperlipidemia    Hypertension    Irregular heart beat     Surgical History: Past Surgical History:  Procedure Laterality Date   CARPAL TUNNEL RELEASE Bilateral    both hands, Dr. Ranae Pila   CATARACT EXTRACTION Left    COLONOSCOPY WITH PROPOFOL N/A 04/24/2020   Procedure: COLONOSCOPY WITH PROPOFOL;  Surgeon: [name], MD;  Location: [facility] ENDOSCOPY;  Service: Endoscopy;  Laterality: N/A;  COVID POSITIVE ON 04/09/2020 - ASYMPTOMATIC    COLONOSCOPY WITH PROPOFOL N/A 04/23/2021   Procedure: COLONOSCOPY WITH PROPOFOL;  Surgeon: [name], MD;  Location: [facility] ENDOSCOPY;  Service: Endoscopy;  Laterality: N/A;   EYE SURGERY Left    cornea transplant, Dr. Sandra Cockayne   TRIGGER FINGER RELEASE Bilateral    [place] Medications:  Allergies as of 12/03/2021       Reactions   Penicillins         Medication List        Accurate as of December 03, 2021  8:59 AM. If you have any questions, ask your nurse or doctor.          albuterol 108 (90 Base) MCG/ACT inhaler Commonly known as: VENTOLIN HFA SMARTSIG:2 Inhalation Via Inhaler Every 6 Hours PRN   amLODipine 10 MG tablet Commonly known as: NORVASC TAKE 1 TABLET BY MOUTH EVERY DAY   ascorbic acid 500 MG tablet Commonly known as: VITAMIN C Take 500 mg by mouth daily.   CALCIUM 600 PO Take by mouth daily.   citalopram 10 MG tablet Commonly known as: CELEXA TAKE 1 TABLET BY MOUTH EVERY DAY   losartan 25 MG tablet Commonly known as: COZAAR TAKE 1/2 TABLET BY MOUTH EVERY DAY   Nicotinamide 750-27-2-0.5 MG Tabs Take 500 mg by mouth 2 (two) times daily.   Otezla 30 MG Tabs Generic drug: Apremilast  Take 1 tablet by mouth 2 (two) times daily.   prednisoLONE acetate 1 % ophthalmic suspension Commonly known as: PRED FORTE 1 drop 4 (four) times daily.   psyllium 58.6 % packet Commonly known as: METAMUCIL Take 1 packet by mouth daily.   triamcinolone cream 0.1 % Commonly known as: KENALOG Apply 1 application topically 2 (two) times daily.   Vitamin D3 25 MCG (1000 UT) Caps Take by mouth.   WOMENS MULTIVITAMIN PLUS PO Take by mouth.        Allergies:  Allergies  Allergen Reactions   Penicillins     Family History: Family History  Problem Relation Age of Onset   Hypertension Maternal Aunt    Aneurysm Maternal Aunt    Kidney failure Maternal Aunt    Diabetes Maternal Grandmother    Heart disease Maternal Grandmother     Hypertension Maternal Grandmother    Diabetes Paternal Grandmother    Heart disease Paternal Grandmother    Hypertension Paternal Grandmother    Kidney cancer Cousin    Bladder Cancer Neg Hx     Social History:  reports that she has never smoked. She has never used smokeless tobacco. She reports that she does not drink alcohol and does not use drugs.  ROS: For pertinent review of systems please refer to history of present illness  Physical Exam: BP 137/65   Pulse 68   Ht '5\' 4"'$  (1.626 m)   Wt 118 lb (53.5 kg)   BMI 20.25 kg/m   Constitutional:  Well nourished. Alert and oriented, No acute distress. HEENT: [place] AT, moist mucus membranes.  Trachea midline Cardiovascular: No clubbing, cyanosis, or edema. Respiratory: Normal respiratory effort, no increased work of breathing. Neurologic: Grossly intact, no focal deficits, moving all 4 extremities. Psychiatric: Normal mood and affect.    Laboratory Data:    Latest Ref Rng & Units 09/09/2021    7:58 AM 05/27/2021   10:30 AM 02/13/2021   11:02 AM  BMP  Glucose 70 - 99 mg/dL 139  103  100   BUN 6 - 23 mg/dL '13  13  11   '$ Creatinine 0.40 - 1.20 mg/dL 0.77  0.65  0.60   Sodium 135 - 145 mEq/L 139  140  140   Potassium 3.5 - 5.1 mEq/L 3.7  3.6  3.5   Chloride 96 - 112 mEq/L 105  101  102   CO2 19 - 32 mEq/L '27  31  31   '$ Calcium 8.4 - 10.5 mg/dL 9.4  10.3  9.4     Component     Latest Ref Rng 09/09/2021  Hemoglobin A1C     4.6 - 6.5 % 6.1    Component     Latest Ref Rng 09/09/2021  VITD     30.00 - 100.00 ng/mL 37.06    Urinalysis See HPI and Epic I have reviewed the labs.   Pertinent Imaging Narrative & Impression  CLINICAL DATA:  History of kidney stones, initial encounter   EXAM: ABDOMEN - 1 VIEW   COMPARISON:  11/30/2020   FINDINGS: Faceted gallstones are noted in the right upper quadrant. Stable 4 mm right renal stone is noted. No left renal calculi are seen. Scattered phleboliths are noted within the pelvis.    IMPRESSION: Stable right-sided renal stone.   Stable appearing cholelithiasis.     Electronically Signed   By: Inez Catalina M.D.   On: 12/02/2021 02:08  I have independently reviewed the films.  See HPI.  Assessment & Plan:    1. Right renal stone -appears to have migrated into the lower pole of the kidney -We discussed various treatment options for urolithiasis including observation with or without medical expulsive therapy, shockwave lithotripsy (SWL), ureteroscopy and laser lithotripsy with stent placement. -We discussed that management is based on stone size, location, density, patient co-morbidities, and patient preference.  -Stones <14m in size have a >80% spontaneous passage rate. Data surrounding the use of tamsulosin for medical expulsive therapy is controversial, but meta analyses suggests it is most efficacious for distal stones between 5-175min size. Possible side effects include dizziness/lightheadedness -SWL has a lower stone free rate in a single procedure, but also a lower complication rate compared to ureteroscopy and avoids a stent and associated stent related symptoms. Possible complications include renal hematoma, steinstrasse, and need for additional treatment. -Ureteroscopy with laser lithotripsy and stent placement has a higher stone free rate than SWL in a single procedure, however increased complication rate including possible infection, ureteral injury, bleeding, and stent related morbidity. Common stent related symptoms include dysuria, urgency/frequency, and flank pain. -she would like to address it only if it causes her pain, blood in the urine and/or infections -gave her return precautions      2. High risk hematuria -non-smoker -work up in 2017 - nephrolithiasis -cysto 2021 - NED -no reports of gross heme -UA negative for micro heme   Return if symptoms worsen or fail to improve.  These notes generated with voice recognition software. I apologize for  typographical errors.  [place]uMohrsvilleuMalmstrom AFBNC [id number]

## 2021-12-03 ENCOUNTER — Ambulatory Visit (INDEPENDENT_AMBULATORY_CARE_PROVIDER_SITE_OTHER): Payer: Medicare Other | Admitting: Urology

## 2021-12-03 ENCOUNTER — Encounter: Payer: Self-pay | Admitting: Urology

## 2021-12-03 VITALS — BP 137/65 | HR 68 | Ht 64.0 in | Wt 118.0 lb

## 2021-12-03 DIAGNOSIS — N200 Calculus of kidney: Secondary | ICD-10-CM | POA: Diagnosis not present

## 2021-12-03 DIAGNOSIS — R319 Hematuria, unspecified: Secondary | ICD-10-CM

## 2021-12-03 LAB — URINALYSIS, COMPLETE
Bilirubin, UA: NEGATIVE
Glucose, UA: NEGATIVE
Leukocytes,UA: NEGATIVE
Nitrite, UA: NEGATIVE
Protein,UA: NEGATIVE
Specific Gravity, UA: <1.005 — ABNORMAL LOW (ref 1.005–1.030)
Urobilinogen, Ur: 0.2 mg/dL (ref 0.2–1.0)
pH, UA: 6 (ref 5.0–7.5)

## 2021-12-03 LAB — MICROSCOPIC EXAMINATION: Bacteria, UA: NONE SEEN

## 2022-01-01 ENCOUNTER — Other Ambulatory Visit: Payer: Self-pay | Admitting: Family Medicine

## 2022-03-04 DIAGNOSIS — Z947 Corneal transplant status: Secondary | ICD-10-CM | POA: Diagnosis not present

## 2022-03-04 DIAGNOSIS — Z961 Presence of intraocular lens: Secondary | ICD-10-CM | POA: Diagnosis not present

## 2022-03-04 DIAGNOSIS — E119 Type 2 diabetes mellitus without complications: Secondary | ICD-10-CM | POA: Diagnosis not present

## 2022-03-04 LAB — HM DIABETES EYE EXAM

## 2022-03-08 DIAGNOSIS — Z961 Presence of intraocular lens: Secondary | ICD-10-CM | POA: Diagnosis not present

## 2022-03-10 ENCOUNTER — Other Ambulatory Visit: Payer: Medicare Other

## 2022-03-12 DIAGNOSIS — D2261 Melanocytic nevi of right upper limb, including shoulder: Secondary | ICD-10-CM | POA: Diagnosis not present

## 2022-03-12 DIAGNOSIS — Z85828 Personal history of other malignant neoplasm of skin: Secondary | ICD-10-CM | POA: Diagnosis not present

## 2022-03-12 DIAGNOSIS — L820 Inflamed seborrheic keratosis: Secondary | ICD-10-CM | POA: Diagnosis not present

## 2022-03-12 DIAGNOSIS — L400 Psoriasis vulgaris: Secondary | ICD-10-CM | POA: Diagnosis not present

## 2022-03-12 DIAGNOSIS — D485 Neoplasm of uncertain behavior of skin: Secondary | ICD-10-CM | POA: Diagnosis not present

## 2022-03-12 DIAGNOSIS — Z872 Personal history of diseases of the skin and subcutaneous tissue: Secondary | ICD-10-CM | POA: Diagnosis not present

## 2022-03-12 DIAGNOSIS — D2272 Melanocytic nevi of left lower limb, including hip: Secondary | ICD-10-CM | POA: Diagnosis not present

## 2022-03-14 ENCOUNTER — Other Ambulatory Visit: Payer: Self-pay | Admitting: Family Medicine

## 2022-03-14 DIAGNOSIS — I10 Essential (primary) hypertension: Secondary | ICD-10-CM

## 2022-03-17 ENCOUNTER — Ambulatory Visit: Payer: Medicare Other | Admitting: Family Medicine

## 2022-04-17 ENCOUNTER — Other Ambulatory Visit (INDEPENDENT_AMBULATORY_CARE_PROVIDER_SITE_OTHER): Payer: Medicare Other

## 2022-04-17 DIAGNOSIS — I10 Essential (primary) hypertension: Secondary | ICD-10-CM

## 2022-04-17 DIAGNOSIS — R7303 Prediabetes: Secondary | ICD-10-CM | POA: Diagnosis not present

## 2022-04-17 DIAGNOSIS — E785 Hyperlipidemia, unspecified: Secondary | ICD-10-CM | POA: Diagnosis not present

## 2022-04-17 DIAGNOSIS — E559 Vitamin D deficiency, unspecified: Secondary | ICD-10-CM | POA: Diagnosis not present

## 2022-04-17 LAB — LIPID PANEL
Cholesterol: 165 mg/dL (ref 0–200)
HDL: 70.6 mg/dL (ref >39.00)
LDL Cholesterol: 79 mg/dL (ref 0–99)
NonHDL: 94.36
Total CHOL/HDL Ratio: 2
Triglycerides: 77 mg/dL (ref 0.0–149.0)
VLDL: 15.4 mg/dL (ref 0.0–40.0)

## 2022-04-17 LAB — COMPREHENSIVE METABOLIC PANEL
ALT: 12 U/L (ref 0–35)
AST: 19 U/L (ref 0–37)
Albumin: 3.9 g/dL (ref 3.5–5.2)
Alkaline Phosphatase: 59 U/L (ref 39–117)
BUN: 12 mg/dL (ref 6–23)
CO2: 31 mEq/L (ref 19–32)
Calcium: 9.4 mg/dL (ref 8.4–10.5)
Chloride: 102 mEq/L (ref 96–112)
Creatinine, Ser: 0.64 mg/dL (ref 0.40–1.20)
GFR: 87.62 mL/min (ref >60.00)
Glucose, Bld: 94 mg/dL (ref 70–99)
Potassium: 3.9 mEq/L (ref 3.5–5.1)
Sodium: 142 mEq/L (ref 135–145)
Total Bilirubin: 0.7 mg/dL (ref 0.2–1.2)
Total Protein: 6.5 g/dL (ref 6.0–8.3)

## 2022-04-17 LAB — HEMOGLOBIN A1C: Hgb A1c MFr Bld: 6 % (ref 4.6–6.5)

## 2022-04-17 LAB — VITAMIN D 25 HYDROXY (VIT D DEFICIENCY, FRACTURES): VITD: 36.49 ng/mL (ref 30.00–100.00)

## 2022-04-21 ENCOUNTER — Ambulatory Visit (INDEPENDENT_AMBULATORY_CARE_PROVIDER_SITE_OTHER): Payer: Medicare Other | Admitting: Family Medicine

## 2022-04-21 ENCOUNTER — Encounter: Payer: Self-pay | Admitting: Family Medicine

## 2022-04-21 VITALS — BP 126/82 | HR 68 | Temp 97.8°F | Ht 64.0 in | Wt 120.6 lb

## 2022-04-21 DIAGNOSIS — S46819A Strain of other muscles, fascia and tendons at shoulder and upper arm level, unspecified arm, initial encounter: Secondary | ICD-10-CM | POA: Insufficient documentation

## 2022-04-21 DIAGNOSIS — I10 Essential (primary) hypertension: Secondary | ICD-10-CM | POA: Diagnosis not present

## 2022-04-21 DIAGNOSIS — M810 Age-related osteoporosis without current pathological fracture: Secondary | ICD-10-CM | POA: Diagnosis not present

## 2022-04-21 DIAGNOSIS — R7303 Prediabetes: Secondary | ICD-10-CM | POA: Diagnosis not present

## 2022-04-21 DIAGNOSIS — S46811A Strain of other muscles, fascia and tendons at shoulder and upper arm level, right arm, initial encounter: Secondary | ICD-10-CM

## 2022-04-21 DIAGNOSIS — F419 Anxiety disorder, unspecified: Secondary | ICD-10-CM | POA: Diagnosis not present

## 2022-04-21 NOTE — Assessment & Plan Note (Signed)
Chronic issue.  Patient will continue Celexa 10 mg daily.

## 2022-04-21 NOTE — Assessment & Plan Note (Addendum)
Chronic issue.  Patient will continue healthy diet and exercise.

## 2022-04-21 NOTE — Assessment & Plan Note (Signed)
Chronic issue.  She continues to decline medication.  She understands the purpose of this medication.  She will continue calcium and vitamin D supplementation.

## 2022-04-21 NOTE — Patient Instructions (Signed)
Nice to see you. Please continue to monitor your blood pressure. If your left shoulder area does not improve over the next week or 2 please let us know. You are due for a tetanus vaccine at this time.  You can get this at your pharmacy.

## 2022-04-21 NOTE — Assessment & Plan Note (Signed)
Patient with some spasm and discomfort on palpation though otherwise not much significant pain related to this.  Discussed altering how she sleeps.  Discussed using warm compresses.  If not improving over the next week or 2 we could refer for physical therapy.

## 2022-04-21 NOTE — Progress Notes (Signed)
[name], MD Phone: [phone]  Molly Gregory is a 72 y.o. female who presents today for f/u.  HYPERTENSION Disease Monitoring Home BP Monitoring 110s-120s/50s-60s Chest pain- no    Dyspnea- no Medications Compliance-  taking amlodipine, losartan.  Edema- no BMET    Component Value Date/Time   NA 142 04/17/2022 0809   K 3.9 04/17/2022 0809   CL 102 04/17/2022 0809   CO2 31 04/17/2022 0809   GLUCOSE 94 04/17/2022 0809   BUN 12 04/17/2022 0809   BUN 16 01/29/2016 1444   CREATININE 0.64 04/17/2022 0809   CALCIUM 9.4 04/17/2022 0809   GFRNONAA >60 07/20/2016 1514   GFRAA >60 07/20/2016 1514   Prediabetes: A1c was 6.0.  No polyuria or polydipsia.  She is sticking to a mostly plant-based diet though does eat meat on the weekends.  She is doing aerobic activity 5 days a week.  Occasional half-and-half sweet tea.  Anxiety: Patient notes this is okay.  The Celexa is beneficial.  No depression or SI.  Osteoporosis: No recent fractures.  She is taking calcium and vitamin D.  She is not interested in medication for this at this time.  Left trapezius discomfort: This has been going on about a week.  No injury.  No radiation.  Notes that she turns her head to the left bothers her.  No shoulder range of motion issues or pain.  Patient notes she has been drinking kangen water.  She notes this is alkalinized water.  She does not take any medicine with this kind of water.  She takes that with neutral water.  She reports drinking this water has seemed to help with her psoriasis.  Social History   Tobacco Use  Smoking Status Never  Smokeless Tobacco Never    Current Outpatient Medications on File Prior to Visit  Medication Sig Dispense Refill   albuterol (VENTOLIN HFA) 108 (90 Base) MCG/ACT inhaler SMARTSIG:2 Inhalation Via Inhaler Every 6 Hours PRN     Alpha Lipoic Acid 200 MG CAPS Take 200 mg by mouth in the morning and at bedtime.     amLODipine (NORVASC) 10 MG tablet  TAKE 1 TABLET BY MOUTH EVERY DAY 90 tablet 3   ascorbic acid (VITAMIN C) 500 MG tablet Take 500 mg by mouth daily.     Calcium Carbonate (CALCIUM 600 PO) Take by mouth daily.     Cholecalciferol (VITAMIN D3) 25 MCG (1000 UT) CAPS Take by mouth.     citalopram (CELEXA) 10 MG tablet TAKE 1 TABLET BY MOUTH EVERY DAY 90 tablet 1   losartan (COZAAR) 25 MG tablet TAKE 1/2 TABLET BY MOUTH EVERY DAY 45 tablet 3   Multiple Vitamins-Minerals (WOMENS MULTIVITAMIN PLUS PO) Take by mouth.     Niacinamide-Zn-Cu-Methfo-Se-Cr (NICOTINAMIDE) 750-27-2-0.5 MG TABS Take 500 mg by mouth 2 (two) times daily.     OTEZLA 30 MG TABS Take 1 tablet by mouth 2 (two) times daily.     prednisoLONE acetate (PRED FORTE) 1 % ophthalmic suspension 1 drop 4 (four) times daily.     psyllium (METAMUCIL) 58.6 % packet Take 1 packet by mouth daily.      triamcinolone cream (KENALOG) 0.1 % Apply 1 application topically 2 (two) times daily.      No current facility-administered medications on file prior to visit.     ROS see history of present illness  Objective  Physical Exam Vitals:   04/21/22 0900 04/21/22 0920  BP: 132/84 126/82  Pulse: 68   Temp:  97.8 F (36.6 C)   SpO2: 99%     BP Readings from Last 3 Encounters:  04/21/22 126/82  12/03/21 137/65  09/11/21 130/78   Wt Readings from Last 3 Encounters:  04/21/22 120 lb 9.6 oz (54.7 kg)  12/03/21 118 lb (53.5 kg)  09/11/21 118 lb 3.2 oz (53.6 kg)    Physical Exam Constitutional:      General: She is not in acute distress.    Appearance: She is not diaphoretic.  Neck:   Cardiovascular:     Rate and Rhythm: Normal rate and regular rhythm.     Heart sounds: Normal heart sounds.  Pulmonary:     Effort: Pulmonary effort is normal.     Breath sounds: Normal breath sounds.  Musculoskeletal:     Comments: Left shoulder with no discomfort on the passive range of motion  Skin:    General: Skin is warm and dry.  Neurological:     Mental Status: She is  alert.      Assessment/Plan: Please see individual problem list.  Essential hypertension, benign Assessment & Plan: Chronic issue.  Well-controlled at home.  She will continue amlodipine 10 mg daily and losartan 12.5 mg daily.  Labs reviewed.   Prediabetes Assessment & Plan: Chronic issue.  Patient will continue healthy diet and exercise.   Age-related osteoporosis without current pathological fracture Assessment & Plan: Chronic issue.  She continues to decline medication.  She understands the purpose of this medication.  She will continue calcium and vitamin D supplementation.   Strain of right trapezius muscle, initial encounter Assessment & Plan: Patient with some spasm and discomfort on palpation though otherwise not much significant pain related to this.  Discussed altering how she sleeps.  Discussed using warm compresses.  If not improving over the next week or 2 we could refer for physical therapy.   Anxiety Assessment & Plan: Chronic issue.  Patient will continue Celexa 10 mg daily.      Health Maintenance: Patient will get her tetanus vaccine at the pharmacy if she would like.  Reports she had the shingrix vaccine and completed the series though the pharmacy did not keep the records.   Return in about 6 months (around 10/20/2022).   [name], MD [place]

## 2022-04-21 NOTE — Assessment & Plan Note (Signed)
Chronic issue.  Well-controlled at home.  She will continue amlodipine 10 mg daily and losartan 12.5 mg daily.  Labs reviewed.

## 2022-05-22 DIAGNOSIS — Z1231 Encounter for screening mammogram for malignant neoplasm of breast: Secondary | ICD-10-CM | POA: Diagnosis not present

## 2022-05-22 LAB — HM MAMMOGRAPHY

## 2022-06-25 ENCOUNTER — Other Ambulatory Visit: Payer: Self-pay | Admitting: Family Medicine

## 2022-07-03 ENCOUNTER — Telehealth: Payer: Self-pay | Admitting: Family Medicine

## 2022-07-03 DIAGNOSIS — R7303 Prediabetes: Secondary | ICD-10-CM

## 2022-07-03 DIAGNOSIS — I10 Essential (primary) hypertension: Secondary | ICD-10-CM

## 2022-07-03 NOTE — Telephone Encounter (Signed)
Patient called and wanted to know if Dr Birdie Sons wanted to do labs before her 33m follow up in August.

## 2022-07-04 NOTE — Addendum Note (Signed)
Addended by: Birdie Sons, Pasqualina Colasurdo G on: 07/04/2022 10:01 AM   Modules accepted: Orders

## 2022-07-04 NOTE — Telephone Encounter (Signed)
Labs ordered.  Patient to be scheduled for labs 2 days prior to her visit.

## 2022-09-02 DIAGNOSIS — E119 Type 2 diabetes mellitus without complications: Secondary | ICD-10-CM | POA: Diagnosis not present

## 2022-09-02 DIAGNOSIS — Z961 Presence of intraocular lens: Secondary | ICD-10-CM | POA: Diagnosis not present

## 2022-09-02 DIAGNOSIS — Z947 Corneal transplant status: Secondary | ICD-10-CM | POA: Diagnosis not present

## 2022-09-26 ENCOUNTER — Other Ambulatory Visit: Payer: Self-pay | Admitting: Family Medicine

## 2022-09-30 DIAGNOSIS — Z85828 Personal history of other malignant neoplasm of skin: Secondary | ICD-10-CM | POA: Diagnosis not present

## 2022-09-30 DIAGNOSIS — C44722 Squamous cell carcinoma of skin of right lower limb, including hip: Secondary | ICD-10-CM | POA: Diagnosis not present

## 2022-09-30 DIAGNOSIS — D2271 Melanocytic nevi of right lower limb, including hip: Secondary | ICD-10-CM | POA: Diagnosis not present

## 2022-09-30 DIAGNOSIS — D225 Melanocytic nevi of trunk: Secondary | ICD-10-CM | POA: Diagnosis not present

## 2022-09-30 DIAGNOSIS — L400 Psoriasis vulgaris: Secondary | ICD-10-CM | POA: Diagnosis not present

## 2022-09-30 DIAGNOSIS — D2261 Melanocytic nevi of right upper limb, including shoulder: Secondary | ICD-10-CM | POA: Diagnosis not present

## 2022-09-30 DIAGNOSIS — D2262 Melanocytic nevi of left upper limb, including shoulder: Secondary | ICD-10-CM | POA: Diagnosis not present

## 2022-09-30 DIAGNOSIS — D485 Neoplasm of uncertain behavior of skin: Secondary | ICD-10-CM | POA: Diagnosis not present

## 2022-10-01 ENCOUNTER — Encounter (INDEPENDENT_AMBULATORY_CARE_PROVIDER_SITE_OTHER): Payer: Self-pay

## 2022-10-10 ENCOUNTER — Telehealth: Payer: Self-pay | Admitting: Family Medicine

## 2022-10-10 ENCOUNTER — Encounter: Payer: Self-pay | Admitting: Family Medicine

## 2022-10-10 ENCOUNTER — Ambulatory Visit (INDEPENDENT_AMBULATORY_CARE_PROVIDER_SITE_OTHER): Payer: Medicare Other | Admitting: Family Medicine

## 2022-10-10 VITALS — BP 116/60 | HR 75 | Temp 97.9°F | Resp 16 | Ht 64.0 in | Wt 121.2 lb

## 2022-10-10 DIAGNOSIS — R7303 Prediabetes: Secondary | ICD-10-CM

## 2022-10-10 DIAGNOSIS — E559 Vitamin D deficiency, unspecified: Secondary | ICD-10-CM

## 2022-10-10 DIAGNOSIS — E539 Vitamin B deficiency, unspecified: Secondary | ICD-10-CM

## 2022-10-10 DIAGNOSIS — I951 Orthostatic hypotension: Secondary | ICD-10-CM | POA: Diagnosis not present

## 2022-10-10 DIAGNOSIS — E785 Hyperlipidemia, unspecified: Secondary | ICD-10-CM

## 2022-10-10 LAB — CBC
HCT: 38.6 % (ref 35.0–45.0)
Hemoglobin: 12.8 g/dL (ref 11.7–15.5)
MCH: 29.9 pg (ref 27.0–33.0)
MCHC: 33.2 g/dL (ref 32.0–36.0)
MCV: 90.2 fL (ref 80.0–100.0)
MPV: 9.6 fL (ref 7.5–12.5)
Platelets: 276 10*3/uL (ref 140–400)
RBC: 4.28 10*6/uL (ref 3.80–5.10)
RDW: 12.6 % (ref 11.0–15.0)
WBC: 9.4 10*3/uL (ref 3.8–10.8)

## 2022-10-10 LAB — GLUCOSE, POCT (MANUAL RESULT ENTRY): POC Glucose: 154 mg/dl — AB (ref 70–99)

## 2022-10-10 NOTE — Assessment & Plan Note (Signed)
Check a1c 

## 2022-10-10 NOTE — Patient Instructions (Addendum)
It was a pleasure meeting you today. Thank you for allowing me to take part in your health care.  Our goals for today as we discussed include:  Decrease Amlodipine from 10 mg to 5 mg daily Continue Losartan 12.5 mg daily Monitor BP  If increases >130/80 restart medication Follow up with PCP on Monday   Increase water intake Use compression stockings   We will get some labs today.  If they are abnormal or we need to do something about them, I will call you.  If they are normal, I will send you a message on MyChart (if it is active) or a letter in the mail.  If you don't hear from [place] in 2 weeks, please call the office at the number below.   ECG today normal  If worsening symptoms please go to ED for evaluation  If you have any questions or concerns, please do not hesitate to call the office at [phone].  I look forward to our next visit and until then take care and stay safe.  Regards,   Dana Allan, MD   [facility]

## 2022-10-10 NOTE — Telephone Encounter (Signed)
Patient saw Dr. Clent Ridges at 1:00 on 10/10/22

## 2022-10-10 NOTE — Telephone Encounter (Signed)
Pt called in stating that her bp been running low. Pt stated that standing is 79/52, seating is 102/54 and laying down is 134/60. I offered appts for today as well as access nurse. Pt declined access nurse and will come in to see Molly Gregory today at 1pm. Pt stated if any chances or decided to go to UC will let us know.

## 2022-10-10 NOTE — Assessment & Plan Note (Addendum)
Currently normotensive. Orthostatics vitals in clinic positive today. Suspect decreased weight, paleo diet and lack of fluid and contributing to low BP. Need to adjust antihypertensive dosing. Decrease Amlodipine from 10 mg to 5 mg daily Continue Losartan 12.5 mg daily Check Bmet, CBC, A1c CBG 154 Increase fluids Compression stockings Monitor BP at home.  If remains low can hold antihypertensives and continue to monitor EKG: normal EKG, normal sinus rhythm.  Follow up with PCP in 3 days

## 2022-10-10 NOTE — Progress Notes (Signed)
SUBJECTIVE:   Chief Complaint  Patient presents with   Blood Pressure Check    Running low   HPI Presents to clinic for acute visit  Reports low blood pressure at home and shaking. Symptoms started today. Did home orthostatic vitals, laying 134/60, seated 102/54, standing 79/52 Denies chest pain, shortness of breath, heart palpitations, nausea during episodes.  Has had intermittent palpitations in past and evaluated by Cardiology.  Reports dizziness when standing that self resolves after few minutes.  Also reports having lost significant weight in the past 2-3 years.  Now on plant based diet. She reports not having much to eat today but generally eats three meals daily although can go long periods without eating.   Took medications today.  Currently on Amlodipine 10 mg and Losartan 12.5 mg  PERTINENT PMH / PSH: Prediabetes HTN  OBJECTIVE:  BP 116/60   Pulse 75   Temp 97.9 F (36.6 C)   Resp 16   Ht 5\' 4"  (1.626 m)   Wt 121 lb 4 oz (55 kg)   SpO2 99%   BMI 20.81 kg/m    Physical Exam Vitals reviewed.  Constitutional:      General: She is not in acute distress.    Appearance: Normal appearance. She is normal weight. She is not ill-appearing, toxic-appearing or diaphoretic.  Eyes:     General:        Right eye: No discharge.        Left eye: No discharge.     Conjunctiva/sclera: Conjunctivae normal.  Cardiovascular:     Rate and Rhythm: Normal rate and regular rhythm.     Heart sounds: Normal heart sounds.  Pulmonary:     Effort: Pulmonary effort is normal.     Breath sounds: Normal breath sounds.  Abdominal:     General: Bowel sounds are normal.  Musculoskeletal:        General: Normal range of motion.  Skin:    General: Skin is warm and dry.  Neurological:     General: No focal deficit present.     Mental Status: She is alert and oriented to person, place, and time. Mental status is at baseline.     Sensory: No sensory deficit.     Motor: No weakness.      Coordination: Coordination normal.  Psychiatric:        Mood and Affect: Mood normal.        Behavior: Behavior normal.        Thought Content: Thought content normal.        Judgment: Judgment normal.   Orthostatic Vitals for the past 48 hrs (Last 6 readings):  BP Pulse Patient Position (if appropriate) BP- Standing at 0 minutes Pulse- Standing at 0 minutes BP- Sitting Pulse- Sitting BP- Lying Pulse- Lying  10/10/22 1306 116/60 75 -- -- -- -- -- -- --  10/10/22 1320 -- -- Orthostatic Vitals 107/61 71 128/64 72 125/62 70        10/10/2022    1:10 PM 04/21/2022    9:03 AM 09/11/2021    8:35 AM 02/13/2021   10:35 AM 07/12/2020    8:09 AM  Depression screen PHQ 2/9  Decreased Interest 0 0 0 0 0  Down, Depressed, Hopeless 0 0 0 0 0  PHQ - 2 Score 0 0 0 0 0  Altered sleeping 0 0     Tired, decreased energy 0 0     Change in appetite 0 0  Feeling bad or failure about yourself  0 0     Trouble concentrating 0 0     Moving slowly or fidgety/restless 0 0     Suicidal thoughts 0 0     PHQ-9 Score 0 0     Difficult doing work/chores Not difficult at all Not difficult at all         10/10/2022    1:10 PM 04/21/2022    9:04 AM  GAD 7 : Generalized Anxiety Score  Nervous, Anxious, on Edge 0 0  Control/stop worrying 0 0  Worry too much - different things 0 0  Trouble relaxing 0 0  Restless 0 0  Easily annoyed or irritable 0 0  Afraid - awful might happen 0 0  Total GAD 7 Score 0 0  Anxiety Difficulty Not difficult at all Not difficult at all    ASSESSMENT/PLAN:  Orthostatic hypotension Assessment & Plan: Currently normotensive. Orthostatics vitals in clinic positive today. Suspect decreased weight, paleo diet and lack of fluid and contributing to low BP. Need to adjust antihypertensive dosing. Decrease Amlodipine from 10 mg to 5 mg daily Continue Losartan 12.5 mg daily Check Bmet, CBC, A1c CBG 154 Increase fluids Compression stockings Monitor BP at home.  If remains low can  hold antihypertensives and continue to monitor EKG: normal EKG, normal sinus rhythm.  Follow up with PCP in 3 days  Orders: -     POCT glucose (manual entry) -     EKG 12-Lead -     CBC -     Basic metabolic panel  Prediabetes Assessment & Plan: Check a1c  Orders: -     Hemoglobin A1c   PDMP reviewed  Return in about 3 days (around 10/13/2022) for PCP.  Dana Allan, MD

## 2022-10-13 ENCOUNTER — Ambulatory Visit (INDEPENDENT_AMBULATORY_CARE_PROVIDER_SITE_OTHER): Payer: Medicare Other | Admitting: Family Medicine

## 2022-10-13 ENCOUNTER — Encounter: Payer: Self-pay | Admitting: Family Medicine

## 2022-10-13 VITALS — BP 120/60 | HR 73 | Temp 98.1°F | Ht 64.0 in | Wt 121.4 lb

## 2022-10-13 DIAGNOSIS — I951 Orthostatic hypotension: Secondary | ICD-10-CM

## 2022-10-13 MED ORDER — AMLODIPINE BESYLATE 10 MG PO TABS: 5.0000 mg | ORAL_TABLET | Freq: Every day | ORAL | 3 refills | Status: DC

## 2022-10-13 NOTE — Patient Instructions (Signed)
Nice to see you. Please continue amlodipine 5 mg daily and losartan 12.5 mg daily.  If your lightheadedness returns please let us know.

## 2022-10-13 NOTE — Progress Notes (Signed)
Marikay Alar, MD Phone: [phone]  Molly Gregory is a 72 y.o. female who presents today for follow-up.  Hypertension/orthostasis: Patient saw Dr. Clent Ridges last week for orthostasis.  Dr. Clent Ridges reduced her amlodipine to 5 mg daily.  She continued the patient on losartan 12.5 mg daily.  Patient has no chest pain or shortness of breath.  She notes lightheadedness has resolved.  She has tried to drink more water.  The patient thinks she was getting this way because she typically would not not eat before 11 AM.  Patient brings in blood pressure readings that do still show some drop in with going from laying to standing though overall appear improved.  Social History   Tobacco Use  Smoking Status Never  Smokeless Tobacco Never    Current Outpatient Medications on File Prior to Visit  Medication Sig Dispense Refill   albuterol (VENTOLIN HFA) 108 (90 Base) MCG/ACT inhaler SMARTSIG:2 Inhalation Via Inhaler Every 6 Hours PRN     Alpha Lipoic Acid 200 MG CAPS Take 200 mg by mouth in the morning and at bedtime.     Calcium Carbonate (CALCIUM 600 PO) Take by mouth daily.     Cholecalciferol (VITAMIN D3) 25 MCG (1000 UT) CAPS Take by mouth.     citalopram (CELEXA) 10 MG tablet TAKE 1 TABLET BY MOUTH EVERY DAY 90 tablet 1   losartan (COZAAR) 25 MG tablet TAKE 1/2 TABLET BY MOUTH EVERY DAY 45 tablet 3   Multiple Vitamins-Minerals (WOMENS MULTIVITAMIN PLUS PO) Take by mouth.     Niacinamide-Zn-Cu-Methfo-Se-Cr (NICOTINAMIDE) 750-27-2-0.5 MG TABS Take 500 mg by mouth 2 (two) times daily.     OTEZLA 30 MG TABS Take 1 tablet by mouth 2 (two) times daily.     prednisoLONE acetate (PRED FORTE) 1 % ophthalmic suspension 1 drop 4 (four) times daily.     psyllium (METAMUCIL) 58.6 % packet Take 1 packet by mouth daily.      triamcinolone cream (KENALOG) 0.1 % Apply 1 application topically 2 (two) times daily.      ascorbic acid (VITAMIN C) 500 MG tablet Take 500 mg by mouth daily. (Patient not taking:  Reported on 10/13/2022)     No current facility-administered medications on file prior to visit.     ROS see history of present illness  Objective  Physical Exam Vitals:   10/13/22 1347  BP: 120/60  Pulse: 73  Temp: 98.1 F (36.7 C)  SpO2: 97%    BP Readings from Last 3 Encounters:  10/13/22 120/60  10/10/22 116/60  04/21/22 126/82   Wt Readings from Last 3 Encounters:  10/13/22 121 lb 6.4 oz (55.1 kg)  10/10/22 121 lb 4 oz (55 kg)  04/21/22 120 lb 9.6 oz (54.7 kg)    Physical Exam Constitutional:      General: She is not in acute distress.    Appearance: She is not diaphoretic.  Cardiovascular:     Rate and Rhythm: Normal rate and regular rhythm.     Heart sounds: Normal heart sounds.  Pulmonary:     Effort: Pulmonary effort is normal.     Breath sounds: Normal breath sounds.  Skin:    General: Skin is warm and dry.  Neurological:     Mental Status: She is alert.      Assessment/Plan: Please see individual problem list.  Orthostatic hypotension Assessment & Plan: Right headedness has improved.  Patient will remain on amlodipine 5 mg daily and losartan 12.5 mg daily.  If she has  further lightheadedness she will let us know.  Discussed she could go back to checking blood pressures in a seated position.   Other orders -     amLODIPine Besylate; Take 0.5 tablets (5 mg total) by mouth daily.  Dispense: 45 tablet; Refill: 3    Return in about 3 months (around 01/13/2023) for Hypertension.   Marikay Alar, MD [facility]

## 2022-10-13 NOTE — Assessment & Plan Note (Signed)
Right headedness has improved.  Patient will remain on amlodipine 5 mg daily and losartan 12.5 mg daily.  If she has further lightheadedness she will let us know.  Discussed she could go back to checking blood pressures in a seated position.

## 2022-10-16 ENCOUNTER — Other Ambulatory Visit: Payer: Medicare Other

## 2022-10-21 ENCOUNTER — Ambulatory Visit: Payer: Medicare Other | Admitting: Family Medicine

## 2022-10-29 DIAGNOSIS — C44722 Squamous cell carcinoma of skin of right lower limb, including hip: Secondary | ICD-10-CM | POA: Diagnosis not present

## 2022-10-29 DIAGNOSIS — D2371 Other benign neoplasm of skin of right lower limb, including hip: Secondary | ICD-10-CM | POA: Diagnosis not present

## 2022-12-30 ENCOUNTER — Telehealth: Payer: Self-pay

## 2022-12-30 NOTE — Telephone Encounter (Signed)
Noted.  She can go back to amlodipine 10 mg daily.  If she starts to have lightheadedness again she needs to let us know.  She is to keep her follow-up visit.

## 2022-12-30 NOTE — Telephone Encounter (Signed)
Patient states Dr. Marikay Alar cut her amLODipine (NORVASC) 10 MG tablet in half.  Patient states she thinks she may need to go back on the whole pill because her blood pressure has been high in the mornings:  Patient states 163/80 is the highest reading she has had after she waits a while after getting out of bed.  Patient states she has had 170-something when she first gets out of bed.  Patient states she is fine right now.  Patient states she has an appointment on 01/13/2023 with Dr. Marikay Alar, but she would like to know if she needs to adjust her medication prior to this visit.  Patient states she would like for [place] to please call and let her know.

## 2022-12-31 NOTE — Telephone Encounter (Signed)
Pt was notified.  

## 2022-12-31 NOTE — Telephone Encounter (Signed)
Noted. Her BPs are fine on the increased dose of the amlodipine. If she starts to feel light headed she needs to let us know. She needs to continue to monitor her BP and if her diastolic BP drops below 60 she needs to let us know.

## 2022-12-31 NOTE — Telephone Encounter (Signed)
Patient states she is following-up on her message from yesterday since she hasn't heard from [place].  Patient states she did go ahead and take the 10 mg amlodipine since her blood pressure was not coming down yesterday.  Patient states she had been eating more salt and then had a headache.  Patient states she thinks possibly the extra sodium was contributing to her increase in blood pressure.  Patient states her blood pressure was 143/76 when she first got up this morning.  Patient states right now, after taking her amlodipine 10 mg, her blood pressure is 124/61.  I did read Dr. Bernardo Heater message to patient.

## 2023-01-13 ENCOUNTER — Ambulatory Visit (INDEPENDENT_AMBULATORY_CARE_PROVIDER_SITE_OTHER): Payer: Medicare Other | Admitting: Family Medicine

## 2023-01-13 ENCOUNTER — Encounter: Payer: Self-pay | Admitting: Family Medicine

## 2023-01-13 VITALS — BP 130/74 | HR 74 | Temp 97.7°F | Ht 64.0 in | Wt 122.2 lb

## 2023-01-13 DIAGNOSIS — I10 Essential (primary) hypertension: Secondary | ICD-10-CM | POA: Diagnosis not present

## 2023-01-13 NOTE — Progress Notes (Signed)
Marikay Alar, MD Phone: [phone]  Molly Gregory is a 72 y.o. female who presents today for f/u.  HYPERTENSION Disease Monitoring Home BP Monitoring typically 110s-120s/55-63 several hours after taking her medications Chest pain- no    Dyspnea- no Medications Compliance-  taking amlodipine, losartan. Lightheadedness-  no  Edema- no BMET    Component Value Date/Time   NA 141 10/10/2022 1422   K 3.9 10/10/2022 1422   CL 102 10/10/2022 1422   CO2 25 10/10/2022 1422   GLUCOSE 115 (H) 10/10/2022 1422   BUN 21 10/10/2022 1422   BUN 16 01/29/2016 1444   CREATININE 0.86 10/10/2022 1422   CALCIUM 9.7 10/10/2022 1422   GFRNONAA >60 07/20/2016 1514   GFRAA >60 07/20/2016 1514     Social History   Tobacco Use  Smoking Status Never  Smokeless Tobacco Never    Current Outpatient Medications on File Prior to Visit  Medication Sig Dispense Refill   albuterol (VENTOLIN HFA) 108 (90 Base) MCG/ACT inhaler SMARTSIG:2 Inhalation Via Inhaler Every 6 Hours PRN     Alpha Lipoic Acid 200 MG CAPS Take 200 mg by mouth in the morning and at bedtime.     amLODipine (NORVASC) 10 MG tablet Take 0.5 tablets (5 mg total) by mouth daily. 45 tablet 3   ascorbic acid (VITAMIN C) 500 MG tablet Take 500 mg by mouth daily.     Calcium Carbonate (CALCIUM 600 PO) Take by mouth daily.     Cholecalciferol (VITAMIN D3) 25 MCG (1000 UT) CAPS Take by mouth.     citalopram (CELEXA) 10 MG tablet TAKE 1 TABLET BY MOUTH EVERY DAY 90 tablet 1   losartan (COZAAR) 25 MG tablet TAKE 1/2 TABLET BY MOUTH EVERY DAY 45 tablet 3   Multiple Vitamins-Minerals (WOMENS MULTIVITAMIN PLUS PO) Take by mouth.     Niacinamide-Zn-Cu-Methfo-Se-Cr (NICOTINAMIDE) 750-27-2-0.5 MG TABS Take 500 mg by mouth 2 (two) times daily.     OTEZLA 30 MG TABS Take 1 tablet by mouth 2 (two) times daily.     prednisoLONE acetate (PRED FORTE) 1 % ophthalmic suspension 1 drop 4 (four) times daily.     psyllium (METAMUCIL) 58.6 % packet Take 1  packet by mouth daily.      triamcinolone cream (KENALOG) 0.1 % Apply 1 application topically 2 (two) times daily.      No current facility-administered medications on file prior to visit.     ROS see history of present illness  Objective  Physical Exam Vitals:   01/13/23 0905 01/13/23 0917  BP: (!) 140/78 130/74  Pulse: 74   Temp: 97.7 F (36.5 C)   SpO2: 99%     BP Readings from Last 3 Encounters:  01/13/23 130/74  10/13/22 120/60  10/10/22 116/60   Wt Readings from Last 3 Encounters:  01/13/23 122 lb 3.2 oz (55.4 kg)  10/13/22 121 lb 6.4 oz (55.1 kg)  10/10/22 121 lb 4 oz (55 kg)    Physical Exam Constitutional:      General: She is not in acute distress.    Appearance: She is not diaphoretic.  Cardiovascular:     Rate and Rhythm: Normal rate and regular rhythm.     Heart sounds: Normal heart sounds.  Pulmonary:     Effort: Pulmonary effort is normal.     Breath sounds: Normal breath sounds.  Musculoskeletal:     Right lower leg: No edema.     Left lower leg: No edema.  Skin:    General:  Skin is warm and dry.  Neurological:     Mental Status: She is alert.      Assessment/Plan: Please see individual problem list.  Essential hypertension, benign Assessment & Plan: Chronic issue.  Adequately controlled.  Patient will continue amlodipine 10 mg daily and losartan 12.5 mg daily.  She will monitor for lightheadedness and monitor for blood pressures that dropped less than 90/55.  If she has those she needs to let us know.  She will follow-up in 6 months for transfer of care.    Return in about 6 months (around 07/13/2023) for Transfer of care with Dr. Clent Ridges.   Marikay Alar, MD [facility]

## 2023-01-13 NOTE — Assessment & Plan Note (Signed)
Chronic issue.  Adequately controlled.  Patient will continue amlodipine 10 mg daily and losartan 12.5 mg daily.  She will monitor for lightheadedness and monitor for blood pressures that dropped less than 90/55.  If she has those she needs to let us know.  She will follow-up in 6 months for transfer of care.

## 2023-02-14 ENCOUNTER — Other Ambulatory Visit: Payer: Self-pay | Admitting: Family Medicine

## 2023-02-25 ENCOUNTER — Other Ambulatory Visit: Payer: Self-pay | Admitting: Family Medicine

## 2023-02-25 DIAGNOSIS — I10 Essential (primary) hypertension: Secondary | ICD-10-CM

## 2023-03-09 DIAGNOSIS — Z961 Presence of intraocular lens: Secondary | ICD-10-CM | POA: Diagnosis not present

## 2023-04-27 DIAGNOSIS — Z85828 Personal history of other malignant neoplasm of skin: Secondary | ICD-10-CM | POA: Diagnosis not present

## 2023-04-27 DIAGNOSIS — D225 Melanocytic nevi of trunk: Secondary | ICD-10-CM | POA: Diagnosis not present

## 2023-04-27 DIAGNOSIS — D2262 Melanocytic nevi of left upper limb, including shoulder: Secondary | ICD-10-CM | POA: Diagnosis not present

## 2023-04-27 DIAGNOSIS — L400 Psoriasis vulgaris: Secondary | ICD-10-CM | POA: Diagnosis not present

## 2023-04-27 DIAGNOSIS — D2271 Melanocytic nevi of right lower limb, including hip: Secondary | ICD-10-CM | POA: Diagnosis not present

## 2023-04-27 DIAGNOSIS — D2261 Melanocytic nevi of right upper limb, including shoulder: Secondary | ICD-10-CM | POA: Diagnosis not present

## 2023-05-11 ENCOUNTER — Ambulatory Visit
Admission: RE | Admit: 2023-05-11 | Discharge: 2023-05-11 | Disposition: A | Discharge status: Home / Self Care | Source: Ambulatory Visit | Attending: Urology | Admitting: Urology

## 2023-05-11 ENCOUNTER — Ambulatory Visit
Admission: RE | Admit: 2023-05-11 | Discharge: 2023-05-11 | Disposition: A | Discharge status: Home / Self Care | Attending: Urology | Admitting: Urology

## 2023-05-11 ENCOUNTER — Other Ambulatory Visit: Payer: Self-pay | Admitting: Urology

## 2023-05-11 ENCOUNTER — Ambulatory Visit (INDEPENDENT_AMBULATORY_CARE_PROVIDER_SITE_OTHER): Admitting: Urology

## 2023-05-11 ENCOUNTER — Other Ambulatory Visit
Admission: RE | Admit: 2023-05-11 | Discharge: 2023-05-11 | Disposition: A | Discharge status: Home / Self Care | Source: Home / Self Care | Attending: Urology | Admitting: Urology

## 2023-05-11 ENCOUNTER — Encounter: Payer: Self-pay | Admitting: Urology

## 2023-05-11 VITALS — BP 122/55 | HR 69 | Ht 64.0 in | Wt 121.6 lb

## 2023-05-11 DIAGNOSIS — N200 Calculus of kidney: Secondary | ICD-10-CM | POA: Diagnosis not present

## 2023-05-11 DIAGNOSIS — R3129 Other microscopic hematuria: Secondary | ICD-10-CM | POA: Diagnosis not present

## 2023-05-11 DIAGNOSIS — R109 Unspecified abdominal pain: Secondary | ICD-10-CM | POA: Diagnosis not present

## 2023-05-11 DIAGNOSIS — I878 Other specified disorders of veins: Secondary | ICD-10-CM | POA: Diagnosis not present

## 2023-05-11 DIAGNOSIS — R319 Hematuria, unspecified: Secondary | ICD-10-CM

## 2023-05-11 DIAGNOSIS — N201 Calculus of ureter: Secondary | ICD-10-CM | POA: Diagnosis not present

## 2023-05-11 DIAGNOSIS — R310 Gross hematuria: Secondary | ICD-10-CM

## 2023-05-11 LAB — URINALYSIS, COMPLETE (UACMP) WITH MICROSCOPIC
Glucose, UA: NEGATIVE mg/dL
Leukocytes,Ua: NEGATIVE
Nitrite: NEGATIVE
Protein, ur: >300 mg/dL — AB
RBC / HPF: >50 RBC/hpf (ref 0–5)
Specific Gravity, Urine: 1.015 (ref 1.005–1.030)
Squamous Epithelial / HPF: NONE SEEN /HPF (ref 0–5)
WBC, UA: NONE SEEN WBC/hpf (ref 0–5)
pH: 5.5 (ref 5.0–8.0)

## 2023-05-11 NOTE — Progress Notes (Signed)
 03/16/2018  3:26 PM   Molly Gregory 1950-07-26 [id number]  Referring provider: No referring provider defined for this encounter.  Urological history: 1. High risk hematuria -non-smoker -CTU 2017 bilateral nephrolithiasis -cysto 2017 NED -non-contrast CT 2021 - right renal stone -cysto 2021 - NED  2. Right renal stone -non-contrast CT 2021 -right renal stone -KUB (11/2021) 5 mm right renal stone  Chief Complaint  Patient presents with   Hematuria    HPI: Molly Gregory is a 73 y.o. female who presents today for blood in her urine and lower back pain.  Previous records reviewed.     She had a sudden onset of gross hematuria through the weekend.  She showed me photographs of the urine in the toilet and it was a very translucent red.  She has had some lower back pain, but she states it may be due to her osteoporosis.  Patient denies any modifying or aggravating factors.  Patient denies any recent UTI's, dysuria or suprapubic/flank pain.  Patient denies any fevers, chills, nausea or vomiting.    UA with hazy, specific gravity 1.015, pH 5.5, large heme, small bilirubin, trace ketone, greater than 300 protein, greater than 50 RBCs, few bacteria and calcium oxalate crystals present.  KUB demonstrates that the right lower pole renal stone has migrated into the proximal right ureter  PMH: Past Medical History:  Diagnosis Date   Allergy    Anxiety    Arthritis    Diverticulosis    H/O exercise stress test    normal   H/O psoriasis    Head injury, closed 2013   Hiatal hernia    History of chicken pox    Hx of acute bronchitis    Hx of migraines    Hyperlipidemia    Hypertension    Irregular heart beat    Kidney stone     Surgical History: Past Surgical History:  Procedure Laterality Date   CARPAL TUNNEL RELEASE Bilateral    both hands, Dr. Tad Moore   CATARACT EXTRACTION Left    COLONOSCOPY WITH PROPOFOL N/A 04/24/2020   Procedure: COLONOSCOPY WITH PROPOFOL;   Surgeon: Midge Minium, MD;  Location: [facility] ENDOSCOPY;  Service: Endoscopy;  Laterality: N/A;  COVID POSITIVE ON 04/09/2020 - ASYMPTOMATIC   COLONOSCOPY WITH PROPOFOL N/A 04/23/2021   Procedure: COLONOSCOPY WITH PROPOFOL;  Surgeon: Midge Minium, MD;  Location: [facility] ENDOSCOPY;  Service: Endoscopy;  Laterality: N/A;   EYE SURGERY Left    cornea transplant, Dr. Dellie Burns   TRIGGER FINGER RELEASE Bilateral    TUBAL LIGATION      Home Medications:  Allergies as of 05/11/2023       Reactions   Penicillins         Medication List        Accurate as of May 11, 2023  3:26 PM. If you have any questions, ask your nurse or doctor.          albuterol 108 (90 Base) MCG/ACT inhaler Commonly known as: VENTOLIN HFA SMARTSIG:2 Inhalation Via Inhaler Every 6 Hours PRN   Alpha Lipoic Acid 200 MG Caps Take 200 mg by mouth in the morning and at bedtime.   amLODipine 10 MG tablet Commonly known as: NORVASC TAKE 1 TABLET BY MOUTH EVERY DAY   ascorbic acid 500 MG tablet Commonly known as: VITAMIN C Take 500 mg by mouth daily.   CALCIUM 600 PO Take by mouth daily.   citalopram 10 MG tablet Commonly known as: CELEXA TAKE 1 TABLET  BY MOUTH EVERY DAY   losartan 25 MG tablet Commonly known as: COZAAR TAKE 1/2 TABLET BY MOUTH DAILY   Nicotinamide 750-27-2-0.5 MG Tabs Take 500 mg by mouth 2 (two) times daily.   Otezla 30 MG Tabs Generic drug: Apremilast Take 1 tablet by mouth 2 (two) times daily.   prednisoLONE acetate 1 % ophthalmic suspension Commonly known as: PRED FORTE 1 drop 4 (four) times daily.   psyllium 58.6 % packet Commonly known as: METAMUCIL Take 1 packet by mouth daily.   triamcinolone cream 0.1 % Commonly known as: KENALOG Apply 1 application topically 2 (two) times daily.   Vitamin D3 25 MCG (1000 UT) Caps Take by mouth.   WOMENS MULTIVITAMIN PLUS PO Take by mouth.        Allergies:  Allergies  Allergen Reactions   Penicillins     Family  History: Family History  Problem Relation Age of Onset   Hypertension Maternal Aunt    Aneurysm Maternal Aunt    Kidney failure Maternal Aunt    Diabetes Maternal Grandmother    Heart disease Maternal Grandmother    Hypertension Maternal Grandmother    Diabetes Paternal Grandmother    Heart disease Paternal Grandmother    Hypertension Paternal Grandmother    Kidney cancer Cousin    Bladder Cancer Neg Hx     Social History:  reports that she has never smoked. She has never used smokeless tobacco. She reports that she does not drink alcohol and does not use drugs.  ROS: For pertinent review of systems please refer to history of present illness  Physical Exam: BP (!) 122/55 (BP Location: Left Arm, Patient Position: Sitting, Cuff Size: Normal)   Pulse 69   Ht 5\' 4"  (1.626 m)   Wt 121 lb 9.6 oz (55.2 kg)   SpO2 99%   BMI 20.87 kg/m   Constitutional:  Well nourished. Alert and oriented, No acute distress. HEENT: [place] AT, moist mucus membranes.  Trachea midline, no masses. Cardiovascular: No clubbing, cyanosis, or edema. Respiratory: Normal respiratory effort, no increased work of breathing. Neurologic: Grossly intact, no focal deficits, moving all 4 extremities. Psychiatric: Normal mood and affect.    Laboratory Data: BMET    Component Value Date/Time   NA 141 10/10/2022 1422   K 3.9 10/10/2022 1422   CL 102 10/10/2022 1422   CO2 25 10/10/2022 1422   GLUCOSE 115 (H) 10/10/2022 1422   BUN 21 10/10/2022 1422   BUN 16 01/29/2016 1444   CREATININE 0.86 10/10/2022 1422   CALCIUM 9.7 10/10/2022 1422   GFRNONAA >60 07/20/2016 1514    CBC    Component Value Date/Time   WBC 9.4 10/10/2022 1422   RBC 4.28 10/10/2022 1422   HGB 12.8 10/10/2022 1422   HCT 38.6 10/10/2022 1422   PLT 276 10/10/2022 1422   MCV 90.2 10/10/2022 1422   MCH 29.9 10/10/2022 1422   MCHC 33.2 10/10/2022 1422   RDW 12.6 10/10/2022 1422   LYMPHSABS 1.9 08/27/2015 0844   MONOABS 0.8 08/27/2015 0844    EOSABS 0.1 08/27/2015 0844   BASOSABS 0.0 08/27/2015 0844    Component     Latest Ref Rng 10/10/2022  Hemoglobin A1C     <5.7 % of total Hgb 6.1 (H)   Mean Plasma Glucose     mg/dL 578   eAG (mmol/L)     mmol/L 7.1     Legend: (H) High  Urinalysis See HPI and Epic I have reviewed the labs.  Pertinent Imaging: KUB with a 6 mm proximal right ureteral stone, Radiologist interpretation still pending I have independently reviewed the films.  See HPI.   Assessment & Plan:    1. Right renal stone -We discussed various treatment options for urolithiasis including observation with or without medical expulsive therapy, shockwave lithotripsy (SWL), ureteroscopy and laser lithotripsy with stent placement. -We discussed that management is based on stone size, location, density, patient co-morbidities, and patient preference.  -Stones <90mm in size have a >80% spontaneous passage rate. Data surrounding the use of tamsulosin for medical expulsive therapy is controversial, but meta analyses suggests it is most efficacious for distal stones between 5-65mm in size. Possible side effects include dizziness/lightheadedness  -SWL has a lower stone free rate in a single procedure, but also a lower complication rate compared to ureteroscopy and avoids a stent and associated stent related symptoms. Possible complications include renal hematoma, steinstrasse, and need for additional treatment. -Ureteroscopy with laser lithotripsy and stent placement has a higher stone free rate than SWL in a single procedure, however increased complication rate including possible infection, ureteral injury, bleeding, and stent related morbidity. Common stent related symptoms include dysuria, urgency/frequency, and flank pain. -she would like to have ESWL -Explained to the patient that with ESWL, shock waves are focused on the stone using X-rays to pinpoint the stone.  The shock waves are fired repeatedly which usually causes the  stone to break into small pieces which pass out in the urine over the next few weeks.  They will go home that day and may be able to resume normal activities in three days.  They should be given a strainer and they need to collect any fragments/sediments that they pass for analysis.   Risks involved with the procedure consist of bruising of the skin and kidney, possible long-term kidney damage, development of HTN, damage to the bowel, spleen, liver, pancreas, female organs or lungs, hematuria, hematuria serious enough to require transfusion or surgical repair, formation of a hematoma, infection or sepsis.  If the stone is too large or dense, it may not break apart or break apart in large pieces and cause "Steinstrasse."  If this happens, it would result in the need for another procedure (likely URS/LL/stent placement).  IV sedation is typically used, but in rare instances general anesthesia is used with the risk of irregular heart beat, irregular BP, stroke, MI, CVA, paralysis, coma and/or death -Schedule right ESWL   -UA w/ micro heme, no signs of infection  -Urine culture pending    2. High risk hematuria -non-smoker -work up in 2017 - nephrolithiasis -cysto 2021 - NED -reports of gross heme -UA w/ micro heme, but likely secondary to her migrating stone -Will reassess when she completes definitive treatment of said stone   Return for Right ESWL .  These notes generated with voice recognition software. I apologize for typographical errors.  Michiel Cowboy, PA-C  [facility] [address] [place], [place] [address] [phone]    ESWL ORDER FORM  Expected date of procedure: 05/14/2023   Surgeon: Irineo Axon, MD  Post op standing: 2-4wk follow up w/KUB prior  Anticoagulation/Aspirin/NSAID standing order: Hold all 72 hours prior  Anesthesia standing order: MAC  VTE standing: SCD's  Dx: Right Ureteral Stone  Procedure: right  Extracorporeal shock wave lithotripsy  CPT : 50590  Standing Order Set:   *NPO after mn, KUB  *NS 118ml/hr, Cipro 500mg  PO, Benadryl 25mg  PO, Valium 10mg  PO, Zofran  4mg  IV  Medications if other than standing orders:   NONE

## 2023-05-11 NOTE — H&P (View-Only) (Signed)
 03/16/2018  3:26 PM   Molly Gregory 1950-07-26 [id number]  Referring provider: No referring provider defined for this encounter.  Urological history: 1. High risk hematuria -non-smoker -CTU 2017 bilateral nephrolithiasis -cysto 2017 NED -non-contrast CT 2021 - right renal stone -cysto 2021 - NED  2. Right renal stone -non-contrast CT 2021 -right renal stone -KUB (11/2021) 5 mm right renal stone  Chief Complaint  Patient presents with   Hematuria    HPI: Molly Gregory is a 72 y.o. female who presents today for blood in her urine and lower back pain.  Previous records reviewed.     She had a sudden onset of gross hematuria through the weekend.  She showed me photographs of the urine in the toilet and it was a very translucent red.  She has had some lower back pain, but she states it may be due to her osteoporosis.  Patient denies any modifying or aggravating factors.  Patient denies any recent UTI's, dysuria or suprapubic/flank pain.  Patient denies any fevers, chills, nausea or vomiting.    UA with hazy, specific gravity 1.015, pH 5.5, large heme, small bilirubin, trace ketone, greater than 300 protein, greater than 50 RBCs, few bacteria and calcium oxalate crystals present.  KUB demonstrates that the right lower pole renal stone has migrated into the proximal right ureter  PMH: Past Medical History:  Diagnosis Date   Allergy    Anxiety    Arthritis    Diverticulosis    H/O exercise stress test    normal   H/O psoriasis    Head injury, closed 2013   Hiatal hernia    History of chicken pox    Hx of acute bronchitis    Hx of migraines    Hyperlipidemia    Hypertension    Irregular heart beat    Kidney stone     Surgical History: Past Surgical History:  Procedure Laterality Date   CARPAL TUNNEL RELEASE Bilateral    both hands, Dr. Tad Moore   CATARACT EXTRACTION Left    COLONOSCOPY WITH PROPOFOL N/A 04/24/2020   Procedure: COLONOSCOPY WITH PROPOFOL;   Surgeon: Midge Minium, MD;  Location: [facility] ENDOSCOPY;  Service: Endoscopy;  Laterality: N/A;  COVID POSITIVE ON 04/09/2020 - ASYMPTOMATIC   COLONOSCOPY WITH PROPOFOL N/A 04/23/2021   Procedure: COLONOSCOPY WITH PROPOFOL;  Surgeon: Midge Minium, MD;  Location: [facility] ENDOSCOPY;  Service: Endoscopy;  Laterality: N/A;   EYE SURGERY Left    cornea transplant, Dr. Dellie Burns   TRIGGER FINGER RELEASE Bilateral    TUBAL LIGATION      Home Medications:  Allergies as of 05/11/2023       Reactions   Penicillins         Medication List        Accurate as of May 11, 2023  3:26 PM. If you have any questions, ask your nurse or doctor.          albuterol 108 (90 Base) MCG/ACT inhaler Commonly known as: VENTOLIN HFA SMARTSIG:2 Inhalation Via Inhaler Every 6 Hours PRN   Alpha Lipoic Acid 200 MG Caps Take 200 mg by mouth in the morning and at bedtime.   amLODipine 10 MG tablet Commonly known as: NORVASC TAKE 1 TABLET BY MOUTH EVERY DAY   ascorbic acid 500 MG tablet Commonly known as: VITAMIN C Take 500 mg by mouth daily.   CALCIUM 600 PO Take by mouth daily.   citalopram 10 MG tablet Commonly known as: CELEXA TAKE 1 TABLET  BY MOUTH EVERY DAY   losartan 25 MG tablet Commonly known as: COZAAR TAKE 1/2 TABLET BY MOUTH DAILY   Nicotinamide 750-27-2-0.5 MG Tabs Take 500 mg by mouth 2 (two) times daily.   Otezla 30 MG Tabs Generic drug: Apremilast Take 1 tablet by mouth 2 (two) times daily.   prednisoLONE acetate 1 % ophthalmic suspension Commonly known as: PRED FORTE 1 drop 4 (four) times daily.   psyllium 58.6 % packet Commonly known as: METAMUCIL Take 1 packet by mouth daily.   triamcinolone cream 0.1 % Commonly known as: KENALOG Apply 1 application topically 2 (two) times daily.   Vitamin D3 25 MCG (1000 UT) Caps Take by mouth.   WOMENS MULTIVITAMIN PLUS PO Take by mouth.        Allergies:  Allergies  Allergen Reactions   Penicillins     Family  History: Family History  Problem Relation Age of Onset   Hypertension Maternal Aunt    Aneurysm Maternal Aunt    Kidney failure Maternal Aunt    Diabetes Maternal Grandmother    Heart disease Maternal Grandmother    Hypertension Maternal Grandmother    Diabetes Paternal Grandmother    Heart disease Paternal Grandmother    Hypertension Paternal Grandmother    Kidney cancer Cousin    Bladder Cancer Neg Hx     Social History:  reports that she has never smoked. She has never used smokeless tobacco. She reports that she does not drink alcohol and does not use drugs.  ROS: For pertinent review of systems please refer to history of present illness  Physical Exam: BP (!) 122/55 (BP Location: Left Arm, Patient Position: Sitting, Cuff Size: Normal)   Pulse 69   Ht 5\' 4"  (1.626 m)   Wt 121 lb 9.6 oz (55.2 kg)   SpO2 99%   BMI 20.87 kg/m   Constitutional:  Well nourished. Alert and oriented, No acute distress. HEENT: [place] AT, moist mucus membranes.  Trachea midline, no masses. Cardiovascular: No clubbing, cyanosis, or edema. Respiratory: Normal respiratory effort, no increased work of breathing. Neurologic: Grossly intact, no focal deficits, moving all 4 extremities. Psychiatric: Normal mood and affect.    Laboratory Data: BMET    Component Value Date/Time   NA 141 10/10/2022 1422   K 3.9 10/10/2022 1422   CL 102 10/10/2022 1422   CO2 25 10/10/2022 1422   GLUCOSE 115 (H) 10/10/2022 1422   BUN 21 10/10/2022 1422   BUN 16 01/29/2016 1444   CREATININE 0.86 10/10/2022 1422   CALCIUM 9.7 10/10/2022 1422   GFRNONAA >60 07/20/2016 1514    CBC    Component Value Date/Time   WBC 9.4 10/10/2022 1422   RBC 4.28 10/10/2022 1422   HGB 12.8 10/10/2022 1422   HCT 38.6 10/10/2022 1422   PLT 276 10/10/2022 1422   MCV 90.2 10/10/2022 1422   MCH 29.9 10/10/2022 1422   MCHC 33.2 10/10/2022 1422   RDW 12.6 10/10/2022 1422   LYMPHSABS 1.9 08/27/2015 0844   MONOABS 0.8 08/27/2015 0844    EOSABS 0.1 08/27/2015 0844   BASOSABS 0.0 08/27/2015 0844    Component     Latest Ref Rng 10/10/2022  Hemoglobin A1C     <5.7 % of total Hgb 6.1 (H)   Mean Plasma Glucose     mg/dL 578   eAG (mmol/L)     mmol/L 7.1     Legend: (H) High  Urinalysis See HPI and Epic I have reviewed the labs.  Pertinent Imaging: KUB with a 6 mm proximal right ureteral stone, Radiologist interpretation still pending I have independently reviewed the films.  See HPI.   Assessment & Plan:    1. Right renal stone -We discussed various treatment options for urolithiasis including observation with or without medical expulsive therapy, shockwave lithotripsy (SWL), ureteroscopy and laser lithotripsy with stent placement. -We discussed that management is based on stone size, location, density, patient co-morbidities, and patient preference.  -Stones <90mm in size have a >80% spontaneous passage rate. Data surrounding the use of tamsulosin for medical expulsive therapy is controversial, but meta analyses suggests it is most efficacious for distal stones between 5-65mm in size. Possible side effects include dizziness/lightheadedness  -SWL has a lower stone free rate in a single procedure, but also a lower complication rate compared to ureteroscopy and avoids a stent and associated stent related symptoms. Possible complications include renal hematoma, steinstrasse, and need for additional treatment. -Ureteroscopy with laser lithotripsy and stent placement has a higher stone free rate than SWL in a single procedure, however increased complication rate including possible infection, ureteral injury, bleeding, and stent related morbidity. Common stent related symptoms include dysuria, urgency/frequency, and flank pain. -she would like to have ESWL -Explained to the patient that with ESWL, shock waves are focused on the stone using X-rays to pinpoint the stone.  The shock waves are fired repeatedly which usually causes the  stone to break into small pieces which pass out in the urine over the next few weeks.  They will go home that day and may be able to resume normal activities in three days.  They should be given a strainer and they need to collect any fragments/sediments that they pass for analysis.   Risks involved with the procedure consist of bruising of the skin and kidney, possible long-term kidney damage, development of HTN, damage to the bowel, spleen, liver, pancreas, female organs or lungs, hematuria, hematuria serious enough to require transfusion or surgical repair, formation of a hematoma, infection or sepsis.  If the stone is too large or dense, it may not break apart or break apart in large pieces and cause "Steinstrasse."  If this happens, it would result in the need for another procedure (likely URS/LL/stent placement).  IV sedation is typically used, but in rare instances general anesthesia is used with the risk of irregular heart beat, irregular BP, stroke, MI, CVA, paralysis, coma and/or death -Schedule right ESWL   -UA w/ micro heme, no signs of infection  -Urine culture pending    2. High risk hematuria -non-smoker -work up in 2017 - nephrolithiasis -cysto 2021 - NED -reports of gross heme -UA w/ micro heme, but likely secondary to her migrating stone -Will reassess when she completes definitive treatment of said stone   Return for Right ESWL .  These notes generated with voice recognition software. I apologize for typographical errors.  Michiel Cowboy, PA-C  [facility] [address] [place], [place] [address] [phone]    ESWL ORDER FORM  Expected date of procedure: 05/14/2023   Surgeon: Irineo Axon, MD  Post op standing: 2-4wk follow up w/KUB prior  Anticoagulation/Aspirin/NSAID standing order: Hold all 72 hours prior  Anesthesia standing order: MAC  VTE standing: SCD's  Dx: Right Ureteral Stone  Procedure: right  Extracorporeal shock wave lithotripsy  CPT : 50590  Standing Order Set:   *NPO after mn, KUB  *NS 118ml/hr, Cipro 500mg  PO, Benadryl 25mg  PO, Valium 10mg  PO, Zofran  4mg  IV  Medications if other than standing orders:   NONE

## 2023-05-12 ENCOUNTER — Other Ambulatory Visit: Payer: Self-pay

## 2023-05-12 DIAGNOSIS — N201 Calculus of ureter: Secondary | ICD-10-CM

## 2023-05-12 LAB — URINE CULTURE: Culture: NO GROWTH

## 2023-05-12 NOTE — Progress Notes (Signed)
 ESWL ORDER FORM  Expected date of procedure: 05/14/2023   Surgeon: Irineo Axon, MD  Post op standing: 2-4wk follow up w/KUB prior  Anticoagulation/Aspirin/NSAID standing order: Hold all 72 hours prior  Anesthesia standing order: MAC  VTE standing: SCD's  Dx: Right Ureteral Stone  Procedure: right Extracorporeal shock wave lithotripsy  CPT : 50590  Standing Order Set:   *NPO after mn, KUB  *NS 12ml/hr, Cipro 500mg  PO, Benadryl 25mg  PO, Valium 10mg  PO, Zofran 4mg  IV  Medications if other than standing orders:   NONE

## 2023-05-12 NOTE — Progress Notes (Signed)
    [facility] ESWL POSTING SHEET        Patient Name: Molly Gregory  DOB: October 11, 1950  MRN: [id number]  Surgeon:  Irineo Axon, MD  Diagnosis:  Right Ureteral Stone  CPT: 95621  ESWL DATE: 05/14/2023  ESWL TIME: 0730  Special Needs/Requirements: No       Cardiac/Medical/Pulmonary Clearance needed: no       Form Faxed to [facility]- [phone] Date:   Date: 05/12/23       Form Faxed to [facility]- [phone]  Date:  Date: 05/12/23           Copy Made for Insurance PA:  Date: 05/12/23       Orders Entered in to Epic:  Date: 05/12/23

## 2023-05-13 MED ORDER — ONDANSETRON HCL 4 MG/2ML IJ SOLN
4.0000 mg | Freq: Once | INTRAMUSCULAR | Status: AC
Start: 2023-05-13 — End: 2023-05-14
  Administered 2023-05-14: 4 mg via INTRAVENOUS

## 2023-05-13 MED ORDER — DIAZEPAM 5 MG PO TABS
10.0000 mg | ORAL_TABLET | ORAL | Status: AC
Start: 2023-05-13 — End: 2023-05-14
  Administered 2023-05-14: 10 mg via ORAL

## 2023-05-13 MED ORDER — DIPHENHYDRAMINE HCL 25 MG PO CAPS
25.0000 mg | ORAL_CAPSULE | ORAL | Status: AC
  Administered 2023-05-14: 25 mg via ORAL

## 2023-05-13 MED ORDER — SODIUM CHLORIDE 0.9 % IV SOLN: INTRAVENOUS | Status: DC

## 2023-05-13 MED ORDER — CEPHALEXIN 500 MG PO CAPS
500.0000 mg | ORAL_CAPSULE | Freq: Once | ORAL | Status: AC
Start: 2023-05-13 — End: 2023-05-14
  Administered 2023-05-14: 500 mg via ORAL

## 2023-05-14 ENCOUNTER — Other Ambulatory Visit: Payer: Self-pay

## 2023-05-14 ENCOUNTER — Encounter
Admission: RE | Disposition: A | Discharge status: Home / Self Care | Payer: Self-pay | Source: Home / Self Care | Attending: Urology

## 2023-05-14 ENCOUNTER — Encounter: Payer: Self-pay | Admitting: Urology

## 2023-05-14 ENCOUNTER — Ambulatory Visit

## 2023-05-14 ENCOUNTER — Ambulatory Visit
Admission: RE | Admit: 2023-05-14 | Discharge: 2023-05-14 | Disposition: A | Discharge status: Home / Self Care | Attending: Urology | Admitting: Urology

## 2023-05-14 DIAGNOSIS — N200 Calculus of kidney: Secondary | ICD-10-CM | POA: Insufficient documentation

## 2023-05-14 DIAGNOSIS — E119 Type 2 diabetes mellitus without complications: Secondary | ICD-10-CM | POA: Insufficient documentation

## 2023-05-14 DIAGNOSIS — N201 Calculus of ureter: Secondary | ICD-10-CM | POA: Diagnosis not present

## 2023-05-14 DIAGNOSIS — K8020 Calculus of gallbladder without cholecystitis without obstruction: Secondary | ICD-10-CM | POA: Diagnosis not present

## 2023-05-14 DIAGNOSIS — I878 Other specified disorders of veins: Secondary | ICD-10-CM | POA: Diagnosis not present

## 2023-05-14 DIAGNOSIS — I10 Essential (primary) hypertension: Secondary | ICD-10-CM | POA: Diagnosis not present

## 2023-05-14 DIAGNOSIS — I499 Cardiac arrhythmia, unspecified: Secondary | ICD-10-CM | POA: Diagnosis not present

## 2023-05-14 HISTORY — PX: EXTRACORPOREAL SHOCK WAVE LITHOTRIPSY: SHX1557

## 2023-05-14 SURGERY — LITHOTRIPSY, ESWL
Anesthesia: Moderate Sedation | Laterality: Right

## 2023-05-14 MED ORDER — HYDROCODONE-ACETAMINOPHEN 5-325 MG PO TABS: 1.0000 | ORAL_TABLET | Freq: Four times a day (QID) | ORAL | 0 refills | Status: DC | PRN

## 2023-05-14 MED ORDER — ONDANSETRON HCL 4 MG/2ML IJ SOLN
INTRAMUSCULAR | Status: AC
  Filled 2023-05-14: qty 2

## 2023-05-14 MED ORDER — DIAZEPAM 5 MG PO TABS
ORAL_TABLET | ORAL | Status: AC
  Filled 2023-05-14: qty 2

## 2023-05-14 MED ORDER — DIPHENHYDRAMINE HCL 25 MG PO CAPS
ORAL_CAPSULE | ORAL | Status: AC
  Filled 2023-05-14: qty 1

## 2023-05-14 MED ORDER — CEPHALEXIN 500 MG PO CAPS
ORAL_CAPSULE | ORAL | Status: AC
  Filled 2023-05-14: qty 1

## 2023-05-14 MED ORDER — TAMSULOSIN HCL 0.4 MG PO CAPS: 0.4000 mg | ORAL_CAPSULE | Freq: Every day | ORAL | 0 refills | Status: DC

## 2023-05-14 NOTE — Discharge Instructions (Addendum)
 As per the [facility] discharge instructions A prescription for pain medication was sent to your pharmacy A prescription for tamsulosin which will help you pass stone fragments was also sent to your pharmacy Call [facility] at [phone] for pain not controlled with oral medications or fever greater than 101 degrees Our office will contact you for a postop follow-up appointment

## 2023-05-14 NOTE — Interval H&P Note (Signed)
 History and Physical Interval Note:  05/14/2023 8:30 AM  Molly Gregory  has presented today for surgery, with the diagnosis of Right Ureteral Stone.  The various methods of treatment have been discussed with the patient and family. After consideration of risks, benefits and other options for treatment, the patient has consented to  Procedure(s): LITHOTRIPSY, ESWL (Right) as a surgical intervention.  The patient's history has been reviewed, patient examined, no change in status, stable for surgery.  I have reviewed the patient's chart and labs.  Questions were answered to the patient's satisfaction.    CV:RRR Lungs:clear  Riki Altes

## 2023-05-15 ENCOUNTER — Other Ambulatory Visit: Payer: Self-pay

## 2023-05-15 ENCOUNTER — Encounter: Payer: Self-pay | Admitting: Urology

## 2023-05-15 DIAGNOSIS — N201 Calculus of ureter: Secondary | ICD-10-CM

## 2023-05-25 DIAGNOSIS — Z1231 Encounter for screening mammogram for malignant neoplasm of breast: Secondary | ICD-10-CM | POA: Diagnosis not present

## 2023-05-25 LAB — HM MAMMOGRAPHY

## 2023-05-26 ENCOUNTER — Other Ambulatory Visit: Payer: Self-pay

## 2023-05-26 MED ORDER — CITALOPRAM HYDROBROMIDE 10 MG PO TABS: 10.0000 mg | ORAL_TABLET | Freq: Every day | ORAL | 0 refills | Status: DC

## 2023-06-01 NOTE — Progress Notes (Unsigned)
 03/16/2018  9:56 AM   Molly Gregory [birth year] [id number]  Referring provider: Dana Allan, MD [address] [place],  [place] [address]  Urological history: 1. High risk hematuria -non-smoker -CTU 2017 bilateral nephrolithiasis -cysto 2017 NED -non-contrast CT 2021 - right renal stone -cysto 2021 - NED  2. Right renal stone -non-contrast CT 2021 -right renal stone -KUB (11/2021) 5 mm right renal stone  Chief Complaint  Patient presents with   Follow-up    HPI: Molly Gregory is a 73 y.o. female who presents today for post procedural visit.    Previous records reviewed.     At her visit on 05/11/2023, She had a sudden onset of gross hematuria through the weekend.  She showed me photographs of the urine in the toilet and it was a very translucent red.  She has had some lower back pain, but she states it may be due to her osteoporosis.  Patient denies any modifying or aggravating factors.  Patient denies any recent UTI's, dysuria or suprapubic/flank pain.  Patient denies any fevers, chills, nausea or vomiting.   UA with hazy, specific gravity 1.015, pH 5.5, large heme, small bilirubin, trace ketone, greater than 300 protein, greater than 50 RBCs, few bacteria and calcium oxalate crystals present.  KUB demonstrates that the right lower pole renal stone has migrated into the proximal right ureter  She underwent ESWL on 05/14/2023 with Dr. Lonna Cobb.    KUB there are some tiny fragments in the right mid ureter.    UA yellow clear, specific gravity 1.015, trace heme, pH 5.5, 0-5 WBCs, 0-2 RBCs, 0-10 epithelial cells and a few bacteria.  She brings in some fragments she has passed to be sent off for analysis.  She feels well today.  Patient denies any modifying or aggravating factors.  Patient denies any recent UTI's, gross hematuria, dysuria or suprapubic/flank pain.  Patient denies any fevers, chills, nausea or vomiting.    PMH: Past Medical History:  Diagnosis Date    Allergy    Anxiety    Arthritis    Diverticulosis    H/O exercise stress test    normal   H/O psoriasis    Head injury, closed 2013   Hiatal hernia    History of chicken pox    Hx of acute bronchitis    Hx of migraines    Hyperlipidemia    Hypertension    Irregular heart beat    Kidney stone     Surgical History: Past Surgical History:  Procedure Laterality Date   CARPAL TUNNEL RELEASE Bilateral    both hands, Dr. Tad Moore   CATARACT EXTRACTION Left    COLONOSCOPY WITH PROPOFOL N/A 04/24/2020   Procedure: COLONOSCOPY WITH PROPOFOL;  Surgeon: Midge Minium, MD;  Location: [facility] ENDOSCOPY;  Service: Endoscopy;  Laterality: N/A;  COVID POSITIVE ON 04/09/2020 - ASYMPTOMATIC   COLONOSCOPY WITH PROPOFOL N/A 04/23/2021   Procedure: COLONOSCOPY WITH PROPOFOL;  Surgeon: Midge Minium, MD;  Location: [facility] ENDOSCOPY;  Service: Endoscopy;  Laterality: N/A;   EXTRACORPOREAL SHOCK WAVE LITHOTRIPSY Right 05/14/2023   Procedure: LITHOTRIPSY, ESWL;  Surgeon: Riki Altes, MD;  Location: [facility] ORS;  Service: Urology;  Laterality: Right;   EYE SURGERY Left    cornea transplant, Dr. Dellie Burns   TRIGGER FINGER RELEASE Bilateral    TUBAL LIGATION      Home Medications:  Allergies as of 06/03/2023       Reactions   Penicillins         Medication  List        Accurate as of June 03, 2023  9:56 AM. If you have any questions, ask your nurse or doctor.          STOP taking these medications    HYDROcodone-acetaminophen 5-325 MG tablet Commonly known as: NORCO/VICODIN   tamsulosin 0.4 MG Caps capsule Commonly known as: FLOMAX       TAKE these medications    albuterol 108 (90 Base) MCG/ACT inhaler Commonly known as: VENTOLIN HFA SMARTSIG:2 Inhalation Via Inhaler Every 6 Hours PRN   Alpha Lipoic Acid 200 MG Caps Take 200 mg by mouth in the morning and at bedtime.   amLODipine 10 MG tablet Commonly known as: NORVASC TAKE 1 TABLET BY MOUTH EVERY DAY   ascorbic acid 500 MG  tablet Commonly known as: VITAMIN C Take 500 mg by mouth daily.   CALCIUM 600 PO Take by mouth daily.   citalopram 10 MG tablet Commonly known as: CELEXA Take 1 tablet (10 mg total) by mouth daily.   losartan 25 MG tablet Commonly known as: COZAAR TAKE 1/2 TABLET BY MOUTH DAILY   Nicotinamide 750-27-2-0.5 MG Tabs Take 500 mg by mouth 2 (two) times daily.   Otezla 30 MG Tabs Generic drug: Apremilast Take 1 tablet by mouth 2 (two) times daily.   prednisoLONE acetate 1 % ophthalmic suspension Commonly known as: PRED FORTE 1 drop 4 (four) times daily.   psyllium 58.6 % packet Commonly known as: METAMUCIL Take 1 packet by mouth daily.   triamcinolone cream 0.1 % Commonly known as: KENALOG Apply 1 application topically 2 (two) times daily.   Vitamin D3 25 MCG (1000 UT) Caps Take by mouth.   WOMENS MULTIVITAMIN PLUS PO Take by mouth.        Allergies:  Allergies  Allergen Reactions   Penicillins     Family History: Family History  Problem Relation Age of Onset   Hypertension Maternal Aunt    Aneurysm Maternal Aunt    Kidney failure Maternal Aunt    Diabetes Maternal Grandmother    Heart disease Maternal Grandmother    Hypertension Maternal Grandmother    Diabetes Paternal Grandmother    Heart disease Paternal Grandmother    Hypertension Paternal Grandmother    Kidney cancer Cousin    Bladder Cancer Neg Hx     Social History:  reports that she has never smoked. She has never used smokeless tobacco. She reports that she does not drink alcohol and does not use drugs.  ROS: For pertinent review of systems please refer to history of present illness  Physical Exam: BP (!) 145/63   Pulse 66   Constitutional:  Well nourished. Alert and oriented, No acute distress. HEENT:  AT, moist mucus membranes.  Trachea midline Cardiovascular: No clubbing, cyanosis, or edema. Respiratory: Normal respiratory effort, no increased work of breathing. Neurologic:  Grossly intact, no focal deficits, moving all 4 extremities. Psychiatric: Normal mood and affect.    Laboratory Data: Urinalysis See HPI and Epic I have reviewed the labs.   Pertinent Imaging: KUB small fragments in the mid right ureter, radiologist interpretation pending I have independently reviewed the films.  See HPI.   Assessment & Plan:    1. Right renal stone -KUB small fragments remaining in mid ureter -Fragments will be sent for analysis    2. High risk hematuria -non-smoker -work up in 2017 - nephrolithiasis -cysto 2021 - NED -reports of gross heme -UA negative for micro heme   Return  in about 1 month (around 07/03/2023) for KUB and office visit .  These notes generated with voice recognition software. I apologize for typographical errors.  Cloretta Ned  [facility] [address] [place], [place] [address] [phone]

## 2023-06-03 ENCOUNTER — Ambulatory Visit
Admission: RE | Admit: 2023-06-03 | Discharge: 2023-06-03 | Disposition: A | Discharge status: Home / Self Care | Attending: Urology | Admitting: Urology

## 2023-06-03 ENCOUNTER — Ambulatory Visit (INDEPENDENT_AMBULATORY_CARE_PROVIDER_SITE_OTHER): Admitting: Urology

## 2023-06-03 ENCOUNTER — Ambulatory Visit
Admission: RE | Admit: 2023-06-03 | Discharge: 2023-06-03 | Disposition: A | Discharge status: Home / Self Care | Source: Ambulatory Visit | Attending: Urology | Admitting: Urology

## 2023-06-03 ENCOUNTER — Other Ambulatory Visit: Payer: Self-pay

## 2023-06-03 VITALS — BP 145/63 | HR 66

## 2023-06-03 DIAGNOSIS — R310 Gross hematuria: Secondary | ICD-10-CM

## 2023-06-03 DIAGNOSIS — N201 Calculus of ureter: Secondary | ICD-10-CM

## 2023-06-03 DIAGNOSIS — K8020 Calculus of gallbladder without cholecystitis without obstruction: Secondary | ICD-10-CM | POA: Diagnosis not present

## 2023-06-03 LAB — URINALYSIS, COMPLETE
Bilirubin, UA: NEGATIVE
Glucose, UA: NEGATIVE
Ketones, UA: NEGATIVE
Leukocytes,UA: NEGATIVE
Nitrite, UA: NEGATIVE
Protein,UA: NEGATIVE
Specific Gravity, UA: 1.015 (ref 1.005–1.030)
Urobilinogen, Ur: 0.2 mg/dL (ref 0.2–1.0)
pH, UA: 5.5 (ref 5.0–7.5)

## 2023-06-03 LAB — MICROSCOPIC EXAMINATION

## 2023-06-03 NOTE — Addendum Note (Signed)
 Addended byRanda Lynn on: 06/03/2023 10:00 AM   Modules accepted: Orders

## 2023-06-12 LAB — CALCULI, WITH PHOTOGRAPH (CLINICAL LAB)
Calcium Oxalate Dihydrate: 30 %
Calcium Oxalate Monohydrate: 65 %
Hydroxyapatite: 5 %
Weight Calculi: 17 mg

## 2023-06-18 ENCOUNTER — Other Ambulatory Visit: Payer: Self-pay | Admitting: Internal Medicine

## 2023-06-19 ENCOUNTER — Other Ambulatory Visit: Payer: Self-pay | Admitting: Family Medicine

## 2023-06-30 ENCOUNTER — Telehealth: Payer: Self-pay | Admitting: Family Medicine

## 2023-06-30 ENCOUNTER — Other Ambulatory Visit: Payer: Self-pay | Admitting: Urology

## 2023-06-30 DIAGNOSIS — N201 Calculus of ureter: Secondary | ICD-10-CM

## 2023-06-30 NOTE — Telephone Encounter (Signed)
 Patient need lab orders.

## 2023-07-01 ENCOUNTER — Ambulatory Visit
Admission: RE | Admit: 2023-07-01 | Discharge: 2023-07-01 | Disposition: A | Discharge status: Home / Self Care | Source: Ambulatory Visit | Attending: Urology | Admitting: Urology

## 2023-07-01 ENCOUNTER — Encounter
Admission: RE | Admit: 2023-07-01 | Discharge: 2023-07-01 | Disposition: A | Discharge status: Home / Self Care | Source: Home / Self Care | Attending: Urology | Admitting: Urology

## 2023-07-01 ENCOUNTER — Ambulatory Visit (INDEPENDENT_AMBULATORY_CARE_PROVIDER_SITE_OTHER): Admitting: Urology

## 2023-07-01 ENCOUNTER — Encounter: Payer: Self-pay | Admitting: Urology

## 2023-07-01 VITALS — BP 153/66 | HR 62 | Ht 63.0 in | Wt 120.0 lb

## 2023-07-01 DIAGNOSIS — N201 Calculus of ureter: Secondary | ICD-10-CM | POA: Diagnosis not present

## 2023-07-01 DIAGNOSIS — Z87442 Personal history of urinary calculi: Secondary | ICD-10-CM | POA: Diagnosis not present

## 2023-07-01 DIAGNOSIS — K5900 Constipation, unspecified: Secondary | ICD-10-CM | POA: Diagnosis not present

## 2023-07-01 DIAGNOSIS — K8020 Calculus of gallbladder without cholecystitis without obstruction: Secondary | ICD-10-CM | POA: Diagnosis not present

## 2023-07-01 NOTE — Progress Notes (Signed)
 07/01/2023 9:40 AM   Molly Gregory December 31, 1950 [id number]  Referring provider: Valli Gaw, MD [address] [place],  [place] [address]  Urological history: 1.  High risk hematuria - Non-smoker - CTU (2017) -bilateral nephrolithiasis - cysto (2017) - NED  2.  Nephrolithiasis - non-contrast CT (2021) -5 mm right renal stone  Chief Complaint  Patient presents with   Follow-up   HPI: Molly Gregory is a 73 y.o. woman who presents today for 1 month follow-up for right renal stone.  Previous records reviewed.   She underwent ESWL on March 13 for a right ureteral stone.  She has passed fragments, but stone were still visible on the KUB on June 03, 2023.  She presents today for repeat KUB.    Today's KUB, a less than 2 mm calcification is at the level of L3 on the right.   Patient denies any modifying or aggravating factors.  Patient denies any recent UTI's, gross hematuria, dysuria or suprapubic/flank pain.  Patient denies any fevers, chills, nausea or vomiting.     PMH: Past Medical History:  Diagnosis Date   Allergy    Anxiety    Arthritis    Diverticulosis    H/O exercise stress test    normal   H/O psoriasis    Head injury, closed 2013   Hiatal hernia    History of chicken pox    Hx of acute bronchitis    Hx of migraines    Hyperlipidemia    Hypertension    Irregular heart beat    Kidney stone     Surgical History: Past Surgical History:  Procedure Laterality Date   CARPAL TUNNEL RELEASE Bilateral    both hands, Dr. Arnette Lansing   CATARACT EXTRACTION Left    COLONOSCOPY WITH PROPOFOL  N/A 04/24/2020   Procedure: COLONOSCOPY WITH PROPOFOL ;  Surgeon: Marnee Sink, MD;  Location: [facility] ENDOSCOPY;  Service: Endoscopy;  Laterality: N/A;  COVID POSITIVE ON 04/09/2020 - ASYMPTOMATIC   COLONOSCOPY WITH PROPOFOL  N/A 04/23/2021   Procedure: COLONOSCOPY WITH PROPOFOL ;  Surgeon: Marnee Sink, MD;  Location: [facility] ENDOSCOPY;  Service: Endoscopy;  Laterality: N/A;    EXTRACORPOREAL SHOCK WAVE LITHOTRIPSY Right 05/14/2023   Procedure: LITHOTRIPSY, ESWL;  Surgeon: Geraline Knapp, MD;  Location: [facility] ORS;  Service: Urology;  Laterality: Right;   EYE SURGERY Left    cornea transplant, Dr. Dingeldein   TRIGGER FINGER RELEASE Bilateral    TUBAL LIGATION      Home Medications:  Allergies as of 07/01/2023       Reactions   Penicillins         Medication List        Accurate as of July 01, 2023  9:40 AM. If you have any questions, ask your nurse or doctor.          albuterol  108 (90 Base) MCG/ACT inhaler Commonly known as: VENTOLIN  HFA SMARTSIG:2 Inhalation Via Inhaler Every 6 Hours PRN   Alpha Lipoic Acid 200 MG Caps Take 200 mg by mouth in the morning and at bedtime.   amLODipine  10 MG tablet Commonly known as: NORVASC  TAKE 1 TABLET BY MOUTH EVERY DAY   ascorbic acid 500 MG tablet Commonly known as: VITAMIN C Take 500 mg by mouth daily.   CALCIUM  600 PO Take by mouth daily.   citalopram  10 MG tablet Commonly known as: CELEXA  TAKE 1 TABLET BY MOUTH EVERY DAY   losartan  25 MG tablet Commonly known as: COZAAR  TAKE 1/2 TABLET BY  MOUTH DAILY   Nicotinamide 750-27-2-0.5 MG Tabs Take 500 mg by mouth 2 (two) times daily.   Otezla  30 MG Tabs Generic drug: Apremilast  Take 1 tablet by mouth 2 (two) times daily.   prednisoLONE acetate 1 % ophthalmic suspension Commonly known as: PRED FORTE 1 drop 4 (four) times daily.   psyllium 58.6 % packet Commonly known as: METAMUCIL Take 1 packet by mouth daily.   triamcinolone cream 0.1 % Commonly known as: KENALOG Apply 1 application topically 2 (two) times daily.   Vitamin D3 25 MCG (1000 UT) Caps Take by mouth.   WOMENS MULTIVITAMIN PLUS PO Take by mouth.        Allergies:  Allergies  Allergen Reactions   Penicillins     Family History: Family History  Problem Relation Age of Onset   Hypertension Maternal Aunt    Aneurysm Maternal Aunt    Kidney failure Maternal  Aunt    Diabetes Maternal Grandmother    Heart disease Maternal Grandmother    Hypertension Maternal Grandmother    Diabetes Paternal Grandmother    Heart disease Paternal Grandmother    Hypertension Paternal Grandmother    Kidney cancer Cousin    Bladder Cancer Neg Hx     Social History:  reports that she has never smoked. She has never used smokeless tobacco. She reports that she does not drink alcohol and does not use drugs.  ROS: Pertinent ROS in HPI  Physical Exam: BP (!) 153/66   Pulse 62   Ht 5\' 3"  (1.6 m)   Wt 120 lb (54.4 kg)   BMI 21.26 kg/m   Constitutional:  Well nourished. Alert and oriented, No acute distress. HEENT: [place] AT, moist mucus membranes.  Trachea midline Cardiovascular: No clubbing, cyanosis, or edema. Respiratory: Normal respiratory effort, no increased work of breathing. Neurologic: Grossly intact, no focal deficits, moving all 4 extremities. Psychiatric: Normal mood and affect.  Laboratory Data: N/A  Pertinent Imaging: KUB less than 2 mm stone located in the mid right ureter, radiologist interpretation still pending I have independently reviewed the films.  See HPI  Assessment & Plan:    1.  Right ureteral stone -I suggested to repeat the KUB in 3 months, but she was hesitant, I explained that I would like to make sure that all fragments are cleared from the urinary tract as fragments that are left behind due at the potential to either continue to cause blockage of urine and in turn damage to the kidney and the fragment also can attract other crystals and become larger over time and thus causing issues in the future, she was then agreeable and will return in 3 months for follow-up KUB, she will continue to strain her urine and if she should pass the fragments she will let us  know and we will cancel the KUB appointment  Return in about 3 months (around 09/30/2023) for KUB and symptom recheck .  These notes generated with voice recognition software.  I apologize for typographical errors.  Briant Camper  [facility] [address]  [address] [place], [place] [address] [phone]

## 2023-07-06 ENCOUNTER — Other Ambulatory Visit (INDEPENDENT_AMBULATORY_CARE_PROVIDER_SITE_OTHER): Payer: Medicare Other

## 2023-07-06 ENCOUNTER — Telehealth: Payer: Self-pay

## 2023-07-06 DIAGNOSIS — E785 Hyperlipidemia, unspecified: Secondary | ICD-10-CM

## 2023-07-06 DIAGNOSIS — I951 Orthostatic hypotension: Secondary | ICD-10-CM | POA: Diagnosis not present

## 2023-07-06 DIAGNOSIS — R7303 Prediabetes: Secondary | ICD-10-CM

## 2023-07-06 DIAGNOSIS — E539 Vitamin B deficiency, unspecified: Secondary | ICD-10-CM

## 2023-07-06 DIAGNOSIS — E559 Vitamin D deficiency, unspecified: Secondary | ICD-10-CM

## 2023-07-06 NOTE — Telephone Encounter (Signed)
 Error

## 2023-07-06 NOTE — Addendum Note (Signed)
 Addended by: Barb Levers on: 07/06/2023 08:34 AM   Modules accepted: Orders

## 2023-07-06 NOTE — Addendum Note (Signed)
 Addended by: Barb Levers on: 07/06/2023 08:47 AM   Modules accepted: Orders

## 2023-07-07 LAB — BASIC METABOLIC PANEL WITH GFR
BUN: 16 mg/dL (ref 7–25)
CO2: 28 mmol/L (ref 20–32)
Calcium: 9.8 mg/dL (ref 8.6–10.4)
Chloride: 103 mmol/L (ref 98–110)
Creat: 0.76 mg/dL (ref 0.60–1.00)
Glucose, Bld: 109 mg/dL — ABNORMAL HIGH (ref 65–99)
Potassium: 4.2 mmol/L (ref 3.5–5.3)
Sodium: 142 mmol/L (ref 135–146)
eGFR: 82 mL/min/{1.73_m2} (ref >=60)

## 2023-07-14 ENCOUNTER — Ambulatory Visit: Payer: Self-pay | Admitting: Family Medicine

## 2023-07-14 ENCOUNTER — Ambulatory Visit (INDEPENDENT_AMBULATORY_CARE_PROVIDER_SITE_OTHER): Payer: Medicare Other | Admitting: Family Medicine

## 2023-07-14 ENCOUNTER — Encounter: Payer: Self-pay | Admitting: Family Medicine

## 2023-07-14 VITALS — BP 130/62 | HR 64 | Temp 98.3°F | Resp 20 | Ht 63.0 in | Wt 119.5 lb

## 2023-07-14 DIAGNOSIS — F39 Unspecified mood [affective] disorder: Secondary | ICD-10-CM

## 2023-07-14 DIAGNOSIS — R7309 Other abnormal glucose: Secondary | ICD-10-CM

## 2023-07-14 DIAGNOSIS — E539 Vitamin B deficiency, unspecified: Secondary | ICD-10-CM | POA: Diagnosis not present

## 2023-07-14 DIAGNOSIS — I10 Essential (primary) hypertension: Secondary | ICD-10-CM

## 2023-07-14 DIAGNOSIS — Z1159 Encounter for screening for other viral diseases: Secondary | ICD-10-CM

## 2023-07-14 DIAGNOSIS — E559 Vitamin D deficiency, unspecified: Secondary | ICD-10-CM

## 2023-07-14 DIAGNOSIS — E785 Hyperlipidemia, unspecified: Secondary | ICD-10-CM | POA: Diagnosis not present

## 2023-07-14 LAB — COMPREHENSIVE METABOLIC PANEL WITH GFR
ALT: 10 U/L (ref 0–35)
AST: 18 U/L (ref 0–37)
Albumin: 4.4 g/dL (ref 3.5–5.2)
Alkaline Phosphatase: 57 U/L (ref 39–117)
BUN: 16 mg/dL (ref 6–23)
CO2: 31 meq/L (ref 19–32)
Calcium: 9.9 mg/dL (ref 8.4–10.5)
Chloride: 102 meq/L (ref 96–112)
Creatinine, Ser: 0.77 mg/dL (ref 0.40–1.20)
GFR: 75.81 mL/min (ref >60.00)
Glucose, Bld: 112 mg/dL — ABNORMAL HIGH (ref 70–99)
Potassium: 3.8 meq/L (ref 3.5–5.1)
Sodium: 144 meq/L (ref 135–145)
Total Bilirubin: 0.9 mg/dL (ref 0.2–1.2)
Total Protein: 7.3 g/dL (ref 6.0–8.3)

## 2023-07-14 LAB — VITAMIN B12: Vitamin B-12: 315 pg/mL (ref 211–911)

## 2023-07-14 LAB — LIPID PANEL
Cholesterol: 188 mg/dL (ref 0–200)
HDL: 84.9 mg/dL (ref >39.00)
LDL Cholesterol: 89 mg/dL (ref 0–99)
NonHDL: 102.95
Total CHOL/HDL Ratio: 2
Triglycerides: 72 mg/dL (ref 0.0–149.0)
VLDL: 14.4 mg/dL (ref 0.0–40.0)

## 2023-07-14 LAB — VITAMIN D 25 HYDROXY (VIT D DEFICIENCY, FRACTURES): VITD: 68.82 ng/mL (ref 30.00–100.00)

## 2023-07-14 LAB — HEMOGLOBIN A1C: Hgb A1c MFr Bld: 5.9 % (ref 4.6–6.5)

## 2023-07-14 MED ORDER — LOSARTAN POTASSIUM 25 MG PO TABS: 12.5000 mg | ORAL_TABLET | Freq: Every day | ORAL | 3 refills | Status: DC

## 2023-07-14 MED ORDER — CITALOPRAM HYDROBROMIDE 10 MG PO TABS: 10.0000 mg | ORAL_TABLET | Freq: Every day | ORAL | 3 refills | Status: DC

## 2023-07-14 MED ORDER — AMLODIPINE BESYLATE 10 MG PO TABS: 10.0000 mg | ORAL_TABLET | Freq: Every day | ORAL | 3 refills | Status: DC

## 2023-07-14 NOTE — Patient Instructions (Addendum)
 It was a pleasure meeting you today. Thank you for allowing me to take part in your health care.  Our goals for today as we discussed include:  We will get some labs today.  If they are abnormal or we need to do something about them, I will call you.  If they are normal, I will send you a message on MyChart (if it is active) or a letter in the mail.  If you don't hear from us  in 2 weeks, please call the office at the number below.   Refills sent for requested medications  Will notify you if needing repeat Colonoscopy  Please see letter from Dr Ole Berkeley from 04/25/2021 for further recommendations.   This is a list of the screening recommended for you and due dates:  Health Maintenance  Topic Date Due   Hepatitis C Screening  Never done   Medicare Annual Wellness Visit  01/03/2016   DTaP/Tdap/Td vaccine (2 - Td or Tdap) 12/16/2021   Colon Cancer Screening  04/23/2022   Flu Shot  10/02/2023   Mammogram  05/24/2024   Pneumonia Vaccine  Completed   DEXA scan (bone density measurement)  Completed   HPV Vaccine  Aged Out   Meningitis B Vaccine  Aged Out   COVID-19 Vaccine  Discontinued   Cologuard (Stool DNA test)  Discontinued   Zoster (Shingles) Vaccine  Discontinued      If you have any questions or concerns, please do not hesitate to call the office at [phone].  I look forward to our next visit and until then take care and stay safe.  Regards,   Valli Gaw, MD   [facility]

## 2023-07-14 NOTE — Progress Notes (Signed)
 SUBJECTIVE:   Chief Complaint  Patient presents with   Hypertension    Follow up   HPI Presents for follow up chronic disease management  Discussed the use of AI scribe software for clinical note transcription with the patient, who gave verbal consent to proceed.  History of Present Illness Molly Gregory is a 73 year old female with hypertension who presents for medication management and follow-up.  She has been experiencing fluctuations in her blood pressure. Initially, it was low, leading to a reduction in her medication, but it subsequently increased again. She is currently taking 10 mg of amlodipine . No chest pain or shortness of breath. She experiences occasional leg swelling, which she associates with high salt intake.  She is prediabetic and usually monitors her A1c and cholesterol levels, although they were not included in her recent blood work. She tries to maintain physical activity by working out for about thirty minutes a day, although not consistently every day.  She recalls an incident approximately seven to eight years ago when she fell and hit her head, resulting in her whole head being 'black and blue,' but she did not suffer a concussion. Her son-in-law, a physical therapist, provided her with a device to help with stress in her back, which she attributes to her neck.  She has beginning stages of osteoporosis, which runs in her family, affecting her grandmother, aunts, and cousin.  She recently underwent a lithotripsy about a month ago. She is currently taking citalopram  and has refills available.  She had a colonoscopy in the past where four polyps were found, and she recalls being told that no further colonoscopies were needed. She is uncertain about the status of her mammogram, believing it might be due in January.     PERTINENT PMH / PSH: As above  OBJECTIVE:  BP 130/62   Pulse 64   Temp 98.3 F (36.8 C)   Resp 20   Ht 5\' 3"  (1.6 m)   Wt 119 lb 8  oz (54.2 kg)   SpO2 99%   BMI 21.17 kg/m    Physical Exam Vitals reviewed.  Constitutional:      General: She is not in acute distress.    Appearance: She is not ill-appearing.  HENT:     Head: Normocephalic.     Right Ear: Tympanic membrane, ear canal and external ear normal.     Left Ear: Tympanic membrane, ear canal and external ear normal.     Nose: Nose normal.     Mouth/Throat:     Mouth: Mucous membranes are moist.  Eyes:     Extraocular Movements: Extraocular movements intact.     Conjunctiva/sclera: Conjunctivae normal.     Pupils: Pupils are equal, round, and reactive to light.  Neck:     Thyroid : No thyromegaly or thyroid  tenderness.     Vascular: No carotid bruit.  Cardiovascular:     Rate and Rhythm: Normal rate and regular rhythm.     Pulses: Normal pulses.     Heart sounds: Normal heart sounds.  Pulmonary:     Effort: Pulmonary effort is normal.     Breath sounds: Normal breath sounds.  Abdominal:     General: Bowel sounds are normal. There is no distension.     Palpations: Abdomen is soft.     Tenderness: There is no abdominal tenderness. There is no right CVA tenderness, left CVA tenderness, guarding or rebound.  Musculoskeletal:        General: Normal  range of motion.     Cervical back: Normal range of motion.     Right lower leg: No edema.     Left lower leg: No edema.  Lymphadenopathy:     Cervical: No cervical adenopathy.  Skin:    Capillary Refill: Capillary refill takes less than 2 seconds.  Neurological:     General: No focal deficit present.     Mental Status: She is alert and oriented to person, place, and time. Mental status is at baseline.     Motor: No weakness.  Psychiatric:        Mood and Affect: Mood normal.        Behavior: Behavior normal.        Thought Content: Thought content normal.        Judgment: Judgment normal.           07/14/2023    9:15 AM 01/13/2023    9:07 AM 10/10/2022    1:10 PM 04/21/2022    9:03 AM  09/11/2021    8:35 AM  Depression screen PHQ 2/9  Decreased Interest 0 0 0 0 0  Down, Depressed, Hopeless 0 0 0 0 0  PHQ - 2 Score 0 0 0 0 0  Altered sleeping 0 0 0 0   Tired, decreased energy 0 0 0 0   Change in appetite 0 0 0 0   Feeling bad or failure about yourself  0 0 0 0   Trouble concentrating 0 0 0 0   Moving slowly or fidgety/restless 0 0 0 0   Suicidal thoughts 0 0 0 0   PHQ-9 Score 0 0 0 0   Difficult doing work/chores Not difficult at all Not difficult at all Not difficult at all Not difficult at all       07/14/2023    9:15 AM 01/13/2023    9:07 AM 10/10/2022    1:10 PM 04/21/2022    9:04 AM  GAD 7 : Generalized Anxiety Score  Nervous, Anxious, on Edge 0 0 0 0  Control/stop worrying 0 0 0 0  Worry too much - different things 0 0 0 0  Trouble relaxing 0 0 0 0  Restless 0 0 0 0  Easily annoyed or irritable 0 0 0 0  Afraid - awful might happen 0 0 0 0  Total GAD 7 Score 0 0 0 0  Anxiety Difficulty Not difficult at all Not difficult at all Not difficult at all Not difficult at all    ASSESSMENT/PLAN:  Essential hypertension, benign Assessment & Plan: Hypertension well-controlled  - Refill amlodipine  10 mg daily. - Refill Losartan  25 mg daily   Orders: -     Losartan  Potassium; Take 0.5 tablets (12.5 mg total) by mouth daily.  Dispense: 45 tablet; Refill: 3 -     amLODIPine  Besylate; Take 1 tablet (10 mg total) by mouth daily.  Dispense: 90 tablet; Refill: 3 -     Comprehensive metabolic panel with GFR  Mood disorder (HCC) Assessment & Plan: Refill Citalopram  10mg  daily  Orders: -     Citalopram  Hydrobromide; Take 1 tablet (10 mg total) by mouth daily.  Dispense: 90 tablet; Refill: 3  Hyperlipidemia, unspecified hyperlipidemia type Assessment & Plan: Check lipids  Orders: -     Lipid panel  Vitamin D  deficiency Assessment & Plan: Check Vitamin D  level  Orders: -     VITAMIN D  25 Hydroxy (Vit-D Deficiency, Fractures)  Vitamin B  deficiency Assessment & Plan:  Check Vitamin B 12 level   Orders: -     Vitamin B12  Abnormal glucose -     Hemoglobin A1c  Need for hepatitis C screening test -     Hepatitis C antibody    PDMP reviewed  Return if symptoms worsen or fail to improve, for PCP.  Valli Gaw, MD

## 2023-07-15 LAB — HEPATITIS C ANTIBODY: Hepatitis C Ab: NONREACTIVE

## 2023-07-19 ENCOUNTER — Encounter: Payer: Self-pay | Admitting: Family Medicine

## 2023-07-19 DIAGNOSIS — Z1159 Encounter for screening for other viral diseases: Secondary | ICD-10-CM | POA: Insufficient documentation

## 2023-07-19 DIAGNOSIS — E559 Vitamin D deficiency, unspecified: Secondary | ICD-10-CM

## 2023-07-19 DIAGNOSIS — F39 Unspecified mood [affective] disorder: Secondary | ICD-10-CM | POA: Insufficient documentation

## 2023-07-19 DIAGNOSIS — R7309 Other abnormal glucose: Secondary | ICD-10-CM | POA: Insufficient documentation

## 2023-07-19 DIAGNOSIS — E539 Vitamin B deficiency, unspecified: Secondary | ICD-10-CM

## 2023-07-19 HISTORY — DX: Vitamin D deficiency, unspecified: E55.9

## 2023-07-19 HISTORY — DX: Vitamin B deficiency, unspecified: E53.9

## 2023-07-19 NOTE — Assessment & Plan Note (Signed)
 Check Vitamin B 12 level

## 2023-07-19 NOTE — Assessment & Plan Note (Signed)
 Check lipids

## 2023-07-19 NOTE — Assessment & Plan Note (Signed)
 Check Vitamin D level

## 2023-07-19 NOTE — Assessment & Plan Note (Addendum)
 Hypertension well-controlled  - Refill amlodipine  10 mg daily. - Refill Losartan  25 mg daily

## 2023-07-19 NOTE — Assessment & Plan Note (Signed)
 Refill Citalopram  10mg  daily

## 2023-09-07 DIAGNOSIS — Z947 Corneal transplant status: Secondary | ICD-10-CM | POA: Diagnosis not present

## 2023-09-07 DIAGNOSIS — E119 Type 2 diabetes mellitus without complications: Secondary | ICD-10-CM | POA: Diagnosis not present

## 2023-10-05 ENCOUNTER — Ambulatory Visit
Admission: RE | Admit: 2023-10-05 | Discharge: 2023-10-05 | Disposition: A | Discharge status: Home / Self Care | Source: Ambulatory Visit | Attending: Urology | Admitting: Urology

## 2023-10-05 ENCOUNTER — Other Ambulatory Visit
Admission: RE | Admit: 2023-10-05 | Discharge: 2023-10-05 | Disposition: A | Discharge status: Home / Self Care | Source: Ambulatory Visit | Attending: Urology | Admitting: Urology

## 2023-10-05 ENCOUNTER — Other Ambulatory Visit: Payer: Self-pay

## 2023-10-05 DIAGNOSIS — N200 Calculus of kidney: Secondary | ICD-10-CM

## 2023-10-05 DIAGNOSIS — K8020 Calculus of gallbladder without cholecystitis without obstruction: Secondary | ICD-10-CM | POA: Diagnosis not present

## 2023-10-05 DIAGNOSIS — I878 Other specified disorders of veins: Secondary | ICD-10-CM | POA: Diagnosis not present

## 2023-10-05 NOTE — Progress Notes (Unsigned)
 10/06/2023 2:41 PM   Molly Gregory 1950-05-15 [id number]  Referring provider: No referring provider defined for this encounter.  Urological history: 1.  High risk hematuria - Non-smoker - CTU (2017) -bilateral nephrolithiasis - cysto (2017) - NED  2.  Nephrolithiasis - non-contrast CT (2021) -5 mm right renal stone  No chief complaint on file.  HPI: Molly Gregory is a 73 y.o. woman who presents today for 3 month follow-up for right renal stone.  Previous records reviewed.   KUB ***  PMH: Past Medical History:  Diagnosis Date   Allergy    Anxiety    Arthritis    Diverticulosis    H/O exercise stress test    normal   H/O psoriasis    Head injury, closed 2013   Hiatal hernia    History of chicken pox    Hx of acute bronchitis    Hx of migraines    Hyperlipidemia    Hypertension    Irregular heart beat    Kidney stone     Surgical History: Past Surgical History:  Procedure Laterality Date   CARPAL TUNNEL RELEASE Bilateral    both hands, Dr. Arletta   CATARACT EXTRACTION Left    COLONOSCOPY WITH PROPOFOL  N/A 04/24/2020   Procedure: COLONOSCOPY WITH PROPOFOL ;  Surgeon: Jinny Carmine, MD;  Location: [facility] ENDOSCOPY;  Service: Endoscopy;  Laterality: N/A;  COVID POSITIVE ON 04/09/2020 - ASYMPTOMATIC   COLONOSCOPY WITH PROPOFOL  N/A 04/23/2021   Procedure: COLONOSCOPY WITH PROPOFOL ;  Surgeon: Jinny Carmine, MD;  Location: [facility] ENDOSCOPY;  Service: Endoscopy;  Laterality: N/A;   EXTRACORPOREAL SHOCK WAVE LITHOTRIPSY Right 05/14/2023   Procedure: LITHOTRIPSY, ESWL;  Surgeon: Twylla Glendia BROCKS, MD;  Location: [facility] ORS;  Service: Urology;  Laterality: Right;   EYE SURGERY Left    cornea transplant, Dr. Dingeldein   TRIGGER FINGER RELEASE Bilateral    TUBAL LIGATION      Home Medications:  Allergies as of 10/06/2023       Reactions   Penicillins         Medication List        Accurate as of October 05, 2023  2:41 PM. If you have any questions, ask your  nurse or doctor.          albuterol  108 (90 Base) MCG/ACT inhaler Commonly known as: VENTOLIN  HFA SMARTSIG:2 Inhalation Via Inhaler Every 6 Hours PRN   Alpha Lipoic Acid 200 MG Caps Take 200 mg by mouth in the morning and at bedtime.   amLODipine  10 MG tablet Commonly known as: NORVASC  Take 1 tablet (10 mg total) by mouth daily.   ascorbic acid 500 MG tablet Commonly known as: VITAMIN C Take 500 mg by mouth daily.   CALCIUM  600 PO Take by mouth daily.   citalopram  10 MG tablet Commonly known as: CELEXA  Take 1 tablet (10 mg total) by mouth daily.   losartan  25 MG tablet Commonly known as: COZAAR  Take 0.5 tablets (12.5 mg total) by mouth daily.   Nicotinamide 750-27-2-0.5 MG Tabs Take 500 mg by mouth 2 (two) times daily.   Otezla  30 MG Tabs Generic drug: Apremilast  Take 1 tablet by mouth 2 (two) times daily.   prednisoLONE acetate 1 % ophthalmic suspension Commonly known as: PRED FORTE 1 drop 4 (four) times daily.   psyllium 58.6 % packet Commonly known as: METAMUCIL Take 1 packet by mouth daily.   triamcinolone cream 0.1 % Commonly known as: KENALOG Apply 1 application topically 2 (two) times  daily.   Vitamin D3 25 MCG (1000 UT) Caps Take by mouth.   WOMENS MULTIVITAMIN PLUS PO Take by mouth.        Allergies:  Allergies  Allergen Reactions   Penicillins     Family History: Family History  Problem Relation Age of Onset   Hypertension Maternal Aunt    Aneurysm Maternal Aunt    Kidney failure Maternal Aunt    Diabetes Maternal Grandmother    Heart disease Maternal Grandmother    Hypertension Maternal Grandmother    Diabetes Paternal Grandmother    Heart disease Paternal Grandmother    Hypertension Paternal Grandmother    Kidney cancer Cousin    Bladder Cancer Neg Hx     Social History:  reports that she has never smoked. She has never used smokeless tobacco. She reports that she does not drink alcohol and does not use  drugs.  ROS: Pertinent ROS in HPI  Physical Exam: There were no vitals taken for this visit.  Constitutional:  Well nourished. Alert and oriented, No acute distress. HEENT: [place] AT, moist mucus membranes.  Trachea midline, no masses. Cardiovascular: No clubbing, cyanosis, or edema. Respiratory: Normal respiratory effort, no increased work of breathing. GU: No CVA tenderness.  No bladder fullness or masses.  Recession of labia minora, dry, pale vulvar vaginal mucosa and loss of mucosal ridges and folds.  Normal urethral meatus, no lesions, no prolapse, no discharge.   No urethral masses, tenderness and/or tenderness. No bladder fullness, tenderness or masses. *** vagina mucosa, *** estrogen effect, no discharge, no lesions, *** pelvic support, *** cystocele and *** rectocele noted.  No cervical motion tenderness.  Uterus is freely mobile and non-fixed.  No adnexal/parametria masses or tenderness noted.  Anus and perineum are without rashes or lesions.   ***  Neurologic: Grossly intact, no focal deficits, moving all 4 extremities. Psychiatric: Normal mood and affect.    Laboratory Data: N/A  Pertinent Imaging: KUB *** I have independently reviewed the films.  See HPI  Assessment & Plan:    1.  Right ureteral stone - KUB ***   No follow-ups on file.  These notes generated with voice recognition software. I apologize for typographical errors.  Molly Gregory  [facility] [address]  [address] [place], [place] [address] [phone]

## 2023-10-06 ENCOUNTER — Ambulatory Visit (INDEPENDENT_AMBULATORY_CARE_PROVIDER_SITE_OTHER): Admitting: Urology

## 2023-10-06 ENCOUNTER — Encounter: Payer: Self-pay | Admitting: Urology

## 2023-10-06 VITALS — BP 151/67 | HR 68 | Ht 63.0 in | Wt 120.0 lb

## 2023-10-06 DIAGNOSIS — N200 Calculus of kidney: Secondary | ICD-10-CM

## 2023-11-05 DIAGNOSIS — L400 Psoriasis vulgaris: Secondary | ICD-10-CM | POA: Diagnosis not present

## 2023-11-05 DIAGNOSIS — D485 Neoplasm of uncertain behavior of skin: Secondary | ICD-10-CM | POA: Diagnosis not present

## 2023-11-05 DIAGNOSIS — L570 Actinic keratosis: Secondary | ICD-10-CM | POA: Diagnosis not present

## 2023-11-05 DIAGNOSIS — D2262 Melanocytic nevi of left upper limb, including shoulder: Secondary | ICD-10-CM | POA: Diagnosis not present

## 2023-11-05 DIAGNOSIS — D225 Melanocytic nevi of trunk: Secondary | ICD-10-CM | POA: Diagnosis not present

## 2023-11-05 DIAGNOSIS — D2261 Melanocytic nevi of right upper limb, including shoulder: Secondary | ICD-10-CM | POA: Diagnosis not present

## 2023-11-05 DIAGNOSIS — Z85828 Personal history of other malignant neoplasm of skin: Secondary | ICD-10-CM | POA: Diagnosis not present

## 2023-11-05 DIAGNOSIS — D2272 Melanocytic nevi of left lower limb, including hip: Secondary | ICD-10-CM | POA: Diagnosis not present

## 2023-11-05 DIAGNOSIS — C44729 Squamous cell carcinoma of skin of left lower limb, including hip: Secondary | ICD-10-CM | POA: Diagnosis not present

## 2023-11-20 ENCOUNTER — Ambulatory Visit: Admitting: Family Medicine

## 2023-11-20 ENCOUNTER — Ambulatory Visit: Payer: Self-pay

## 2023-11-20 NOTE — Telephone Encounter (Signed)
 FYI Only or Action Required?: FYI only for provider.  Patient was last seen in primary care on 07/14/2023 by Hope Merle, MD.  Called Nurse Triage reporting Dysuria.  Symptoms began a week ago.  Interventions attempted: Rest, hydration, or home remedies.  Symptoms are: gradually worsening.  Triage Disposition: See HCP Within 4 Hours (Or PCP Triage)  Patient/caregiver understands and will follow disposition?: Yes   Copied from CRM [id number]. Topic: Clinical - Red Word Triage >> Nov 20, 2023  2:33 PM Rea C wrote: Red Word that prompted transfer to Nurse Triage: Patient is experiencing lower back pain and burning when urinating. Doesn't have an appointment until November for [facility] with Dr. Abbey. Reason for Disposition  Side (flank) or lower back pain present  Answer Assessment - Initial Assessment Questions Additional info: Patient called to say she is having urinary tract infection symptoms and would like to have her urine tested. No appointments are available at pcp practice location, offered next available SDV at alternate regional office but patient stated Can't I just come into [facility], I just pulled into your parking lot for the lab, this writer called to [facility] to see if appointment available or walk in for lab as requested by patient, advised patient needs an appointment. This Clinical research associate advised patient lab only for urine testing is not an option will need an appointment. Patient accepted acute visit at [facility], she is unfamiliar with location, no gps, this writer assisted patient will location by providing with near by landmarks as noted in google maps. Patient will call back for further needs and follow up.     1. SEVERITY: How bad is the pain?  (e.g., Scale 1-10; mild, moderate, or severe)     burning 2. FREQUENCY: How many times have you had painful urination today?      Increased  3. PATTERN: Is pain present every time you urinate or just sometimes?       each 4. ONSET: When did the painful urination start?      One week 5. FEVER: Do you have a fever? If Yes, ask: What is your temperature, how was it measured, and when did it start?     Denies  6. PAST UTI: Have you had a urine infection before? If Yes, ask: When was the last time? and What happened that time?      No, kidney infection in the past 7. CAUSE: What do you think is causing the painful urination?  (e.g., UTI, scratch, Herpes sore)     Urinary tract infection 8. OTHER SYMPTOMS: Do you have any other symptoms? (e.g., blood in urine, flank pain, genital sores, urgency, vaginal discharge)     Low back pain 9. PREGNANCY: Is there any chance you are pregnant? When was your last menstrual period?  Protocols used: Urination Pain - Female-A-AH

## 2023-11-23 ENCOUNTER — Encounter: Payer: Self-pay | Admitting: Internal Medicine

## 2023-11-23 ENCOUNTER — Ambulatory Visit (INDEPENDENT_AMBULATORY_CARE_PROVIDER_SITE_OTHER): Admitting: Internal Medicine

## 2023-11-23 VITALS — BP 124/78 | HR 73 | Temp 98.0°F | Ht 63.0 in | Wt 123.2 lb

## 2023-11-23 DIAGNOSIS — I10 Essential (primary) hypertension: Secondary | ICD-10-CM

## 2023-11-23 DIAGNOSIS — R1030 Lower abdominal pain, unspecified: Secondary | ICD-10-CM

## 2023-11-23 DIAGNOSIS — N201 Calculus of ureter: Secondary | ICD-10-CM | POA: Insufficient documentation

## 2023-11-23 DIAGNOSIS — R300 Dysuria: Secondary | ICD-10-CM

## 2023-11-23 LAB — POC URINALSYSI DIPSTICK (AUTOMATED)
Bilirubin, UA: NEGATIVE
Glucose, UA: NEGATIVE
Ketones, UA: NEGATIVE
Leukocytes, UA: NEGATIVE
Nitrite, UA: NEGATIVE
Protein, UA: NEGATIVE
Spec Grav, UA: 1.01 (ref 1.010–1.025)
Urobilinogen, UA: 0.2 U/dL
pH, UA: 7 (ref 5.0–8.0)

## 2023-11-23 NOTE — Assessment & Plan Note (Signed)
-   This problem is chronic and stable -Patient is on amlodipine  10 mg daily and losartan  25 mg daily -Blood pressure today is at goal (124/78) -Will continue current medications for now

## 2023-11-23 NOTE — Assessment & Plan Note (Signed)
-   Patient has a history of a right ureteral stone status post lithotripsy in March of this year -She did have an x-ray after that which showed some retained stone fragments but her most recent x-ray in August did not show any -She presents today with mild bilateral flank pain radiating to the groin and increased urinary frequency, urgency as well as mild dysuria -UA was negative except for trace blood.  Will follow-up urine culture -Given her history of nephrolithiasis and her symptoms with a negative UA I am concerned for recurrent stone -Will obtain stat CT renal stone study -If positive will refer back to urology for further evaluation

## 2023-11-23 NOTE — Progress Notes (Signed)
 Acute Office Visit  Subjective:     Patient ID: Molly Gregory, female    DOB: 1951-01-18, 73 y.o.   MRN: [id number]  Chief Complaint  Patient presents with   Acute Visit    Low back & pelvic pain Urine frequency  A little burning x 2 weeks    Discussed the use of AI scribe software for clinical note transcription with the patient, who gave verbal consent to proceed.  History of Present Illness Molly Gregory is a 73 year old female with a history of kidney stones who presents with frequent urination and lower back pain.  Lower urinary tract symptoms - Frequent urination for the past two weeks - Mild burning sensation with urination - Urgent need to urinate frequently - No fever, chills, nausea, vomiting, or changes in urine color - No hematuria  Lower back and groin pain - Lower back pain, more pronounced over the past two weeks.  Pain is paraspinal and bilateral - Pain is similar to baseline back pain from spinal deterioration and psoriatic arthritis, but increased in intensity - Recent onset of groin pain  History of nephrolithiasis - History of kidney stones - She had a lithotripsy done earlier this year (March 2025) for a right renal stone.  Initial x-ray did show some possible retained stone fragments but repeat x-ray done in August showed no evidence of retained stones - Current pain does not resemble previous kidney stone episodes  Hypertension -Patient with a history of hypertension that is well-controlled on amlodipine  10 mg and losartan  25 mg daily -Blood pressure today is at goal (124/78)    Review of Systems  Constitutional: Negative.   HENT: Negative.    Respiratory: Negative.    Cardiovascular: Negative.   Gastrointestinal: Negative.  Negative for abdominal pain, nausea and vomiting.  Genitourinary:  Positive for dysuria, flank pain, frequency and urgency. Negative for hematuria.  Musculoskeletal:        Patient with bilateral flank pain  radiating to the groin  Neurological: Negative.   Psychiatric/Behavioral: Negative.          Objective:    BP 124/78   Pulse 73   Temp 98 F (36.7 C)   Ht 5' 3 (1.6 m)   Wt 123 lb 3.2 oz (55.9 kg)   SpO2 99%   BMI 21.82 kg/m    Physical Exam Constitutional:      Appearance: Normal appearance.  HENT:     Head: Normocephalic and atraumatic.  Cardiovascular:     Rate and Rhythm: Normal rate and regular rhythm.     Heart sounds: Normal heart sounds.  Pulmonary:     Effort: Pulmonary effort is normal.     Breath sounds: Normal breath sounds. No wheezing, rhonchi or rales.  Abdominal:     General: Bowel sounds are normal. There is no distension.     Palpations: Abdomen is soft.     Tenderness: There is no abdominal tenderness. There is no right CVA tenderness, left CVA tenderness, guarding or rebound.  Musculoskeletal:        General: No swelling or tenderness.     Right lower leg: No edema.     Left lower leg: No edema.  Neurological:     Mental Status: She is alert.  Psychiatric:        Mood and Affect: Mood normal.        Behavior: Behavior normal.     Results for orders placed or performed in visit on  11/23/23  POCT Urinalysis Dipstick (Automated)  Result Value Ref Range   Color, UA light yellow    Clarity, UA clear    Glucose, UA Negative Negative   Bilirubin, UA neg    Ketones, UA neg    Spec Grav, UA 1.010 1.010 - 1.025   Blood, UA trace intact    pH, UA 7.0 5.0 - 8.0   Protein, UA Negative Negative   Urobilinogen, UA 0.2 0.2 or 1.0 E.U./dL   Nitrite, UA neg    Leukocytes, UA Negative Negative        Assessment & Plan:   Problem List Items Addressed This Visit       Cardiovascular and Mediastinum   Essential hypertension, benign (Chronic)   - This problem is chronic and stable -Patient is on amlodipine  10 mg daily and losartan  25 mg daily -Blood pressure today is at goal (124/78) -Will continue current medications for now         Genitourinary   Right ureteral stone   - Patient has a history of a right ureteral stone status post lithotripsy in March of this year -She did have an x-ray after that which showed some retained stone fragments but her most recent x-ray in August did not show any -She presents today with mild bilateral flank pain radiating to the groin and increased urinary frequency, urgency as well as mild dysuria -UA was negative except for trace blood.  Will follow-up urine culture -Given her history of nephrolithiasis and her symptoms with a negative UA I am concerned for recurrent stone -Will obtain stat CT renal stone study -If positive will refer back to urology for further evaluation      Other Visit Diagnoses       Dysuria    -  Primary   Relevant Orders   POCT Urinalysis Dipstick (Automated) (Completed)   Urine Culture     Lower abdominal pain       Relevant Orders   CT RENAL STONE STUDY       No orders of the defined types were placed in this encounter.   No follow-ups on file.  Javona Bergevin, MD

## 2023-11-23 NOTE — Patient Instructions (Signed)
  VISIT SUMMARY: During today's visit, we discussed your frequent urination and lower back pain. Given your history of kidney stones, we are considering the possibility of another kidney stone. We also reviewed your blood pressure, which is well-controlled with your current medications.  YOUR PLAN: -SUSPECTED NEPHROLITHIASIS WITH DYSURIA AND URINARY FREQUENCY: Nephrolithiasis means the presence of kidney stones. Your symptoms of frequent urination, mild burning sensation, and lower back pain suggest that you might have another kidney stone. We will perform a CT scan of your abdomen and pelvis to check for kidney stones. If the scan confirms this, we will refer you to a urologist for further treatment.  -HYPERTENSION: Hypertension means high blood pressure. Your blood pressure is well-controlled with your current medications, so no changes are needed at this time.  INSTRUCTIONS: Please schedule a CT scan of your abdomen and pelvis as soon as possible. If the scan shows kidney stones, we will refer you to a urologist for further evaluation and treatment. Continue taking your blood pressure medications as prescribed and monitor your blood pressure regularly.   Of note, your urinalysis does not show evidence of a urinary tract infection at this time but did show some blood.  Given your history of nephrolithiasis this is concerning for possible recurrent stone                Contains text generated by [employer]bridge.                                 Contains text generated by [employer]bridge.

## 2023-11-24 ENCOUNTER — Ambulatory Visit
Admission: RE | Admit: 2023-11-24 | Discharge: 2023-11-24 | Disposition: A | Discharge status: Home / Self Care | Source: Ambulatory Visit | Attending: Internal Medicine | Admitting: Internal Medicine

## 2023-11-24 ENCOUNTER — Other Ambulatory Visit: Payer: Self-pay | Admitting: Urology

## 2023-11-24 ENCOUNTER — Ambulatory Visit: Payer: Self-pay | Admitting: Internal Medicine

## 2023-11-24 DIAGNOSIS — R1030 Lower abdominal pain, unspecified: Secondary | ICD-10-CM | POA: Insufficient documentation

## 2023-11-24 DIAGNOSIS — K5730 Diverticulosis of large intestine without perforation or abscess without bleeding: Secondary | ICD-10-CM | POA: Diagnosis not present

## 2023-11-24 DIAGNOSIS — K8020 Calculus of gallbladder without cholecystitis without obstruction: Secondary | ICD-10-CM | POA: Diagnosis not present

## 2023-11-24 DIAGNOSIS — N2889 Other specified disorders of kidney and ureter: Secondary | ICD-10-CM

## 2023-11-24 DIAGNOSIS — R109 Unspecified abdominal pain: Secondary | ICD-10-CM | POA: Diagnosis not present

## 2023-11-24 DIAGNOSIS — N200 Calculus of kidney: Secondary | ICD-10-CM | POA: Diagnosis not present

## 2023-11-24 LAB — URINE CULTURE
MICRO NUMBER:: 16998876
Result:: NO GROWTH
SPECIMEN QUALITY:: ADEQUATE

## 2023-11-25 ENCOUNTER — Telehealth: Payer: Self-pay | Admitting: Urology

## 2023-11-25 NOTE — Telephone Encounter (Signed)
 Error

## 2023-12-01 ENCOUNTER — Telehealth: Payer: Self-pay | Admitting: Urology

## 2023-12-01 ENCOUNTER — Ambulatory Visit
Admission: RE | Admit: 2023-12-01 | Discharge: 2023-12-01 | Disposition: A | Discharge status: Home / Self Care | Source: Ambulatory Visit | Attending: Urology | Admitting: Urology

## 2023-12-01 DIAGNOSIS — N200 Calculus of kidney: Secondary | ICD-10-CM | POA: Diagnosis not present

## 2023-12-01 DIAGNOSIS — N2889 Other specified disorders of kidney and ureter: Secondary | ICD-10-CM | POA: Diagnosis not present

## 2023-12-01 DIAGNOSIS — K429 Umbilical hernia without obstruction or gangrene: Secondary | ICD-10-CM | POA: Diagnosis not present

## 2023-12-01 MED ORDER — IOHEXOL 300 MG/ML  SOLN
100.0000 mL | Freq: Once | INTRAMUSCULAR | Status: AC | PRN
  Administered 2023-12-01: 100 mL via INTRAVENOUS

## 2023-12-01 NOTE — Telephone Encounter (Signed)
 I spoke with Molly Gregory about the radiation exposure from the CT and explained that she has not had a history of having several CT scans, so the risk today is minimal in regards to what we are trying to determine on the CT scan.  She agrees with we will proceed with a CT.

## 2023-12-01 NOTE — Telephone Encounter (Signed)
 Pt is wondering why a CT was ordered, her family is concerned about the radiation vs an MRI.  She has had several x-rays, so she is confirmed about the amount of radiation she'll be getting.  Her CT appt is today at 1:30.

## 2023-12-02 NOTE — Progress Notes (Signed)
 12/03/2023 6:17 PM   Delon JINNY Roers [id number] [id number]  Referring provider: Abbey Bruckner, MD [address] [place],  [place] [address]  Urological history: 1.  High risk hematuria - Non-smoker - CTU (2017) -bilateral nephrolithiasis - cysto (2017) - NED  2.  Nephrolithiasis - non-contrast CT (2021) -5 mm right renal stone  Chief Complaint  Patient presents with   Results   HPI: Molly Gregory is a 73 y.o. woman who presents today for CT results with husband, Alm.   Previous records reviewed.   She presented to her PCP's office with complaints of mild bilateral flank pain radiating to the groin, increased urinary frequency, urgency as well as mild dysuria.  UA dip with trace blood.  Urine culture was negative.  CT renal study noted altered morphology of the right kidney suspicious for a subcapsular hematoma vs kidney mass.  A CT w/wo of the abdomen notes an infiltrating enhancing area in the right lower pole of the kidney associated with a thin rind of soft tissue along the right kidney interpolar region which include a differential of infiltrative renal mass such as a lymphoma however atypical presentation of focal pyelonephritis because of similar appearance.  They are recommending a MRI of the abdomen as per renal mass protocol to attempt differentiation.    UA unremarkable.    PMH: Past Medical History:  Diagnosis Date   Allergy    Anxiety    Arthritis    Diverticulosis    H/O exercise stress test    normal   H/O psoriasis    Head injury, closed 2013   Hiatal hernia    History of chicken pox    Hx of acute bronchitis    Hx of migraines    Hyperlipidemia    Hypertension    Irregular heart beat    Kidney stone     Surgical History: Past Surgical History:  Procedure Laterality Date   CARPAL TUNNEL RELEASE Bilateral    both hands, Dr. Arletta   CATARACT EXTRACTION Left    COLONOSCOPY WITH PROPOFOL  N/A 04/24/2020   Procedure: COLONOSCOPY  WITH PROPOFOL ;  Surgeon: Jinny Carmine, MD;  Location: [facility] ENDOSCOPY;  Service: Endoscopy;  Laterality: N/A;  COVID POSITIVE ON 04/09/2020 - ASYMPTOMATIC   COLONOSCOPY WITH PROPOFOL  N/A 04/23/2021   Procedure: COLONOSCOPY WITH PROPOFOL ;  Surgeon: Jinny Carmine, MD;  Location: [facility] ENDOSCOPY;  Service: Endoscopy;  Laterality: N/A;   EXTRACORPOREAL SHOCK WAVE LITHOTRIPSY Right 05/14/2023   Procedure: LITHOTRIPSY, ESWL;  Surgeon: Twylla Glendia BROCKS, MD;  Location: [facility] ORS;  Service: Urology;  Laterality: Right;   EYE SURGERY Left    cornea transplant, Dr. Dingeldein   TRIGGER FINGER RELEASE Bilateral    TUBAL LIGATION      Home Medications:  Allergies as of 12/03/2023       Reactions   Penicillins         Medication List        Accurate as of December 03, 2023 11:59 PM. If you have any questions, ask your nurse or doctor.          STOP taking these medications    WOMENS MULTIVITAMIN PLUS PO Stopped by: CLOTILDA Josemaria Brining       TAKE these medications    albuterol  108 (90 Base) MCG/ACT inhaler Commonly known as: VENTOLIN  HFA SMARTSIG:2 Inhalation Via Inhaler Every 6 Hours PRN   Alpha Lipoic Acid 200 MG Caps Take 200 mg by mouth in the morning and at bedtime.  amLODipine  10 MG tablet Commonly known as: NORVASC  Take 1 tablet (10 mg total) by mouth daily.   ascorbic acid 500 MG tablet Commonly known as: VITAMIN C Take 500 mg by mouth daily.   CALCIUM  600 PO Take by mouth daily.   citalopram  10 MG tablet Commonly known as: CELEXA  Take 1 tablet (10 mg total) by mouth daily.   diazepam  5 MG tablet Commonly known as: Valium  Take one tablet 30 minutes prior to the MRI and then if still anxious take second tablet Started by: CLOTILDA CORNWALL   losartan  25 MG tablet Commonly known as: COZAAR  Take 0.5 tablets (12.5 mg total) by mouth daily.   Nicotinamide 750-27-2-0.5 MG Tabs Take 500 mg by mouth 2 (two) times daily.   nitrofurantoin  (macrocrystal-monohydrate) 100 MG  capsule Commonly known as: MACROBID  Take 1 capsule (100 mg total) by mouth every 12 (twelve) hours. Started by: CLOTILDA CORNWALL   Otezla  30 MG Tabs Generic drug: Apremilast  Take 1 tablet by mouth 2 (two) times daily.   prednisoLONE acetate 1 % ophthalmic suspension Commonly known as: PRED FORTE 1 drop 4 (four) times daily.   psyllium 58.6 % packet Commonly known as: METAMUCIL Take 1 packet by mouth daily.   triamcinolone cream 0.1 % Commonly known as: KENALOG Apply 1 application topically 2 (two) times daily.   Vitamin D3 25 MCG (1000 UT) Caps Take by mouth.        Allergies:  Allergies  Allergen Reactions   Penicillins     Family History: Family History  Problem Relation Age of Onset   Hypertension Maternal Aunt    Aneurysm Maternal Aunt    Kidney failure Maternal Aunt    Diabetes Maternal Grandmother    Heart disease Maternal Grandmother    Hypertension Maternal Grandmother    Diabetes Paternal Grandmother    Heart disease Paternal Grandmother    Hypertension Paternal Grandmother    Kidney cancer Cousin    Bladder Cancer Neg Hx     Social History:  reports that she has never smoked. She has never used smokeless tobacco. She reports that she does not drink alcohol and does not use drugs.  ROS: Pertinent ROS in HPI  Physical Exam: BP (!) 160/66   Pulse 60   Ht 5' 3 (1.6 m)   Wt 121 lb (54.9 kg)   BMI 21.43 kg/m   Constitutional:  Well nourished. Alert and oriented, No acute distress. HEENT: [place] AT, moist mucus membranes.  Trachea midline Cardiovascular: No clubbing, cyanosis, or edema. Respiratory: Normal respiratory effort, no increased work of breathing. Neurologic: Grossly intact, no focal deficits, moving all 4 extremities. Psychiatric: Normal mood and affect.    Laboratory Data: See  Epic and HPI  Pertinent Imaging: CLINICAL DATA:  Renal mass/cyst, indeterminate.   EXAM: CT ABDOMEN WITHOUT AND WITH CONTRAST   TECHNIQUE: Multidetector  CT imaging of the abdomen was performed following the standard protocol before and following the bolus administration of intravenous contrast.   RADIATION DOSE REDUCTION: This exam was performed according to the departmental dose-optimization program which includes automated exposure control, adjustment of the mA and/or kV according to patient size and/or use of iterative reconstruction technique.   CONTRAST:  100mL OMNIPAQUE IOHEXOL 300 MG/ML  SOLN   COMPARISON:  CT scan renal stone protocol from 11/24/2023.   FINDINGS: Lower chest: There are patchy atelectatic changes in the visualized lung bases. No overt consolidation. No pleural effusion. The heart is normal in size. No pericardial effusion.   Hepatobiliary:  The liver is normal in size. Non-cirrhotic configuration. No suspicious mass. These is mild diffuse hepatic steatosis. no intrahepatic or extrahepatic bile duct dilation. There are 2 peripherally rim calcified dependent gallstones without imaging signs of acute cholecystitis. Normal gallbladder wall thickness. No pericholecystic inflammatory changes.   Pancreas: Unremarkable. No pancreatic ductal dilatation or surrounding inflammatory changes.   Spleen: Within normal limits. No focal lesion.   Adrenals/Urinary Tract: Adrenal glands are unremarkable.   Non-contrast images: There are 3, punctate, 1-1.5 mm nonobstructing calculi in the right kidney lower pole. No other nephroureterolithiasis on either side.   Kidneys: There is interval increase in the size of the right kidney when compared to the prior exam from 2018. There is a heterogeneous, infiltrating enhancing area in the right kidney lower pole, cortex/renal sinus fat measuring approximately 3.9 x 4.7 cm orthogonally on sagittal plane (series 19, image 51). There is associated thin rind of soft tissue along the right kidney interpolar region, laterally with thickness up to 6-7 mm. There is no resultant  hydronephrosis or focal pyelectasis. Differential diagnosis includes infiltrative renal mass such as lymphoma however, atypical presentation of focal pyelonephritis can also have similar appearance.   Left kidney is otherwise unremarkable.  No focal mass.   Urinary Tract Opacification: Adequate.   Collecting Systems and Ureters: No filling defects, masses, strictures, or areas of abnormal dilatation.   Stomach/Bowel: There is mild-to-moderate asymmetric circumferential low-attenuation thickening of the pylorus of the stomach without significant perigastric fat stranding. Findings are nonspecific and likely accentuated by underdistention. Correlate clinically and with upper GI endoscopy to exclude underlying focal peptic ulcer or gastritis. No disproportionate dilation of the small or large bowel loops. No evidence of abnormal bowel wall thickening or inflammatory changes.   Vascular/Lymphatic: No ascites or pneumoperitoneum. No abdominal lymphadenopathy, by size criteria. No aneurysmal dilation of the major abdominal arteries. There are mild peripheral atherosclerotic vascular calcifications of the aorta and its major branches. There is subtle fat stranding and nonspecific soft tissue along the right wall of the celiac trunk, which is of unknown significance/etiology. Celiac trunk and superior mesenteric artery appear normal in caliber and without significant stenosis or dissection.   Other: There is a tiny fat containing umbilical hernia. The soft tissues and abdominal wall are otherwise unremarkable.   Musculoskeletal: No suspicious osseous lesions. There are mild - moderate multilevel degenerative changes in the visualized spine.   IMPRESSION: 1. There is a heterogeneous, infiltrating enhancing area in the right kidney lower pole cortex/renal sinus fat measuring approximately 3.9 x 4.7 cm orthogonally on sagittal plane. There is associated thin rind of soft tissue along the  right kidney interpolar region, laterally with thickness up to 6-7 mm. There is no resultant hydronephrosis or focal pyelectasis. Differential diagnosis includes infiltrative renal mass such as lymphoma however, atypical presentation of focal pyelonephritis can also have similar appearance. Correlate clinically and with urinalysis. Multiphasic contrast-enhanced MRI abdomen as per renal mass protocol is also recommended to attempt differentiation. 2. There is mild-to-moderate asymmetric circumferential low-attenuation thickening of the pylorus of the stomach without significant perigastric fat stranding. Findings are nonspecific and likely accentuated by underdistention. Correlate clinically and with upper GI endoscopy to exclude underlying focal peptic ulcer or gastritis. 3. No splenomegaly or abnormal upper abdominal lymphadenopathy. 4. Multiple other nonacute observations, as described above.   Aortic Atherosclerosis (ICD10-I70.0).     Electronically Signed   By: Ree Molt M.D.   On: 12/03/2023 10:00 I have independently reviewed the films.  See HPI  Assessment & Plan:    1.  Right renal lesion with infiltrative characteristics concerning for neoplastic process, although atypical infection cannot be excluded. No evidence of obstructive uropathy. Clinical correlation and further imaging warranted -  Order multiphasic contrast-enhanced MRI abdomen per renal mass protocol to further characterize lesion -  Obtain urinalysis and urine culture to assess for infectious etiology - Follow-up in urology clinic upon completion of imaging and lab results. - UA unremarkable - urine culture pending - started on Macrobid  in an abundance of caution - given Valium  for anxiety to take 30 minutes prior to MRI   Return for MRI report .  These notes generated with voice recognition software. I apologize for typographical errors.  CLOTILDA HELON RIGGERS  [facility] [address]  [address] [place], [place] [address] [phone]

## 2023-12-03 ENCOUNTER — Ambulatory Visit: Admitting: Urology

## 2023-12-03 VITALS — BP 160/66 | HR 60 | Ht 63.0 in | Wt 121.0 lb

## 2023-12-03 DIAGNOSIS — N2889 Other specified disorders of kidney and ureter: Secondary | ICD-10-CM

## 2023-12-03 DIAGNOSIS — S37011A Minor contusion of right kidney, initial encounter: Secondary | ICD-10-CM | POA: Diagnosis not present

## 2023-12-03 LAB — URINALYSIS, COMPLETE
Bilirubin, UA: NEGATIVE
Glucose, UA: NEGATIVE
Ketones, UA: NEGATIVE
Leukocytes,UA: NEGATIVE
Nitrite, UA: NEGATIVE
Protein,UA: NEGATIVE
RBC, UA: NEGATIVE
Specific Gravity, UA: 1.02 (ref 1.005–1.030)
Urobilinogen, Ur: 0.2 mg/dL (ref 0.2–1.0)
pH, UA: 6 (ref 5.0–7.5)

## 2023-12-03 LAB — MICROSCOPIC EXAMINATION

## 2023-12-03 MED ORDER — DIAZEPAM 5 MG PO TABS: ORAL_TABLET | ORAL | 0 refills | Status: DC

## 2023-12-03 MED ORDER — NITROFURANTOIN MONOHYD MACRO 100 MG PO CAPS: 100.0000 mg | ORAL_CAPSULE | Freq: Two times a day (BID) | ORAL | 0 refills | Status: DC

## 2023-12-03 NOTE — Patient Instructions (Addendum)
 Please arrive to the medial mall at [facility] at 5:30 pm tomorrow, 12/04/2023 for your 6 pm MRI   Take one 5 mg Valium  30 minutes prior to the MRI and then another 5 mg just before MRI if still anxious.  Must have a driver.

## 2023-12-04 ENCOUNTER — Ambulatory Visit
Admission: RE | Admit: 2023-12-04 | Discharge: 2023-12-04 | Disposition: A | Discharge status: Home / Self Care | Source: Ambulatory Visit | Attending: Urology | Admitting: Urology

## 2023-12-04 DIAGNOSIS — N2889 Other specified disorders of kidney and ureter: Secondary | ICD-10-CM | POA: Insufficient documentation

## 2023-12-04 MED ORDER — GADOBUTROL 1 MMOL/ML IV SOLN
5.0000 mL | Freq: Once | INTRAVENOUS | Status: AC | PRN
  Administered 2023-12-04: 5 mL via INTRAVENOUS

## 2023-12-06 ENCOUNTER — Encounter: Payer: Self-pay | Admitting: Urology

## 2023-12-06 ENCOUNTER — Ambulatory Visit: Payer: Self-pay | Admitting: Urology

## 2023-12-09 LAB — CULTURE, URINE COMPREHENSIVE

## 2023-12-16 NOTE — Progress Notes (Signed)
 Called patient and patient understood. And no burning

## 2023-12-22 DIAGNOSIS — D485 Neoplasm of uncertain behavior of skin: Secondary | ICD-10-CM | POA: Diagnosis not present

## 2023-12-22 DIAGNOSIS — C44729 Squamous cell carcinoma of skin of left lower limb, including hip: Secondary | ICD-10-CM | POA: Diagnosis not present

## 2023-12-22 DIAGNOSIS — R208 Other disturbances of skin sensation: Secondary | ICD-10-CM | POA: Diagnosis not present

## 2024-01-05 ENCOUNTER — Ambulatory Visit (INDEPENDENT_AMBULATORY_CARE_PROVIDER_SITE_OTHER)

## 2024-01-05 ENCOUNTER — Telehealth: Payer: Self-pay

## 2024-01-05 VITALS — BP 120/60 | HR 67 | Temp 98.6°F | Ht 63.0 in | Wt 122.6 lb

## 2024-01-05 DIAGNOSIS — E785 Hyperlipidemia, unspecified: Secondary | ICD-10-CM

## 2024-01-05 DIAGNOSIS — I10 Essential (primary) hypertension: Secondary | ICD-10-CM

## 2024-01-05 DIAGNOSIS — K862 Cyst of pancreas: Secondary | ICD-10-CM | POA: Insufficient documentation

## 2024-01-05 DIAGNOSIS — K8020 Calculus of gallbladder without cholecystitis without obstruction: Secondary | ICD-10-CM | POA: Insufficient documentation

## 2024-01-05 DIAGNOSIS — R7309 Other abnormal glucose: Secondary | ICD-10-CM | POA: Diagnosis not present

## 2024-01-05 DIAGNOSIS — I951 Orthostatic hypotension: Secondary | ICD-10-CM

## 2024-01-05 DIAGNOSIS — F39 Unspecified mood [affective] disorder: Secondary | ICD-10-CM

## 2024-01-05 LAB — COMPREHENSIVE METABOLIC PANEL WITH GFR
ALT: 13 U/L (ref 0–35)
AST: 19 U/L (ref 0–37)
Albumin: 4.2 g/dL (ref 3.5–5.2)
Alkaline Phosphatase: 56 U/L (ref 39–117)
BUN: 15 mg/dL (ref 6–23)
CO2: 31 meq/L (ref 19–32)
Calcium: 9.9 mg/dL (ref 8.4–10.5)
Chloride: 100 meq/L (ref 96–112)
Creatinine, Ser: 0.84 mg/dL (ref 0.40–1.20)
GFR: 68.07 mL/min (ref >60.00)
Glucose, Bld: 99 mg/dL (ref 70–99)
Potassium: 4.3 meq/L (ref 3.5–5.1)
Sodium: 140 meq/L (ref 135–145)
Total Bilirubin: 0.8 mg/dL (ref 0.2–1.2)
Total Protein: 7 g/dL (ref 6.0–8.3)

## 2024-01-05 LAB — LIPID PANEL
Cholesterol: 183 mg/dL (ref 0–200)
HDL: 76.4 mg/dL (ref >39.00)
LDL Cholesterol: 91 mg/dL (ref 0–99)
NonHDL: 106.44
Total CHOL/HDL Ratio: 2
Triglycerides: 76 mg/dL (ref 0.0–149.0)
VLDL: 15.2 mg/dL (ref 0.0–40.0)

## 2024-01-05 LAB — HEMOGLOBIN A1C: Hgb A1c MFr Bld: 5.9 % (ref 4.6–6.5)

## 2024-01-05 MED ORDER — AMLODIPINE BESYLATE 10 MG PO TABS: 10.0000 mg | ORAL_TABLET | Freq: Every day | ORAL | 3 refills | Status: AC

## 2024-01-05 MED ORDER — LOSARTAN POTASSIUM 25 MG PO TABS: 12.5000 mg | ORAL_TABLET | Freq: Every day | ORAL | 3 refills | Status: AC

## 2024-01-05 MED ORDER — CITALOPRAM HYDROBROMIDE 10 MG PO TABS: 10.0000 mg | ORAL_TABLET | Freq: Every day | ORAL | 3 refills | Status: AC

## 2024-01-05 NOTE — Assessment & Plan Note (Addendum)
 Blood pressure well-controlled with current regimen. No medication adjustment needed.  - Continue current antihypertensive regimen Amlodipine  10 mg, Losartan  12.5 mg daily.  - Monitor blood pressure at home once or twice a week. - Bring home blood pressure readings to next appointment. - Check CMP today.  Orders:   amLODipine  (NORVASC ) 10 MG tablet; Take 1 tablet (10 mg total) by mouth daily.   losartan  (COZAAR ) 25 MG tablet; Take 0.5 tablets (12.5 mg total) by mouth daily.

## 2024-01-05 NOTE — Telephone Encounter (Signed)
 Pt has dropped off her blood pressure readings and has been placed in the folder up front . Thanks.

## 2024-01-05 NOTE — Assessment & Plan Note (Deleted)
 Molly Gregory

## 2024-01-05 NOTE — Progress Notes (Signed)
 Established Patient Office Visit TOC from Dr. Hope   Subjective  Patient ID: Molly Gregory, female    DOB: [birth year]  Age: 73 y.o. MRN: [id number]  Chief Complaint  Patient presents with   Establish Care    Discussed the use of AI scribe software for clinical note transcription with the patient, who gave verbal consent to proceed.  History of Present Illness Molly Gregory is a 73 year old female who presents for transfer of care from previous PCP.   She has a history of recurrent kidney stones and previously underwent a lithotripsy procedure. She had MRI abdomen on 12/04/23 for suspicious renal finding on renal ultrasound. She is recommended to have repeat MRI in 6 months. She follows up with urologist PA, McGowan for this. She does not have new urinary symptoms.   She was found to have incidental cholelithiasis and multiple cystic pancreas on MRI in 12/04/23. Patient without abdominal pain/symptoms. She has not experienced symptoms such as right upper quadrant pain, jaundice, or fever, and has not pursued treatment for the gallstones.  Her medication regimen includes albuterol  as needed for asthma, primarily during colds, with no hospitalizations for exacerbations. She takes amlodipine  10 mg, Losartan  12.5 mg  daily for hypertension. At home BP has been good. No chest pain.   She also takes vitamin C, calcium  600 mg daily, vitamin D  1000 IU daily, citalopram  10 mg for mood.  She attempted to discontinue citalopram  but experienced increased nervousness.  She has a history of psoriasis and is on Otezla . She has had multiple squamous cell carcinomas removed and is scheduled for further dermatological follow-up. She uses triamcinolone cream as needed for skin issues.  She underwent corneal transplants for Fuchs corneal dystrophy, which is familial, and uses prednisone  eye drops. Her vision is stable with glasses.  She experiences neuropathy in her feet, attributed to prediabetes,  and takes nicotinamide for this condition. She also uses a homemade peppermint oil lotion for relief. She follows a plant-based diet but includes some meat, and takes a multivitamin. Her B12 levels were previously normal but on the lower side.  Her blood pressure readings at home have been stable, though she notes swelling in her legs with high salt intake. She monitors her blood pressure regularly and reports it to her healthcare provider.    ROS As per HPI    Objective:     BP 120/60   Pulse 67   Temp 98.6 F (37 C) (Oral)   Ht 5' 3 (1.6 m)   Wt 122 lb 9.6 oz (55.6 kg)   SpO2 98%   BMI 21.72 kg/m      01/05/2024   10:06 AM 11/23/2023    8:18 AM 07/14/2023    9:15 AM  Depression screen PHQ 2/9  Decreased Interest 0 0 0  Down, Depressed, Hopeless 0 0 0  PHQ - 2 Score 0 0 0  Altered sleeping 0 0 0  Tired, decreased energy 0 0 0  Change in appetite 0 0 0  Feeling bad or failure about yourself  0 0 0  Trouble concentrating 0 0 0  Moving slowly or fidgety/restless 0 0 0  Suicidal thoughts 0 0 0  PHQ-9 Score 0 0 0  Difficult doing work/chores Not difficult at all Not difficult at all Not difficult at all      01/05/2024   10:07 AM 11/23/2023    8:18 AM 07/14/2023    9:15 AM 01/13/2023    9:07  AM  GAD 7 : Generalized Anxiety Score  Nervous, Anxious, on Edge 0 0 0 0  Control/stop worrying 0 0 0 0  Worry too much - different things 0 0 0 0  Trouble relaxing 0 0 0 0  Restless 0 0 0 0  Easily annoyed or irritable 0 0 0 0  Afraid - awful might happen 0 0 0 0  Total GAD 7 Score 0 0 0 0  Anxiety Difficulty Not difficult at all Not difficult at all Not difficult at all Not difficult at all      01/05/2024   10:06 AM 11/23/2023    8:18 AM 07/14/2023    9:15 AM  Depression screen PHQ 2/9  Decreased Interest 0 0 0  Down, Depressed, Hopeless 0 0 0  PHQ - 2 Score 0 0 0  Altered sleeping 0 0 0  Tired, decreased energy 0 0 0  Change in appetite 0 0 0  Feeling bad or failure  about yourself  0 0 0  Trouble concentrating 0 0 0  Moving slowly or fidgety/restless 0 0 0  Suicidal thoughts 0 0 0  PHQ-9 Score 0 0 0  Difficult doing work/chores Not difficult at all Not difficult at all Not difficult at all      01/05/2024   10:07 AM 11/23/2023    8:18 AM 07/14/2023    9:15 AM 01/13/2023    9:07 AM  GAD 7 : Generalized Anxiety Score  Nervous, Anxious, on Edge 0 0 0 0  Control/stop worrying 0 0 0 0  Worry too much - different things 0 0 0 0  Trouble relaxing 0 0 0 0  Restless 0 0 0 0  Easily annoyed or irritable 0 0 0 0  Afraid - awful might happen 0 0 0 0  Total GAD 7 Score 0 0 0 0  Anxiety Difficulty Not difficult at all Not difficult at all Not difficult at all Not difficult at all   SDOH Screenings   Depression (PHQ2-9): Low Risk  (01/05/2024)  Tobacco Use: Low Risk  (01/05/2024)     Physical Exam Constitutional:      General: She is not in acute distress.    Appearance: Normal appearance.  HENT:     Head: Normocephalic and atraumatic.     Right Ear: Tympanic membrane normal. There is no impacted cerumen.     Left Ear: Tympanic membrane normal. There is no impacted cerumen.     Mouth/Throat:     Mouth: Mucous membranes are moist.  Neck:     Thyroid : No thyroid  mass or thyroid  tenderness.  Cardiovascular:     Rate and Rhythm: Normal rate and regular rhythm.  Pulmonary:     Effort: Pulmonary effort is normal.     Breath sounds: Normal breath sounds.  Abdominal:     General: Bowel sounds are normal.     Palpations: Abdomen is soft.     Tenderness: There is no guarding or rebound.  Musculoskeletal:     Cervical back: Neck supple. No rigidity.     Right lower leg: No edema.     Left lower leg: No edema.  Skin:    General: Skin is warm.  Neurological:     Mental Status: She is alert and oriented to person, place, and time.  Psychiatric:        Mood and Affect: Mood normal.        Behavior: Behavior normal.  Thought Content: Thought  content does not include suicidal ideation. Thought content does not include suicidal plan.        No results found for any visits on 01/05/24.  The 10-year ASCVD risk score (Arnett DK, et al., 2019) is: 33.1%     Assessment & Plan:   Assessment & Plan Essential hypertension, benign Blood pressure well-controlled with current regimen. No medication adjustment needed.  - Continue current antihypertensive regimen Amlodipine  10 mg, Losartan  12.5 mg daily.  - Monitor blood pressure at home once or twice a week. - Bring home blood pressure readings to next appointment. - Check CMP today.  Orders:   amLODipine  (NORVASC ) 10 MG tablet; Take 1 tablet (10 mg total) by mouth daily.   losartan  (COZAAR ) 25 MG tablet; Take 0.5 tablets (12.5 mg total) by mouth daily.  Mood disorder Stable on Celexa  10 mg daily, continue. B12 previously low normal. Goal B12 around 400 recommended. Check B12 level today. Take B12 500 mcg daily.  Orders:   citalopram  (CELEXA ) 10 MG tablet; Take 1 tablet (10 mg total) by mouth daily.   B12  Hyperlipidemia, unspecified hyperlipidemia type Previous LDL elevated. Since lifestyle changes, regular exercise previous lipid panel has shown improvement in LDL. She is fasting. Check lipid panel today.  Orders:   Comp Met (CMET)   Lipid panel  Abnormal glucose Known history of prediabetes. Check A1c. Orders:   HgB A1c  Calculus of gallbladder without cholecystitis without obstruction Gallstones identified on MRI done on 12/04/23. Monitoring preferred. - Monitor for symptoms such as right upper quadrant pain after meals. - Consider elective cholecystectomy if symptoms develop.    Pancreatic cyst Incidental. Noted on MRI abdomen on 12/04/23. She is undergoing MRI abdomen with urologist in 6 months. If changed will discuss results and potential referral to GI in the future.      I personally spent a total of 45 minutes in the care of the patient today including  preparing to see the patient, performing a medically appropriate exam/evaluation, counseling and educating, placing orders, documenting clinical information in the EHR, independently interpreting results, and communicating results.  Return in about 6 months (around 07/04/2024) for Chronic follow up .   Luke Shade, MD

## 2024-01-05 NOTE — Assessment & Plan Note (Signed)
 Incidental. Noted on MRI abdomen on 12/04/23. She is undergoing MRI abdomen with urologist in 6 months. If changed will discuss results and potential referral to GI in the future.

## 2024-01-05 NOTE — Assessment & Plan Note (Addendum)
 Stable on Celexa  10 mg daily, continue. B12 previously low normal. Goal B12 around 400 recommended. Check B12 level today. Take B12 500 mcg daily.  Orders:   citalopram  (CELEXA ) 10 MG tablet; Take 1 tablet (10 mg total) by mouth daily.   B12

## 2024-01-05 NOTE — Assessment & Plan Note (Addendum)
 Previous LDL elevated. Since lifestyle changes, regular exercise previous lipid panel has shown improvement in LDL. She is fasting. Check lipid panel today.  Orders:   Comp Met (CMET)   Lipid panel

## 2024-01-05 NOTE — Patient Instructions (Signed)
 Follow up with Dr. Abbey in 6 months.  Take B12 supplement 500 mcg daily.  Continue current medications as it is.

## 2024-01-05 NOTE — Assessment & Plan Note (Signed)
 Gallstones identified on MRI done on 12/04/23. Monitoring preferred. - Monitor for symptoms such as right upper quadrant pain after meals. - Consider elective cholecystectomy if symptoms develop.

## 2024-01-05 NOTE — Assessment & Plan Note (Addendum)
 Known history of prediabetes. Check A1c. Orders:   HgB A1c

## 2024-01-06 ENCOUNTER — Ambulatory Visit: Payer: Self-pay

## 2024-01-06 LAB — VITAMIN B12: Vitamin B-12: 248 pg/mL (ref 211–911)

## 2024-01-07 ENCOUNTER — Other Ambulatory Visit: Payer: Self-pay

## 2024-01-07 NOTE — Telephone Encounter (Signed)
 Patient medication list has been updated to reflect added medication.

## 2024-01-14 DIAGNOSIS — C44729 Squamous cell carcinoma of skin of left lower limb, including hip: Secondary | ICD-10-CM | POA: Diagnosis not present

## 2024-02-07 ENCOUNTER — Telehealth: Payer: Self-pay

## 2024-02-07 ENCOUNTER — Ambulatory Visit
Admission: EM | Admit: 2024-02-07 | Discharge: 2024-02-07 | Disposition: A | Discharge status: Home / Self Care | Attending: Emergency Medicine | Admitting: Emergency Medicine

## 2024-02-07 DIAGNOSIS — J101 Influenza due to other identified influenza virus with other respiratory manifestations: Secondary | ICD-10-CM

## 2024-02-07 DIAGNOSIS — B9789 Other viral agents as the cause of diseases classified elsewhere: Secondary | ICD-10-CM

## 2024-02-07 DIAGNOSIS — J329 Chronic sinusitis, unspecified: Secondary | ICD-10-CM | POA: Diagnosis not present

## 2024-02-07 DIAGNOSIS — R062 Wheezing: Secondary | ICD-10-CM

## 2024-02-07 LAB — POCT RAPID STREP A (OFFICE): Rapid Strep A Screen: NEGATIVE

## 2024-02-07 LAB — POC COVID19/FLU A&B COMBO
Covid Antigen, POC: NEGATIVE
Influenza A Antigen, POC: POSITIVE — AB
Influenza B Antigen, POC: NEGATIVE

## 2024-02-07 MED ORDER — ALBUTEROL SULFATE HFA 108 (90 BASE) MCG/ACT IN AERS: 1.0000 | INHALATION_SPRAY | RESPIRATORY_TRACT | 0 refills | Status: AC | PRN

## 2024-02-07 MED ORDER — PROMETHAZINE-DM 6.25-15 MG/5ML PO SYRP: 5.0000 mL | ORAL_SOLUTION | Freq: Four times a day (QID) | ORAL | 0 refills | Status: AC | PRN

## 2024-02-07 MED ORDER — OSELTAMIVIR PHOSPHATE 30 MG PO CAPS: 30.0000 mg | ORAL_CAPSULE | Freq: Two times a day (BID) | ORAL | 0 refills | Status: AC

## 2024-02-07 MED ORDER — AEROCHAMBER MV MISC: 1 refills | Status: AC

## 2024-02-07 MED ORDER — OSELTAMIVIR PHOSPHATE 75 MG PO CAPS: ORAL_CAPSULE | ORAL | 0 refills | Status: AC

## 2024-02-07 NOTE — Discharge Instructions (Signed)
 Influenza A positive.  COVID, RSV, strep negative.  Take the Tamiflu  75 mg today, then start 30 mg twice a day tomorrow and take that until it is finished.  2 puffs from your albuterol  inhaler using your spacer every 4-6 hours.  Have the pharmacist show you how to use your spacer.  Promethazine  DM for cough.  Flonase, saline nasal irrigation with NeilMed sinus rinse and distilled water as often as you want to prevent a bacterial sinus faction, Mucinex.  Unfortunately, you cannot take ibuprofen due to your kidney function.  You may take Tylenol  1000 mg 3-4 times a day as needed for body aches, headaches.

## 2024-02-07 NOTE — Telephone Encounter (Signed)
 Pharmacist called to confirm the right dosage for Tamiflu . States that she received Tamiflu  75 mg x 1 dose. Tamiflu  Take 1 capsule (30 mg total) by mouth every 12 (twelve) hours for 4 days. Take 30 mg twice a day on days 2 through 5. Wanted to ask why that was sent in. Spoke with Dr. Mortenson and she stated that patient has renal insuffiencey. Pharmacist is aware and verbalized understanding.

## 2024-02-07 NOTE — ED Provider Notes (Signed)
 HPI  SUBJECTIVE:  Molly Gregory is a 73 y.o. female who presents with fevers Tmax 100.7, fatigue, sinus pain and pressure, postnasal drip, cough productive of clear thick sputum, chest congestion, left ear itching starting yesterday.  She is waking up at night coughing.  Her grandson has been sick with an unknown illness and is currently on antibiotics.  She took care of him this weekend.  No body aches, wheezing or shortness of breath, dyspnea on exertion.  She has had a headache, nasal congestion or rhinorrhea for the past 5 days, but this also got worse yesterday.  She did not get the flu vaccine.  She has tried Tylenol  and Robitussin.  The Robitussin helps.  No aggravating factors.  No antipyretic in the past 6 hours.  She has a past medical history of bronchitis, hypertension, migraines, hyperlipidemia, psoriasis on Otezla .   Past Medical History:  Diagnosis Date   Allergy    Anxiety    Arthritis    Atypical chest pain 12/04/2015   Diverticulosis    H/O exercise stress test    normal   H/O psoriasis    Head injury, closed 2013   Hiatal hernia    History of chicken pox    Hx of acute bronchitis    Hx of migraines    Hyperlipidemia    Hypertension    Irregular heart beat    Kidney stone    Vitamin B deficiency 07/19/2023   Vitamin D  deficiency 07/19/2023    Past Surgical History:  Procedure Laterality Date   CARPAL TUNNEL RELEASE Bilateral    both hands, Dr. Arletta   CATARACT EXTRACTION Left    COLONOSCOPY WITH PROPOFOL  N/A 04/24/2020   Procedure: COLONOSCOPY WITH PROPOFOL ;  Surgeon: Jinny Carmine, MD;  Location: [facility] ENDOSCOPY;  Service: Endoscopy;  Laterality: N/A;  COVID POSITIVE ON 04/09/2020 - ASYMPTOMATIC   COLONOSCOPY WITH PROPOFOL  N/A 04/23/2021   Procedure: COLONOSCOPY WITH PROPOFOL ;  Surgeon: Jinny Carmine, MD;  Location: [facility] ENDOSCOPY;  Service: Endoscopy;  Laterality: N/A;   EXTRACORPOREAL SHOCK WAVE LITHOTRIPSY Right 05/14/2023   Procedure: LITHOTRIPSY, ESWL;   Surgeon: Twylla Glendia BROCKS, MD;  Location: [facility] ORS;  Service: Urology;  Laterality: Right;   EYE SURGERY Left    cornea transplant, Dr. Lenn   TRIGGER FINGER RELEASE Bilateral    TUBAL LIGATION      Family History  Problem Relation Age of Onset   Hypertension Maternal Aunt    Aneurysm Maternal Aunt    Kidney failure Maternal Aunt    Diabetes Maternal Grandmother    Heart disease Maternal Grandmother    Hypertension Maternal Grandmother    Diabetes Paternal Grandmother    Heart disease Paternal Grandmother    Hypertension Paternal Grandmother    Kidney cancer Cousin    Bladder Cancer Neg Hx     Social History   Tobacco Use   Smoking status: Never   Smokeless tobacco: Never  Vaping Use   Vaping status: Never Used  Substance Use Topics   Alcohol use: No   Drug use: No    No current facility-administered medications for this encounter.  Current Outpatient Medications:    albuterol  (VENTOLIN  HFA) 108 (90 Base) MCG/ACT inhaler, Inhale 1-2 puffs into the lungs every 4 (four) hours as needed for wheezing or shortness of breath., Disp: 1 each, Rfl: 0   oseltamivir  (TAMIFLU ) 30 MG capsule, Take 1 capsule (30 mg total) by mouth every 12 (twelve) hours for 4 days. Take 30 mg twice a  day on days 2 through 5, Disp: 8 capsule, Rfl: 0   oseltamivir  (TAMIFLU ) 75 MG capsule, 75 mg x 1 dose today., Disp: 1 capsule, Rfl: 0   promethazine -dextromethorphan (PROMETHAZINE -DM) 6.25-15 MG/5ML syrup, Take 5 mLs by mouth 4 (four) times daily as needed for cough., Disp: 118 mL, Rfl: 0   Spacer/Aero-Holding Chambers (AEROCHAMBER MV) inhaler, Use as instructed, Disp: 1 each, Rfl: 1   Alpha Lipoic Acid 200 MG CAPS, Take 200 mg by mouth in the morning and at bedtime., Disp: , Rfl:    amLODipine  (NORVASC ) 10 MG tablet, Take 1 tablet (10 mg total) by mouth daily., Disp: 90 tablet, Rfl: 3   ascorbic acid (VITAMIN C) 500 MG tablet, Take 500 mg by mouth daily., Disp: , Rfl:    Calcium  Carbonate (CALCIUM   600 PO), Take by mouth daily., Disp: , Rfl:    Cholecalciferol (VITAMIN D3) 25 MCG (1000 UT) CAPS, Take by mouth., Disp: , Rfl:    citalopram  (CELEXA ) 10 MG tablet, Take 1 tablet (10 mg total) by mouth daily., Disp: 90 tablet, Rfl: 3   losartan  (COZAAR ) 25 MG tablet, Take 0.5 tablets (12.5 mg total) by mouth daily., Disp: 45 tablet, Rfl: 3   Niacinamide-Zn-Cu-Methfo-Se-Cr (NICOTINAMIDE) 750-27-2-0.5 MG TABS, Take 500 mg by mouth 2 (two) times daily., Disp: , Rfl:    OTEZLA  30 MG TABS, Take 1 tablet by mouth 2 (two) times daily., Disp: , Rfl:    OVER THE COUNTER MEDICATION, Qunal Extra Strength 420 mg, Disp: , Rfl:    prednisoLONE acetate (PRED FORTE) 1 % ophthalmic suspension, 1 drop 4 (four) times daily., Disp: , Rfl:    psyllium (METAMUCIL) 58.6 % packet, Take 1 packet by mouth daily. , Disp: , Rfl:    triamcinolone cream (KENALOG) 0.1 %, Apply 1 application topically 2 (two) times daily. , Disp: , Rfl:   Allergies  Allergen Reactions   Penicillins      ROS  As noted in HPI.   Physical Exam  BP 136/71 (BP Location: Right Arm)   Pulse 83   Temp 99.5 F (37.5 C) (Oral)   Resp 18   Wt 57.4 kg   SpO2 95%   BMI 22.41 kg/m   Constitutional: Well developed, well nourished, no acute distress Eyes: PERRL, EOMI, conjunctiva normal bilaterally HENT: Normocephalic, atraumatic,mucus membranes moist.  TMs normal bilaterally.  Clear nasal congestion.  Normal turbinates.  Positive mild maxillary, frontal sinus tenderness.  Positive cobblestoning postnasal drip. Neck: Positive tender cervical lymphadenopathy.  No meningismus. Respiratory: Scattered wheezing bilaterally, worse on the right.  Good air movement.  Positive anterior, lateral chest wall tenderness. Cardiovascular: Normal rate and rhythm, no murmurs, no gallops, no rubs GI: nondistended skin: No rash, skin intact Musculoskeletal: no deformities Neurologic: Alert & oriented x 3, CN III-XII grossly intact, no motor deficits,  sensation grossly intact Psychiatric: Speech and behavior appropriate   ED Course   Medications - No data to display  Orders Placed This Encounter  Procedures   POC rapid strep A    Standing Status:   Standing    Number of Occurrences:   1   POC Covid19/Flu A&B Antigen    Standing Status:   Standing    Number of Occurrences:   1   Results for orders placed or performed during the hospital encounter of 02/07/24 (from the past 24 hours)  POC rapid strep A     Status: Normal   Collection Time: 02/07/24  8:56 AM  Result Value Ref  Range   Rapid Strep A Screen Negative Negative  POC Covid19/Flu A&B Antigen     Status: Abnormal   Collection Time: 02/07/24  8:56 AM  Result Value Ref Range   Influenza A Antigen, POC Positive (A) Negative   Influenza B Antigen, POC Negative Negative   Covid Antigen, POC Negative Negative   No results found.  ED Clinical Impression  1. Influenza A   2. Viral sinusitis   3. Wheezing      ED Assessment/Plan     Influenza A positive.  COVID, strep negative.  Discussed all results with patient while in department.  Patient was having some nasal congestion starting 5 days ago, but she has whole new constellation of symptoms after taking care of her grandson.  I am suspecting that she got influenza from him, so we will treat her with Tamiflu .  She does have some wheezing.  2 puffs from albuterol  inhaler with a spacer every 4-6 hours, Promethazine  DM, Mucinex and saline nasal irrigation, Tylenol  1000 mg 3-4 times a day as needed for pain.  I considered prednisone , but have decided against that.  Deferring chest x-ray at this time as I doubt bacterial pneumonia.  Outside labs reviewed.  Calculated creatinine clearance on labs done in November 2025 52 mL/min.  Patient will not be able to take ibuprofen.  Will have to renally adjust Tamiflu  to 75 mg for 1 dose then 30 mg twice daily.    Follow-up with PCP if not getting better in 3 to 4 days.  ER return  precautions given.  Discussed labs,  MDM, treatment plan, and plan for follow-up with patient Discussed sn/sx that should prompt return to the ED. patient agrees with plan.   Meds ordered this encounter  Medications   albuterol  (VENTOLIN  HFA) 108 (90 Base) MCG/ACT inhaler    Sig: Inhale 1-2 puffs into the lungs every 4 (four) hours as needed for wheezing or shortness of breath.    Dispense:  1 each    Refill:  0   promethazine -dextromethorphan (PROMETHAZINE -DM) 6.25-15 MG/5ML syrup    Sig: Take 5 mLs by mouth 4 (four) times daily as needed for cough.    Dispense:  118 mL    Refill:  0   oseltamivir  (TAMIFLU ) 75 MG capsule    Sig: 75 mg x 1 dose today.    Dispense:  1 capsule    Refill:  0   Spacer/Aero-Holding Chambers (AEROCHAMBER MV) inhaler    Sig: Use as instructed    Dispense:  1 each    Refill:  1   oseltamivir  (TAMIFLU ) 30 MG capsule    Sig: Take 1 capsule (30 mg total) by mouth every 12 (twelve) hours for 4 days. Take 30 mg twice a day on days 2 through 5    Dispense:  8 capsule    Refill:  0      *This clinic note was created using Scientist, clinical (histocompatibility and immunogenetics). Therefore, there may be occasional mistakes despite careful proofreading. ?    Van Knee, MD 02/08/24 1151

## 2024-02-07 NOTE — ED Triage Notes (Signed)
 Was taking care of grandson Head pain- on top of head and back of head Nasal congestion Fever of 100.7 F Achy, fatigue, sleeping more often  Tylenol   Sore throat

## 2024-03-14 LAB — OPHTHALMOLOGY REPORT-SCANNED

## 2024-06-07 ENCOUNTER — Ambulatory Visit: Admitting: Urology

## 2024-07-05 ENCOUNTER — Ambulatory Visit
